# Patient Record
Sex: Male | Born: 1935 | Race: White | Hispanic: No | Smoking: Former smoker
Health system: Southern US, Community
[De-identification: ages and names within clinical notes are randomized; demographics above are authoritative.]

## PROBLEM LIST (undated history)

## (undated) DIAGNOSIS — E785 Hyperlipidemia, unspecified: Secondary | ICD-10-CM

## (undated) DIAGNOSIS — N400 Enlarged prostate without lower urinary tract symptoms: Secondary | ICD-10-CM

## (undated) DIAGNOSIS — D509 Iron deficiency anemia, unspecified: Secondary | ICD-10-CM

## (undated) DIAGNOSIS — E119 Type 2 diabetes mellitus without complications: Secondary | ICD-10-CM

## (undated) DIAGNOSIS — I4891 Unspecified atrial fibrillation: Secondary | ICD-10-CM

## (undated) DIAGNOSIS — E039 Hypothyroidism, unspecified: Secondary | ICD-10-CM

## (undated) DIAGNOSIS — I10 Essential (primary) hypertension: Secondary | ICD-10-CM

## (undated) HISTORY — PX: PACEMAKER PLACEMENT: SHX43

## (undated) HISTORY — PX: APPENDECTOMY: SHX54

## (undated) HISTORY — PX: CHOLECYSTECTOMY: SHX55

## (undated) HISTORY — PX: TONSILLECTOMY: SUR1361

---

## 2020-09-08 ENCOUNTER — Encounter: Payer: Self-pay | Admitting: Emergency Medicine

## 2020-09-08 ENCOUNTER — Emergency Department
Admission: EM | Admit: 2020-09-08 | Discharge: 2020-09-08 | Disposition: A | Discharge status: Home / Self Care | Payer: Medicare Other | Source: Home / Self Care | Attending: Emergency Medicine | Admitting: Emergency Medicine

## 2020-09-08 ENCOUNTER — Other Ambulatory Visit: Payer: Self-pay

## 2020-09-08 ENCOUNTER — Emergency Department: Payer: Medicare Other

## 2020-09-08 DIAGNOSIS — N99820 Postprocedural hemorrhage and hematoma of a genitourinary system organ or structure following a genitourinary system procedure: Secondary | ICD-10-CM | POA: Diagnosis not present

## 2020-09-08 DIAGNOSIS — R310 Gross hematuria: Secondary | ICD-10-CM | POA: Diagnosis not present

## 2020-09-08 DIAGNOSIS — R319 Hematuria, unspecified: Secondary | ICD-10-CM

## 2020-09-08 DIAGNOSIS — S91115A Laceration without foreign body of left lesser toe(s) without damage to nail, initial encounter: Secondary | ICD-10-CM | POA: Insufficient documentation

## 2020-09-08 DIAGNOSIS — Z8546 Personal history of malignant neoplasm of prostate: Secondary | ICD-10-CM | POA: Diagnosis not present

## 2020-09-08 DIAGNOSIS — E119 Type 2 diabetes mellitus without complications: Secondary | ICD-10-CM | POA: Insufficient documentation

## 2020-09-08 DIAGNOSIS — I10 Essential (primary) hypertension: Secondary | ICD-10-CM | POA: Insufficient documentation

## 2020-09-08 DIAGNOSIS — Z20822 Contact with and (suspected) exposure to covid-19: Secondary | ICD-10-CM | POA: Insufficient documentation

## 2020-09-08 DIAGNOSIS — N3090 Cystitis, unspecified without hematuria: Secondary | ICD-10-CM | POA: Insufficient documentation

## 2020-09-08 DIAGNOSIS — Z7982 Long term (current) use of aspirin: Secondary | ICD-10-CM | POA: Insufficient documentation

## 2020-09-08 DIAGNOSIS — D62 Acute posthemorrhagic anemia: Secondary | ICD-10-CM | POA: Diagnosis not present

## 2020-09-08 DIAGNOSIS — Z7901 Long term (current) use of anticoagulants: Secondary | ICD-10-CM | POA: Insufficient documentation

## 2020-09-08 DIAGNOSIS — W228XXA Striking against or struck by other objects, initial encounter: Secondary | ICD-10-CM | POA: Insufficient documentation

## 2020-09-08 DIAGNOSIS — S91312A Laceration without foreign body, left foot, initial encounter: Secondary | ICD-10-CM

## 2020-09-08 DIAGNOSIS — Z8679 Personal history of other diseases of the circulatory system: Secondary | ICD-10-CM | POA: Insufficient documentation

## 2020-09-08 HISTORY — DX: Type 2 diabetes mellitus without complications: E11.9

## 2020-09-08 HISTORY — DX: Essential (primary) hypertension: I10

## 2020-09-08 LAB — C-REACTIVE PROTEIN: CRP: 0.5 mg/dL (ref <1.0)

## 2020-09-08 LAB — RESP PANEL BY RT-PCR (FLU A&B, COVID) ARPGX2
Influenza A by PCR: NEGATIVE
Influenza B by PCR: NEGATIVE
SARS Coronavirus 2 by RT PCR: NEGATIVE

## 2020-09-08 LAB — COMPREHENSIVE METABOLIC PANEL
ALT: 17 U/L (ref 0–44)
AST: 20 U/L (ref 15–41)
Albumin: 3.5 g/dL (ref 3.5–5.0)
Alkaline Phosphatase: 73 U/L (ref 38–126)
Anion gap: 9 (ref 5–15)
BUN: 21 mg/dL (ref 8–23)
CO2: 26 mmol/L (ref 22–32)
Calcium: 9.2 mg/dL (ref 8.9–10.3)
Chloride: 103 mmol/L (ref 98–111)
Creatinine, Ser: 0.98 mg/dL (ref 0.61–1.24)
GFR, Estimated: >60 mL/min (ref >60)
Glucose, Bld: 180 mg/dL — ABNORMAL HIGH (ref 70–99)
Potassium: 4.5 mmol/L (ref 3.5–5.1)
Sodium: 138 mmol/L (ref 135–145)
Total Bilirubin: 0.5 mg/dL (ref 0.3–1.2)
Total Protein: 6.8 g/dL (ref 6.5–8.1)

## 2020-09-08 LAB — CBC WITH DIFFERENTIAL/PLATELET
Abs Immature Granulocytes: 0.06 10*3/uL (ref 0.00–0.07)
Basophils Absolute: 0.1 10*3/uL (ref 0.0–0.1)
Basophils Relative: 1 %
Eosinophils Absolute: 0.2 10*3/uL (ref 0.0–0.5)
Eosinophils Relative: 1 %
HCT: 30 % — ABNORMAL LOW (ref 39.0–52.0)
Hemoglobin: 9.3 g/dL — ABNORMAL LOW (ref 13.0–17.0)
Immature Granulocytes: 1 %
Lymphocytes Relative: 6 %
Lymphs Abs: 0.7 10*3/uL (ref 0.7–4.0)
MCH: 27.8 pg (ref 26.0–34.0)
MCHC: 31 g/dL (ref 30.0–36.0)
MCV: 89.6 fL (ref 80.0–100.0)
Monocytes Absolute: 0.8 10*3/uL (ref 0.1–1.0)
Monocytes Relative: 7 %
Neutro Abs: 9.2 10*3/uL — ABNORMAL HIGH (ref 1.7–7.7)
Neutrophils Relative %: 84 %
Platelets: 272 10*3/uL (ref 150–400)
RBC: 3.35 MIL/uL — ABNORMAL LOW (ref 4.22–5.81)
RDW: 15.6 % — ABNORMAL HIGH (ref 11.5–15.5)
WBC: 10.9 10*3/uL — ABNORMAL HIGH (ref 4.0–10.5)
nRBC: 0 % (ref 0.0–0.2)

## 2020-09-08 LAB — URINALYSIS, COMPLETE (UACMP) WITH MICROSCOPIC
RBC / HPF: >50 RBC/hpf — ABNORMAL HIGH (ref 0–5)
Specific Gravity, Urine: 1.017 (ref 1.005–1.030)
Squamous Epithelial / HPF: NONE SEEN (ref 0–5)
WBC, UA: >50 WBC/hpf — ABNORMAL HIGH (ref 0–5)

## 2020-09-08 LAB — LACTIC ACID, PLASMA: Lactic Acid, Venous: 1.1 mmol/L (ref 0.5–1.9)

## 2020-09-08 LAB — SEDIMENTATION RATE: Sed Rate: 39 mm/hr — ABNORMAL HIGH (ref 0–16)

## 2020-09-08 LAB — PROTIME-INR
INR: 1.2 (ref 0.8–1.2)
Prothrombin Time: 15.1 seconds (ref 11.4–15.2)

## 2020-09-08 MED ORDER — CEFDINIR 300 MG PO CAPS: 300.0000 mg | ORAL_CAPSULE | Freq: Two times a day (BID) | ORAL | 0 refills | Status: DC

## 2020-09-08 NOTE — ED Triage Notes (Signed)
EMS brings pt in from [facility] for "infected left toe" since March; this morning he "kicked his walker and it began bleeding"

## 2020-09-08 NOTE — ED Notes (Signed)
Pt to ER with urinary catheter in place. Bright red blood in urine. Leg bag changed to large drainage bag, hanging from bedside.

## 2020-09-08 NOTE — ED Notes (Signed)
Spoke to Dr. Scotty Court, who Dr. Marisa Severin signed out to, about continued bright red urine. MD verbalized understanding. See orders.  States still okay to d/c.

## 2020-09-08 NOTE — Discharge Instructions (Addendum)
Your urinalysis suggests an underlying bladder infection.  We replaced your foley catheter, and you should start antibiotics for the next week to ensure that any infection is resolved.  We sent a urine culture to the lab to confirm this in the next few days.  Continue routine wound care of the left foot as you are doing.  Any small cut or laceration on the foot is at high risk of not healing well due to your vascular issues so you will need to keep a close eye on the wound.  Follow-up with your primary care doctor, vascular surgeon, and urologist within the next 1 to 2 weeks.  Return to the ER for new, worsening, or persistent bleeding from the foot, persistent blood in the urine, redness or pain going up the leg, fever chills, weakness, or any other new or worsening symptoms that concern you.

## 2020-09-08 NOTE — ED Triage Notes (Signed)
Pt reports the wound on his left foot is bleeding. Bleeding controlled at this time. Pt states he has a wound on his little toe left foot but he continues having issues with it. Pt also reports he needs his foley catheter replaced.   Pt unsure of allergies or past medical hx. Pt requesting that MD look at his [facility] chart to see them. Pt does take eloquis he thinks

## 2020-09-08 NOTE — ED Provider Notes (Signed)
[facility] Emergency Department Provider Note ____________________________________________   Event Date/Time   First MD Initiated Contact with Patient 09/08/20 1034     (approximate)  I have reviewed the triage vital signs and the nursing notes.   HISTORY  Chief Complaint Toe Injury    HPI [name] [name] is a 86 y.o. male with PMH as noted below as well as a history of peripheral artery disease who presents with bleeding from his left fourth toe after he bumped it into something.  The patient states that he has had a chronic wound on the fourth toe and chronic pain to the fifth toe.  He has been treated and followed extensively at [facility] for this.  He reports that he had an arterial blockage that required a vascular procedure, and has been on blood thinners, currently Eliquis and aspirin.  He reports some increased generalized bruising and hematuria but denies other abnormal bleeding.  The patient states that the fifth toe is sore but denies other acute pain.  In addition he reports that he has an indwelling Foley catheter which has recently become irritated over the last few days.  He previously had hematuria that had resolved, but it has now returned.  He denies any dysuria, abdominal pain, or fever.  Past Medical History:  Diagnosis Date  . Diabetes mellitus without complication (HCC)   . Hypertension     There are no problems to display for this patient.   History reviewed. No pertinent surgical history.  Prior to Admission medications   Medication Sig Start Date End Date Taking? Authorizing Provider  cefdinir (OMNICEF) 300 MG capsule Take 1 capsule (300 mg total) by mouth 2 (two) times daily. 09/08/20   Sharman Cheek, MD    Allergies Statins  No family history on file.  Social History Lives in assisted living  Review of Systems  Constitutional: No fever/chills. Eyes: No visual changes. ENT: No sore throat. Cardiovascular: Denies chest  pain. Respiratory: Denies shortness of breath. Gastrointestinal: No vomiting or diarrhea.  Genitourinary: Negative for dysuria.  Musculoskeletal: Negative for back pain.  Positive for left fourth and fifth toe pain. Skin: Negative for rash. Neurological: Negative for headaches, focal weakness or numbness.   ____________________________________________   PHYSICAL EXAM:  VITAL SIGNS: ED Triage Vitals  Enc Vitals Group     BP 09/08/20 0736 (!) 153/69     Pulse Rate 09/08/20 0736 (!) 59     Resp 09/08/20 0736 20     Temp 09/08/20 0738 (!) 97.4 F (36.3 C)     Temp Source 09/08/20 0738 Oral     SpO2 09/08/20 0718 99 %     Weight 09/08/20 0733 168 lb (76.2 kg)     Height 09/08/20 0733 5\' 11"  (1.803 m)     Head Circumference --      Peak Flow --      Pain Score 09/08/20 0733 0     Pain Loc --      Pain Edu? --      Excl. in GC? --     Constitutional: Alert and oriented. Well appearing for age and in no acute distress. Eyes: Conjunctivae are normal.  Head: Atraumatic. Nose: No congestion/rhinnorhea. Mouth/Throat: Mucous membranes are moist.   Neck: Normal range of motion.  Cardiovascular: Normal rate, regular rhythm.  Good peripheral circulation. Respiratory: Normal respiratory effort.  No retractions.  Gastrointestinal: No distention.  Musculoskeletal: No lower extremity edema.  Extremities warm and well perfused.  Superficial skin  avulsion just underneath the toenail of the left fourth toe with no active bleeding.  No other visible open wounds or lesions. Neurologic:  Normal speech and language. No gross focal neurologic deficits are appreciated.  Skin:  Skin is warm and dry. No rash noted. Psychiatric: Mood and affect are normal. Speech and behavior are normal.  ____________________________________________   LABS (all labs ordered are listed, but only abnormal results are displayed)  Labs Reviewed  COMPREHENSIVE METABOLIC PANEL - Abnormal; Notable for the following  components:      Result Value   Glucose, Bld 180 (*)    All other components within normal limits  CBC WITH DIFFERENTIAL/PLATELET - Abnormal; Notable for the following components:   WBC 10.9 (*)    RBC 3.35 (*)    Hemoglobin 9.3 (*)    HCT 30.0 (*)    RDW 15.6 (*)    Neutro Abs 9.2 (*)    All other components within normal limits  URINALYSIS, COMPLETE (UACMP) WITH MICROSCOPIC - Abnormal; Notable for the following components:   Color, Urine RED (*)    APPearance CLOUDY (*)    Glucose, UA   (*)    Value: TEST NOT REPORTED DUE TO COLOR INTERFERENCE OF URINE PIGMENT   Hgb urine dipstick   (*)    Value: TEST NOT REPORTED DUE TO COLOR INTERFERENCE OF URINE PIGMENT   Bilirubin Urine   (*)    Value: TEST NOT REPORTED DUE TO COLOR INTERFERENCE OF URINE PIGMENT   Ketones, ur   (*)    Value: TEST NOT REPORTED DUE TO COLOR INTERFERENCE OF URINE PIGMENT   Protein, ur   (*)    Value: TEST NOT REPORTED DUE TO COLOR INTERFERENCE OF URINE PIGMENT   Nitrite   (*)    Value: TEST NOT REPORTED DUE TO COLOR INTERFERENCE OF URINE PIGMENT   Leukocytes,Ua   (*)    Value: TEST NOT REPORTED DUE TO COLOR INTERFERENCE OF URINE PIGMENT   RBC / HPF >50 (*)    WBC, UA >50 (*)    Bacteria, UA MANY (*)    All other components within normal limits  RESP PANEL BY RT-PCR (FLU A&B, COVID) ARPGX2  CULTURE, BLOOD (ROUTINE X 2)  CULTURE, BLOOD (ROUTINE X 2)  URINE CULTURE  LACTIC ACID, PLASMA  PROTIME-INR  C-REACTIVE PROTEIN  LACTIC ACID, PLASMA  SEDIMENTATION RATE   ____________________________________________  EKG   ____________________________________________  RADIOLOGY  XR L foot: Erosion in the fifth proximal phalanx concerning for osteomyelitis  ____________________________________________   PROCEDURES  Procedure(s) performed: Yes  .Marland KitchenLaceration Repair  Date/Time: 09/08/2020 4:18 PM Performed by: Dionne Bucy, MD Authorized by: Dionne Bucy, MD   Consent:    Consent  obtained:  Verbal   Consent given by:  Patient   Risks discussed:  Infection, pain, retained foreign body, poor cosmetic result and poor wound healing Universal protocol:    Patient identity confirmed:  Verbally with patient Anesthesia:    Anesthesia method:  None Laceration details:    Location:  Toe   Toe location:  L fourth toe   Length (cm):  1   Depth (mm):  3 Exploration:    Hemostasis achieved with:  Direct pressure   Wound extent: no foreign bodies/material noted, no underlying fracture noted and no vascular damage noted     Contaminated: no   Treatment:    Area cleansed with:  Povidone-iodine   Amount of cleaning:  Standard Skin repair:    Repair method:  Tissue adhesive Approximation:    Approximation:  Close Repair type:    Repair type:  Simple Post-procedure details:    Dressing:  Sterile dressing   Procedure completion:  Tolerated well, no immediate complications    Critical Care performed: No ____________________________________________   INITIAL IMPRESSION / ASSESSMENT AND PLAN / ED COURSE  Pertinent labs & imaging results that were available during my care of the patient were reviewed by me and considered in my medical decision making (see chart for details).  86 year old male with PMH as noted above presents with bleeding from the left fourth toe after he bumped it into something.  In addition he has irritation to his Foley catheter.  The patient reports an extensive history of peripheral vascular disease and a wound to his left toes for which she [facility].  He states he was diagnosed with osteomyelitis and was on antibiotics for some time but these were discontinued.  He also describes what sounds like a vascular procedure due to an occlusion.  He is on Eliquis and aspirin.  I attempted to review the past medical records.  The patient has [facility] in [facility].  Unfortunately, I am unable to open Care Everywhere to  obtain any records from [facility].  He recently was transferred to an assisted living in [place] which is why he presented here [facility] than to [facility].  On exam the patient is overall very well-appearing for his age.  His vital signs are normal except for hypertension.  There is a small skin avulsion underneath the toenail of the left fourth toe which is the source of bleeding and it has now stopped.  There is no visible open wound to the fifth toe.  X-ray was obtained from triage and is concerning for osteomyelitis.  However, I have no baseline to compare to.  We will obtain a lab work-up.  If this is acute or recurrent osteomyelitis the patient likely will require admission.  Once the work-up is complete I will likely contact the [facility] transfer center to discuss his case with the provider and determine the need for admission; if he needs to be admitted it seems more appropriate for the patient to be admitted there where he has been extensively followed, and the patient agrees that this is his preference.  If [facility] is at capacity and he requires admission, he will be admitted here.  ----------------------------------------- 3:42 PM on 09/08/2020 -----------------------------------------  After some time, the past records from [facility] did appear in care everywhere.  I was able to review them.  He was admitted in early December and started on Eliquis at that time.  He developed urinary retention and a Foley catheter was placed.  He was considered for revascularization but did not end up having surgery.  He was initially treated with IV antibiotics for osteomyelitis although then ID was consulted and the recommendation was to stop antibiotics, because the osteomyelitis appeared chronic and definitive treatment is surgical.  X-ray on 12/2 demonstrated osseous resorption in the proximal distal phalanx of the fifth toe suggestive of osteomyelitis.  This is the same as the findings on the x-ray from today.   He was seen in  the ED for hematuria on 12/9.  The patient has declined further work-up for hematuria including cystoscopy.  The patient's lab work-up here is reassuring.  He has no leukocytosis and his lactate is normal.  CRP is also normal.  His hemoglobin is stable from last month.  Given that the x-ray finding today is chronic, the patient has no new symptoms related to possible osteomyelitis, and the lab work-up is reassuring, there is no indication for admission.  I applied Dermabond to the small new wound on the toe in order to control the mild bleeding.  This area has been redressed.  The Foley catheter was replaced.  The patient continues to have pinkish blood-tinged urine out of the new Foley catheter, however there is no frank blood with clots.  Given his disinclination to have further work-up of the hematuria, and the stable hemoglobin, there is no indication for further acute intervention.  Based on these findings, at this time there is overall no indication for admission.  I had an extensive discussion with the patient as well as with the daughter over the phone about the results of the current work-up and the plan of care.  They both feel comfortable with discharge.  I gave both thorough return precautions and they both expressed understanding.  I have verified which specialists he will need to follow-up with and when.  I also specifically advised the patient that any new wound such as one sustained today is at higher risk of poor healing and infection and he should keep a close eye on it.  They understand to return to the ED immediately for any new or worsening symptoms.   ____________________________________________   FINAL CLINICAL IMPRESSION(S) / ED DIAGNOSES  Final diagnoses:  Foot laceration, left, initial encounter  Hematuria, unspecified type  Cystitis      NEW MEDICATIONS STARTED DURING THIS VISIT:  New Prescriptions   CEFDINIR (OMNICEF) 300 MG CAPSULE    Take 1 capsule (300 mg  total) by mouth 2 (two) times daily.     Note:  This document was prepared using Dragon voice recognition software and may include unintentional dictation errors.    Dionne Bucy, MD 09/08/20 1620

## 2020-09-10 ENCOUNTER — Inpatient Hospital Stay: Payer: Medicare Other

## 2020-09-10 ENCOUNTER — Emergency Department: Payer: Medicare Other

## 2020-09-10 ENCOUNTER — Inpatient Hospital Stay
Admission: EM | Admit: 2020-09-10 | Discharge: 2020-09-13 | DRG: 920 | Disposition: A | Discharge status: Home Health | Payer: Medicare Other | Source: Skilled Nursing Facility | Attending: Family Medicine | Admitting: Family Medicine

## 2020-09-10 ENCOUNTER — Other Ambulatory Visit: Payer: Self-pay

## 2020-09-10 ENCOUNTER — Encounter: Payer: Self-pay | Admitting: Emergency Medicine

## 2020-09-10 DIAGNOSIS — Y846 Urinary catheterization as the cause of abnormal reaction of the patient, or of later complication, without mention of misadventure at the time of the procedure: Secondary | ICD-10-CM | POA: Diagnosis present

## 2020-09-10 DIAGNOSIS — Z7982 Long term (current) use of aspirin: Secondary | ICD-10-CM | POA: Diagnosis not present

## 2020-09-10 DIAGNOSIS — D62 Acute posthemorrhagic anemia: Secondary | ICD-10-CM

## 2020-09-10 DIAGNOSIS — M7989 Other specified soft tissue disorders: Secondary | ICD-10-CM

## 2020-09-10 DIAGNOSIS — Z79890 Hormone replacement therapy (postmenopausal): Secondary | ICD-10-CM | POA: Diagnosis not present

## 2020-09-10 DIAGNOSIS — Z66 Do not resuscitate: Secondary | ICD-10-CM | POA: Diagnosis present

## 2020-09-10 DIAGNOSIS — Z7902 Long term (current) use of antithrombotics/antiplatelets: Secondary | ICD-10-CM | POA: Diagnosis not present

## 2020-09-10 DIAGNOSIS — Z7984 Long term (current) use of oral hypoglycemic drugs: Secondary | ICD-10-CM

## 2020-09-10 DIAGNOSIS — Z79899 Other long term (current) drug therapy: Secondary | ICD-10-CM

## 2020-09-10 DIAGNOSIS — E1169 Type 2 diabetes mellitus with other specified complication: Secondary | ICD-10-CM | POA: Diagnosis present

## 2020-09-10 DIAGNOSIS — N1831 Chronic kidney disease, stage 3a: Secondary | ICD-10-CM | POA: Diagnosis present

## 2020-09-10 DIAGNOSIS — Z888 Allergy status to other drugs, medicaments and biological substances status: Secondary | ICD-10-CM

## 2020-09-10 DIAGNOSIS — I10 Essential (primary) hypertension: Secondary | ICD-10-CM | POA: Diagnosis present

## 2020-09-10 DIAGNOSIS — E119 Type 2 diabetes mellitus without complications: Secondary | ICD-10-CM

## 2020-09-10 DIAGNOSIS — R319 Hematuria, unspecified: Secondary | ICD-10-CM

## 2020-09-10 DIAGNOSIS — E1151 Type 2 diabetes mellitus with diabetic peripheral angiopathy without gangrene: Secondary | ICD-10-CM | POA: Diagnosis present

## 2020-09-10 DIAGNOSIS — Z8546 Personal history of malignant neoplasm of prostate: Secondary | ICD-10-CM

## 2020-09-10 DIAGNOSIS — I129 Hypertensive chronic kidney disease with stage 1 through stage 4 chronic kidney disease, or unspecified chronic kidney disease: Secondary | ICD-10-CM | POA: Diagnosis present

## 2020-09-10 DIAGNOSIS — M86672 Other chronic osteomyelitis, left ankle and foot: Secondary | ICD-10-CM | POA: Diagnosis present

## 2020-09-10 DIAGNOSIS — R310 Gross hematuria: Secondary | ICD-10-CM | POA: Diagnosis present

## 2020-09-10 DIAGNOSIS — Z7901 Long term (current) use of anticoagulants: Secondary | ICD-10-CM

## 2020-09-10 DIAGNOSIS — Z20822 Contact with and (suspected) exposure to covid-19: Secondary | ICD-10-CM | POA: Diagnosis present

## 2020-09-10 DIAGNOSIS — I739 Peripheral vascular disease, unspecified: Secondary | ICD-10-CM | POA: Diagnosis present

## 2020-09-10 DIAGNOSIS — M86662 Other chronic osteomyelitis, left tibia and fibula: Secondary | ICD-10-CM | POA: Diagnosis present

## 2020-09-10 DIAGNOSIS — N99820 Postprocedural hemorrhage and hematoma of a genitourinary system organ or structure following a genitourinary system procedure: Secondary | ICD-10-CM | POA: Diagnosis present

## 2020-09-10 DIAGNOSIS — E039 Hypothyroidism, unspecified: Secondary | ICD-10-CM | POA: Diagnosis present

## 2020-09-10 DIAGNOSIS — E1122 Type 2 diabetes mellitus with diabetic chronic kidney disease: Secondary | ICD-10-CM | POA: Diagnosis present

## 2020-09-10 DIAGNOSIS — R911 Solitary pulmonary nodule: Secondary | ICD-10-CM | POA: Diagnosis present

## 2020-09-10 DIAGNOSIS — N401 Enlarged prostate with lower urinary tract symptoms: Secondary | ICD-10-CM | POA: Diagnosis present

## 2020-09-10 DIAGNOSIS — B9689 Other specified bacterial agents as the cause of diseases classified elsewhere: Secondary | ICD-10-CM | POA: Diagnosis present

## 2020-09-10 HISTORY — DX: Unspecified atrial fibrillation: I48.91

## 2020-09-10 HISTORY — DX: Hyperlipidemia, unspecified: E78.5

## 2020-09-10 LAB — COMPREHENSIVE METABOLIC PANEL
ALT: 13 U/L (ref 0–44)
AST: 18 U/L (ref 15–41)
Albumin: 3.1 g/dL — ABNORMAL LOW (ref 3.5–5.0)
Alkaline Phosphatase: 66 U/L (ref 38–126)
Anion gap: 8 (ref 5–15)
BUN: 33 mg/dL — ABNORMAL HIGH (ref 8–23)
CO2: 25 mmol/L (ref 22–32)
Calcium: 8.7 mg/dL — ABNORMAL LOW (ref 8.9–10.3)
Chloride: 102 mmol/L (ref 98–111)
Creatinine, Ser: 1.07 mg/dL (ref 0.61–1.24)
GFR, Estimated: >60 mL/min (ref >60)
Glucose, Bld: 171 mg/dL — ABNORMAL HIGH (ref 70–99)
Potassium: 4.3 mmol/L (ref 3.5–5.1)
Sodium: 135 mmol/L (ref 135–145)
Total Bilirubin: 0.5 mg/dL (ref 0.3–1.2)
Total Protein: 6.4 g/dL — ABNORMAL LOW (ref 6.5–8.1)

## 2020-09-10 LAB — CBC WITH DIFFERENTIAL/PLATELET
Abs Immature Granulocytes: 0.04 10*3/uL (ref 0.00–0.07)
Basophils Absolute: 0.1 10*3/uL (ref 0.0–0.1)
Basophils Relative: 1 %
Eosinophils Absolute: 0.1 10*3/uL (ref 0.0–0.5)
Eosinophils Relative: 2 %
HCT: 26 % — ABNORMAL LOW (ref 39.0–52.0)
Hemoglobin: 8.3 g/dL — ABNORMAL LOW (ref 13.0–17.0)
Immature Granulocytes: 0 %
Lymphocytes Relative: 6 %
Lymphs Abs: 0.6 10*3/uL — ABNORMAL LOW (ref 0.7–4.0)
MCH: 28.2 pg (ref 26.0–34.0)
MCHC: 31.9 g/dL (ref 30.0–36.0)
MCV: 88.4 fL (ref 80.0–100.0)
Monocytes Absolute: 0.8 10*3/uL (ref 0.1–1.0)
Monocytes Relative: 8 %
Neutro Abs: 8 10*3/uL — ABNORMAL HIGH (ref 1.7–7.7)
Neutrophils Relative %: 83 %
Platelets: 230 10*3/uL (ref 150–400)
RBC: 2.94 MIL/uL — ABNORMAL LOW (ref 4.22–5.81)
RDW: 15.8 % — ABNORMAL HIGH (ref 11.5–15.5)
WBC: 9.5 10*3/uL (ref 4.0–10.5)
nRBC: 0 % (ref 0.0–0.2)

## 2020-09-10 LAB — URINALYSIS, COMPLETE (UACMP) WITH MICROSCOPIC
Bilirubin Urine: NEGATIVE
Glucose, UA: 50 mg/dL — AB
Ketones, ur: NEGATIVE mg/dL
Leukocytes,Ua: NEGATIVE
Nitrite: NEGATIVE
Protein, ur: 100 mg/dL — AB
RBC / HPF: >50 RBC/hpf — ABNORMAL HIGH (ref 0–5)
Specific Gravity, Urine: 1.016 (ref 1.005–1.030)
Squamous Epithelial / HPF: NONE SEEN (ref 0–5)
pH: 5 (ref 5.0–8.0)

## 2020-09-10 LAB — GLUCOSE, CAPILLARY
Glucose-Capillary: 131 mg/dL — ABNORMAL HIGH (ref 70–99)
Glucose-Capillary: 182 mg/dL — ABNORMAL HIGH (ref 70–99)

## 2020-09-10 LAB — RESP PANEL BY RT-PCR (FLU A&B, COVID) ARPGX2
Influenza A by PCR: NEGATIVE
Influenza B by PCR: NEGATIVE
SARS Coronavirus 2 by RT PCR: NEGATIVE

## 2020-09-10 LAB — MRSA PCR SCREENING: MRSA by PCR: NEGATIVE

## 2020-09-10 MED ORDER — FERROUS SULFATE 325 (65 FE) MG PO TABS
325.0000 mg | ORAL_TABLET | ORAL | Status: DC
  Administered 2020-09-12: 09:00:00 325 mg via ORAL
  Filled 2020-09-10 (×3): qty 1

## 2020-09-10 MED ORDER — ACETAMINOPHEN 500 MG PO TABS: 500.0000 mg | ORAL_TABLET | Freq: Three times a day (TID) | ORAL | Status: DC | PRN

## 2020-09-10 MED ORDER — ONDANSETRON HCL 4 MG/2ML IJ SOLN: 4.0000 mg | Freq: Four times a day (QID) | INTRAMUSCULAR | Status: DC | PRN

## 2020-09-10 MED ORDER — SODIUM CHLORIDE 0.9 % IR SOLN
3000.0000 mL | Status: DC
  Administered 2020-09-10: 10:00:00 3000 mL

## 2020-09-10 MED ORDER — EZETIMIBE 10 MG PO TABS
10.0000 mg | ORAL_TABLET | Freq: Every day | ORAL | Status: DC
  Administered 2020-09-11 – 2020-09-13 (×3): 10 mg via ORAL
  Filled 2020-09-10 (×4): qty 1

## 2020-09-10 MED ORDER — TAMSULOSIN HCL 0.4 MG PO CAPS
0.4000 mg | ORAL_CAPSULE | Freq: Every day | ORAL | Status: DC
  Administered 2020-09-10 – 2020-09-12 (×3): 0.4 mg via ORAL
  Filled 2020-09-10 (×3): qty 1

## 2020-09-10 MED ORDER — SODIUM CHLORIDE 0.9% FLUSH: 3.0000 mL | INTRAVENOUS | Status: DC | PRN

## 2020-09-10 MED ORDER — LEVOTHYROXINE SODIUM 50 MCG PO TABS
125.0000 ug | ORAL_TABLET | Freq: Every day | ORAL | Status: DC
  Administered 2020-09-11 – 2020-09-13 (×3): 125 ug via ORAL
  Filled 2020-09-10 (×3): qty 1

## 2020-09-10 MED ORDER — FINASTERIDE 5 MG PO TABS
5.0000 mg | ORAL_TABLET | Freq: Every day | ORAL | Status: DC
  Administered 2020-09-11 – 2020-09-13 (×3): 5 mg via ORAL
  Filled 2020-09-10 (×3): qty 1

## 2020-09-10 MED ORDER — IOHEXOL 300 MG/ML  SOLN
125.0000 mL | Freq: Once | INTRAMUSCULAR | Status: AC | PRN
  Administered 2020-09-10: 14:00:00 125 mL via INTRAVENOUS

## 2020-09-10 MED ORDER — DILTIAZEM HCL ER BEADS 180 MG PO CP24: 180.0000 mg | ORAL_CAPSULE | Freq: Every day | ORAL | Status: DC

## 2020-09-10 MED ORDER — SODIUM CHLORIDE 0.9 % IV SOLN: 250.0000 mL | INTRAVENOUS | Status: DC | PRN

## 2020-09-10 MED ORDER — SODIUM CHLORIDE 0.9% FLUSH
3.0000 mL | Freq: Two times a day (BID) | INTRAVENOUS | Status: DC
  Administered 2020-09-10 – 2020-09-13 (×6): 3 mL via INTRAVENOUS

## 2020-09-10 MED ORDER — ADULT MULTIVITAMIN W/MINERALS CH
1.0000 | ORAL_TABLET | Freq: Every day | ORAL | Status: DC
  Administered 2020-09-11 – 2020-09-13 (×3): 1 via ORAL
  Filled 2020-09-10 (×3): qty 1

## 2020-09-10 MED ORDER — INSULIN ASPART 100 UNIT/ML ~~LOC~~ SOLN
0.0000 [IU] | Freq: Three times a day (TID) | SUBCUTANEOUS | Status: DC
  Administered 2020-09-11: 15:00:00 8 [IU] via SUBCUTANEOUS
  Administered 2020-09-12: 17:00:00 3 [IU] via SUBCUTANEOUS
  Administered 2020-09-12: 13:00:00 8 [IU] via SUBCUTANEOUS
  Administered 2020-09-13: 09:00:00 2 [IU] via SUBCUTANEOUS
  Administered 2020-09-13: 13:00:00 5 [IU] via SUBCUTANEOUS
  Filled 2020-09-10 (×6): qty 1

## 2020-09-10 MED ORDER — SODIUM CHLORIDE 0.9 % IR SOLN
3000.0000 mL | Status: DC
  Administered 2020-09-10: 14:00:00 3000 mL

## 2020-09-10 MED ORDER — VITAMIN D3 25 MCG (1000 UNIT) PO TABS
ORAL_TABLET | Freq: Every day | ORAL | Status: DC
  Administered 2020-09-11: 10:00:00 1000 [IU] via ORAL
  Administered 2020-09-12: 09:00:00 2000 [IU] via ORAL
  Administered 2020-09-13: 09:00:00 1000 [IU] via ORAL
  Filled 2020-09-10 (×2): qty 1
  Filled 2020-09-10 (×2): qty 2
  Filled 2020-09-10: qty 1
  Filled 2020-09-10: qty 2

## 2020-09-10 MED ORDER — VITAMIN B-12 1000 MCG PO TABS
1000.0000 ug | ORAL_TABLET | Freq: Every day | ORAL | Status: DC
  Administered 2020-09-11 – 2020-09-13 (×3): 1000 ug via ORAL
  Filled 2020-09-10 (×3): qty 1

## 2020-09-10 MED ORDER — ONDANSETRON HCL 4 MG PO TABS: 4.0000 mg | ORAL_TABLET | Freq: Four times a day (QID) | ORAL | Status: DC | PRN

## 2020-09-10 MED ORDER — DILTIAZEM HCL ER COATED BEADS 180 MG PO CP24
180.0000 mg | ORAL_CAPSULE | Freq: Every day | ORAL | Status: DC
  Administered 2020-09-11 – 2020-09-13 (×3): 180 mg via ORAL
  Filled 2020-09-10 (×3): qty 1

## 2020-09-10 NOTE — ED Notes (Signed)
Pt transported to CT at this time.

## 2020-09-10 NOTE — ED Notes (Signed)
This RN to reassess patient's urine. Pt urine is clearer than previous urine collection, small blood clots noted in the catheter bag. Pt states he feel much better.

## 2020-09-10 NOTE — ED Notes (Signed)
Pt being transferred to rm [id number] at this time via stretcher.

## 2020-09-10 NOTE — H&P (Signed)
History and Physical    [name] [name] NUU:[id number] DOB: 1928-08-19 DOA: 09/10/2020  PCP: Zenaida Niece, MD   Patient coming from: Assisted living facility ([facility])  I have personally briefly reviewed patient's old medical records in [facility]  Chief Complaint: Bloody urine  HPI: [name] [name] is a 86 y.o. male with medical history significant for peripheral arterial disease with chronic wound involving the left fourth toe for which he is on antibiotic therapy, history of  indwelling Foley catheter for urinary retention, diabetes mellitus and hypertension who was sent to the emergency room from the assisted living facility for evaluation of hematuria.  Patient states that he was seen in the emergency room 2 days prior to this hospitalization due to malfunctioning of his Foley catheter.  He states that he was leaking around the sides and had some blood in his urine.  His Foley catheter was replaced on 12/23 at [facility] and the patient was discharged back to his assisted living facility because he declined to have further work-up for his hematuria.  He was scheduled to follow-up with urology as an outpatient. Patient states he did okay for the last 24 hours and on the morning of his admission he developed hematuria again.  He denies having any trauma or pain. He denies feeling dizzy, no lightheadedness, no chest pain, no shortness of breath, no nausea, no vomiting, no diaphoresis, no palpitations, no abdominal pain, no constipation, no diarrhea, no fever, no chills, no cough, no headache. Labs show sodium 135, potassium 4.3, chloride 102, bicarb 25, glucose 171, BUN 33, creatinine 1.07, calcium 8.7, alkaline phosphatase 66, albumin 3.1, AST 18, ALT 13, total protein 6.4, white count 9.5, hemoglobin 8.3, hematocrit 26, MCV 88.4, RDW 15.8, platelet count 230 Respiratory viral panel is negative CT scan of the abdomen done with and without contrast shows hemorrhage within the bladder.  Bladder wall  thickening and mucosal hyperenhancement suspicious for cystitis.  A component of the wall thickening could be related to outlet obstruction in this patient with prostatomegaly. Posterior right middle lobe groundglass nodule of 2.4 cm.  This warrants follow-up chest CT in 6 months if patient is a therapy candidate.  Coronary artery disease and emphysema. Bilateral inguinal hernias. Respiratory viral panel is negative Left foot x-ray shows erosive changes in the distal aspect of the fifth proximal phalanx consistent with osteomyelitis.  ED Course: Patient is a 86 year old Caucasian male who resides in an assisted living facility and was brought to the ER for evaluation of gross hematuria.  Patient is on dual antiplatelet therapy. He is noted to have a 1 g drop in his H&H over the last 48 hours (9.3 >> 8.3).  He will be admitted to the hospital for further evaluation.  Review of Systems: As per HPI otherwise all systems reviewed and negative.    Past Medical History:  Diagnosis Date  . Diabetes mellitus without complication (HCC)   . Hypertension     History reviewed. No pertinent surgical history.   reports that he has quit smoking. He has never used smokeless tobacco. No history on file for alcohol use and drug use.  Allergies  Allergen Reactions  . Statins Palpitations    History reviewed. No pertinent family history.   Prior to Admission medications   Medication Sig Start Date End Date Taking? Authorizing Provider  acetaminophen (TYLENOL) 500 MG tablet Take 500 mg by mouth every 8 (eight) hours as needed.   Yes [provider]  ASPIRIN LOW DOSE 81  MG EC tablet Take 81 mg by mouth daily. 08/03/20  Yes [provider]  cefdinir (OMNICEF) 300 MG capsule Take 1 capsule (300 mg total) by mouth 2 (two) times daily. 09/08/20  Yes Sharman CheekStafford, Phillip, MD  Cholecalciferol 50 MCG (2000 UT) TABS Take 1,000 mg by mouth daily. 08/24/20  Yes [provider]  clopidogrel  (PLAVIX) 75 MG tablet Take 75 mg by mouth daily. 08/09/20  Yes [provider]  cyanocobalamin 1000 MCG tablet Take 1,000 mcg by mouth daily.   Yes [provider]  diltiazem (TIAZAC) 180 MG 24 hr capsule Take 180 mg by mouth daily. 09/07/20  Yes [provider]  ELIQUIS 5 MG TABS tablet Take 5 mg by mouth 2 (two) times daily. 08/24/20  Yes [provider]  ezetimibe (ZETIA) 10 MG tablet Take 10 mg by mouth daily. 08/09/20  Yes [provider]  FEROSUL 325 (65 Fe) MG tablet Take 1 tablet by mouth every other day. 08/07/20  Yes [provider]  finasteride (PROSCAR) 5 MG tablet Take 5 mg by mouth daily. 08/24/20  Yes [provider]  JANUVIA 50 MG tablet Take 50 mg by mouth daily. 09/06/20  Yes [provider]  levothyroxine (SYNTHROID) 125 MCG tablet Take 125 mcg by mouth daily before breakfast.   Yes [provider]  mineral oil-hydrophilic petrolatum (AQUAPHOR) ointment Apply 1 application topically daily. To feet after cleaning   Yes [provider]  Multiple Vitamin (MULTIVITAMIN WITH MINERALS) TABS tablet Take 1 tablet by mouth daily.   Yes [provider]  tamsulosin (FLOMAX) 0.4 MG CAPS capsule Take 0.4 mg by mouth at bedtime. 08/09/20  Yes [provider]    Physical Exam: Vitals:   09/10/20 1100 09/10/20 1130 09/10/20 1300 09/10/20 1557  BP: (!) 140/59 (!) 146/57 (!) 135/56 140/66  Pulse: 61 (!) 59 88 (!) 59  Resp:  18 20 17   Temp:    97.7 F (36.5 C)  TempSrc:    Oral  SpO2: 99% 99% 99% 100%  Weight:      Height:         Vitals:   09/10/20 1100 09/10/20 1130 09/10/20 1300 09/10/20 1557  BP: (!) 140/59 (!) 146/57 (!) 135/56 140/66  Pulse: 61 (!) 59 88 (!) 59  Resp:  18 20 17   Temp:    97.7 F (36.5 C)  TempSrc:    Oral  SpO2: 99% 99% 99% 100%  Weight:      Height:        Constitutional: NAD, alert and oriented x 3.  Appears comfortable and in no distress Eyes:  PERRL, lids and conjunctivae normal ENMT: Mucous membranes are moist.  Neck: normal, supple, no masses, no thyromegaly Respiratory: clear to auscultation bilaterally, no wheezing, no crackles. Normal respiratory effort. No accessory muscle use.  Cardiovascular: Regular rate and rhythm, no murmurs / rubs / gallops.  Left leg swelling compared to the right leg. 2+ pedal pulses. No carotid bruits.  Abdomen: no tenderness, no masses palpated. No hepatosplenomegaly. Bowel sounds positive.  Foley catheter in place with bloody urine Musculoskeletal: no clubbing / cyanosis.  Left fifth toe with crusted blood Skin: no rashes, lesions, ulcers.  Ecchymosis over both forearms Neurologic: No gross focal neurologic deficit. Psychiatric: Normal mood and affect.   Labs on Admission: I have personally reviewed following labs and imaging studies  CBC: Recent Labs  Lab 09/08/20 1103 09/10/20 0847  WBC 10.9* 9.5  NEUTROABS 9.2* 8.0*  HGB  9.3* 8.3*  HCT 30.0* 26.0*  MCV 89.6 88.4  PLT 272 230   Basic Metabolic Panel: Recent Labs  Lab 09/08/20 1103 09/10/20 0847  NA 138 135  K 4.5 4.3  CL 103 102  CO2 26 25  GLUCOSE 180* 171*  BUN 21 33*  CREATININE 0.98 1.07  CALCIUM 9.2 8.7*   GFR: Estimated Creatinine Clearance: 48.5 mL/min (by C-G formula based on SCr of 1.07 mg/dL). Liver Function Tests: Recent Labs  Lab 09/08/20 1103 09/10/20 0847  AST 20 18  ALT 17 13  ALKPHOS 73 66  BILITOT 0.5 0.5  PROT 6.8 6.4*  ALBUMIN 3.5 3.1*   No results for input(s): LIPASE, AMYLASE in the last 168 hours. No results for input(s): AMMONIA in the last 168 hours. Coagulation Profile: Recent Labs  Lab 09/08/20 1103  INR 1.2   Cardiac Enzymes: No results for input(s): CKTOTAL, CKMB, CKMBINDEX, TROPONINI in the last 168 hours. BNP (last 3 results) No results for input(s): PROBNP in the last 8760 hours. HbA1C: No results for input(s): HGBA1C in the last 72 hours. CBG: No results for input(s):  GLUCAP in the last 168 hours. Lipid Profile: No results for input(s): CHOL, HDL, LDLCALC, TRIG, CHOLHDL, LDLDIRECT in the last 72 hours. Thyroid Function Tests: No results for input(s): TSH, T4TOTAL, FREET4, T3FREE, THYROIDAB in the last 72 hours. Anemia Panel: No results for input(s): VITAMINB12, FOLATE, FERRITIN, TIBC, IRON, RETICCTPCT in the last 72 hours. Urine analysis:    Component Value Date/Time   COLORURINE YELLOW (A) 09/10/2020 0940   APPEARANCEUR HAZY (A) 09/10/2020 0940   LABSPEC 1.016 09/10/2020 0940   PHURINE 5.0 09/10/2020 0940   GLUCOSEU 50 (A) 09/10/2020 0940   HGBUR LARGE (A) 09/10/2020 0940   BILIRUBINUR NEGATIVE 09/10/2020 0940   KETONESUR NEGATIVE 09/10/2020 0940   PROTEINUR 100 (A) 09/10/2020 0940   NITRITE NEGATIVE 09/10/2020 0940   LEUKOCYTESUR NEGATIVE 09/10/2020 0940    Radiological Exams on Admission: CT HEMATURIA WORKUP  Result Date: 09/10/2020 CLINICAL DATA:  Hematuria and dysuria. EXAM: CT ABDOMEN AND PELVIS WITHOUT AND WITH CONTRAST TECHNIQUE: Multidetector CT imaging of the abdomen and pelvis was performed following the standard protocol before and following the bolus administration of intravenous contrast. CONTRAST:  OMNIPAQUE IOHEXOL 300 MG/ML  SOLN COMPARISON:  None. FINDINGS: Lower chest: Emphysema. Ground-glass nodule in the posterior right middle lobe of 2.4 cm on 04/05. Mild cardiomegaly. Pacer. Coronary artery atherosclerosis. Tiny hiatal hernia. Trace left pleural fluid. Hepatobiliary: Focal steatosis adjacent the falciform ligament. Areas of hypoattenuation along the capsule of the right lobe of the liver measure less than 1 cm and may also represent focal steatosis, including on 31/7. Cholecystectomy, without biliary ductal dilatation. Pancreas: Normal, without mass or ductal dilatation. Spleen: Normal in size, without focal abnormality. Adrenals/Urinary Tract: Normal adrenal glands. Left renal vascular calcifications. No renal calculi or  hydronephrosis. No hydroureter or ureteric calculi. No bladder calculi. Interpolar left renal 2.5 cm cyst. Other bilateral too small to characterize renal lesions. Moderate renal collecting system opacification on delayed images. Portions of both ureters are suboptimally opacified on delayed images. No collecting system or ureteric filling defect. Foley catheter within the urinary bladder. Hyperattenuating material within consistent with hemorrhage on precontrast image 68/2. The bladder wall is mildly thickened and hyperenhancing after contrast. No well-defined bladder mass. No other filling defect on delayed images, somewhat limited by lack of bladder distension. Stomach/Bowel: Normal remainder of the stomach. Scattered colonic diverticula. Nonobstructive distal and terminal ileum position within the  right inguinal canal on 72/7. periampullary duodenal diverticulum. Otherwise normal small bowel. Vascular/Lymphatic: Advanced aortic and branch vessel atherosclerosis. No abdominopelvic adenopathy. Reproductive: Moderate prostatomegaly. Other: No significant free fluid. Fat containing left inguinal hernia. Subcutaneous edema about the left proximal thigh, including on 103/7. Musculoskeletal: Advanced lumbosacral spondylosis with mild convex right lumbar spine curvature. IMPRESSION: 1. Hemorrhage within the bladder. Bladder wall thickening and mucosal hyperenhancement, suspicious for cystitis. A component of the wall thickening could be related to outlet obstruction in this patient with prostatomegaly. 2. No upper tract explanation for hematuria. 3. Posterior right middle lobe ground-glass nodule of 2.4 cm. Per consensus criteria, this warrants follow-up with chest CT at 6 months (if this 86 year old patient is a therapy candidate). This recommendation follows the consensus statement: Guidelines for Management of Incidental Pulmonary Nodules Detected on CT Images:From the Fleischner Society 2017; published online before  print (10.1148/radiol.[id number]). 4. Aortic atherosclerosis (ICD10-I70.0), coronary artery atherosclerosis and emphysema (ICD10-J43.9). 5.  Tiny hiatal hernia. 6. Bilateral inguinal hernias. The right-sided inguinal hernia contains nonobstructive small bowel. 7. Nonspecific subcutaneous edema about the left proximal thigh. Electronically Signed   By: Jeronimo Greaves M.D.   On: 09/10/2020 14:19    EKG: Independently reviewed.   Assessment/Plan Principal Problem:   Gross hematuria Active Problems:   Diabetes mellitus without complication (HCC)   Hypertension   PAD (peripheral artery disease) (HCC)   Osteomyelitis, chronic, lower leg, left (HCC)   Hypothyroid   Lung nodule seen on imaging study     Gross hematuria Unclear etiology CT scan of abdomen and pelvis done with and without contrast shows possible cystitis Patient is on Eliquis, aspirin and Plavix and is noted to have a 1 g drop in his H&H over the last 48 hours (9.3g/dl >> 9.5G/LO) We will hold anticoagulant for the next 24 hours and repeat H&H Appreciate urology input Follow-up results of urine culture CBI until urine is clear    Diabetes mellitus with complications of stage III chronic kidney disease Maintain consistent carbohydrate diet Glycemic control with sliding scale insulin    BPH Continue Flomax and finasteride    Hypertension Continue diltiazem    Hypothyroidism Continue Synthroid    Lung nodule Incidental finding Noted in the posterior right middle lobe Repeat imaging has been recommended in the next 6 months if patient is deemed a therapy candidate   Chronic left fifth toe osteomyelitis Patient follows up at [facility] and from chart review he was initially treated with IV antibiotics for osteomyelitis although then ID was consulted and the recommendation was to stop antibiotics, because the osteomyelitis appeared chronic and definitive treatment is surgical.   X-ray on 12/2 demonstrated osseous  resorption in the proximal distal phalanx of the fifth toe suggestive of osteomyelitis.  This is the same as the findings on the x-ray from 12/23 We will consult podiatry   DVT prophylaxis: SCD Code Status: DO NOT RESUSCITATE Family Communication: Greater than 50% of time was spent discussing patient's condition and plan of care with him at the bedside.  CODE STATUS was discussed and he is a DO NOT RESUSCITATE.  He requested to discuss his condition and plan of care with his daughter [name] over the phone.   Called patient's daughter Ludwig Clarks @ [phone] and phone is not in service. Disposition Plan: Back to previous home environment Consults called: Podiatry/ urology    Lucile Shutters MD [facility]     09/10/2020, 4:24 PM

## 2020-09-10 NOTE — Consult Note (Addendum)
Reason for Consult: Osteomyelitis left fifth toe. Referring Physician: Ramel Tobon is an 86 y.o. male.  HPI: This is a 86 year old male who presented to the hospital a couple of days ago due to some bleeding in his catheter.  Was noted to have a sore on his fifth toe.  Patient states that he has been dealing with this for quite some time and has been followed by the wound care center as well as vascular surgery and podiatry.  Patient states he was told about a month ago that there may be infection in the bone but they wanted to wait and give this a little bit more time.  States he has not followed up with his vascular surgeon since his recent revascularization.  Past Medical History:  Diagnosis Date  . Diabetes mellitus without complication (HCC)   . Hypertension     History reviewed. No pertinent surgical history.  History reviewed. No pertinent family history.  Social History:  reports that he has quit smoking. He has never used smokeless tobacco. No history on file for alcohol use and drug use.  Allergies:  Allergies  Allergen Reactions  . Statins Palpitations    Medications:  Scheduled: . [START ON 09/11/2020] cholecalciferol   Oral Daily  . [START ON 09/11/2020] diltiazem  180 mg Oral Daily  . ezetimibe  10 mg Oral Daily  . ferrous sulfate  325 mg Oral QODAY  . [START ON 09/11/2020] finasteride  5 mg Oral Daily  . [START ON 09/11/2020] insulin aspart  0-15 Units Subcutaneous TID WC  . [START ON 09/11/2020] levothyroxine  125 mcg Oral QAC breakfast  . [START ON 09/11/2020] multivitamin with minerals  1 tablet Oral Daily  . sodium chloride flush  3 mL Intravenous Q12H  . tamsulosin  0.4 mg Oral QHS  . [START ON 09/11/2020] cyanocobalamin  1,000 mcg Oral Daily    Results for orders placed or performed during the hospital encounter of 09/10/20 (from the past 48 hour(s))  CBC with Differential     Status: Abnormal   Collection Time: 09/10/20  8:47 AM  Result Value  Ref Range   WBC 9.5 4.0 - 10.5 K/uL   RBC 2.94 (L) 4.22 - 5.81 MIL/uL   Hemoglobin 8.3 (L) 13.0 - 17.0 g/dL   HCT 96.0 (L) 45.4 - 09.8 %   MCV 88.4 80.0 - 100.0 fL   MCH 28.2 26.0 - 34.0 pg   MCHC 31.9 30.0 - 36.0 g/dL   RDW 11.9 (H) 14.7 - 82.9 %   Platelets 230 150 - 400 K/uL   nRBC 0.0 0.0 - 0.2 %   Neutrophils Relative % 83 %   Neutro Abs 8.0 (H) 1.7 - 7.7 K/uL   Lymphocytes Relative 6 %   Lymphs Abs 0.6 (L) 0.7 - 4.0 K/uL   Monocytes Relative 8 %   Monocytes Absolute 0.8 0.1 - 1.0 K/uL   Eosinophils Relative 2 %   Eosinophils Absolute 0.1 0.0 - 0.5 K/uL   Basophils Relative 1 %   Basophils Absolute 0.1 0.0 - 0.1 K/uL   Immature Granulocytes 0 %   Abs Immature Granulocytes 0.04 0.00 - 0.07 K/uL    Comment: Performed at [facility], [address] [place], [place] [address]  Comprehensive metabolic panel     Status: Abnormal   Collection Time: 09/10/20  8:47 AM  Result Value Ref Range   Sodium 135 135 - 145 mmol/L   Potassium 4.3 3.5 - 5.1 mmol/L  Chloride 102 98 - 111 mmol/L   CO2 25 22 - 32 mmol/L   Glucose, Bld 171 (H) 70 - 99 mg/dL    Comment: Glucose reference range applies only to samples taken after fasting for at least 8 hours.   BUN 33 (H) 8 - 23 mg/dL   Creatinine, Ser 6.21 0.61 - 1.24 mg/dL   Calcium 8.7 (L) 8.9 - 10.3 mg/dL   Total Protein 6.4 (L) 6.5 - 8.1 g/dL   Albumin 3.1 (L) 3.5 - 5.0 g/dL   AST 18 15 - 41 U/L   ALT 13 0 - 44 U/L   Alkaline Phosphatase 66 38 - 126 U/L   Total Bilirubin 0.5 0.3 - 1.2 mg/dL   GFR, Estimated >30 >86 mL/min    Comment: (NOTE) Calculated using the CKD-EPI Creatinine Equation (2021)    Anion gap 8 5 - 15    Comment: Performed at [facility], [address] [place], [place] [address]  Urinalysis, Complete w Microscopic Urine, Catheterized     Status: Abnormal   Collection Time: 09/10/20  9:40 AM  Result Value Ref Range   Color, Urine YELLOW (A) YELLOW   APPearance HAZY (A) CLEAR   Specific  Gravity, Urine 1.016 1.005 - 1.030   pH 5.0 5.0 - 8.0   Glucose, UA 50 (A) NEGATIVE mg/dL   Hgb urine dipstick LARGE (A) NEGATIVE   Bilirubin Urine NEGATIVE NEGATIVE   Ketones, ur NEGATIVE NEGATIVE mg/dL   Protein, ur 962 (A) NEGATIVE mg/dL   Nitrite NEGATIVE NEGATIVE   Leukocytes,Ua NEGATIVE NEGATIVE   RBC / HPF >50 (H) 0 - 5 RBC/hpf   WBC, UA 11-20 0 - 5 WBC/hpf   Bacteria, UA RARE (A) NONE SEEN   Squamous Epithelial / LPF NONE SEEN 0 - 5   Budding Yeast PRESENT    Amorphous Crystal PRESENT     Comment: Performed at [facility], [address] [place], [place] [address]  Resp Panel by RT-PCR (Flu A&B, Covid) Nasopharyngeal Swab     Status: None   Collection Time: 09/10/20  1:51 PM   Specimen: Nasopharyngeal Swab; Nasopharyngeal(NP) swabs in vial transport medium  Result Value Ref Range   SARS Coronavirus 2 by RT PCR NEGATIVE NEGATIVE    Comment: (NOTE) SARS-CoV-2 target nucleic acids are NOT DETECTED.  The SARS-CoV-2 RNA is generally detectable in upper respiratory specimens during the acute phase of infection. The lowest concentration of SARS-CoV-2 viral copies this assay can detect is 138 copies/mL. A negative result does not preclude SARS-Cov-2 infection and should not be used as the sole basis for treatment or other patient management decisions. A negative result may occur with  improper specimen collection/handling, submission of specimen other than nasopharyngeal swab, presence of viral mutation(s) within the areas targeted by this assay, and inadequate number of viral copies(<138 copies/mL). A negative result must be combined with clinical observations, patient history, and epidemiological information. The expected result is Negative.  Fact Sheet for Patients:  BloggerCourse.com  Fact Sheet for Healthcare Providers:  SeriousBroker.it  This test is no t yet approved or cleared by the [place] FDA and   has been authorized for detection and/or diagnosis of SARS-CoV-2 by FDA under an Emergency Use Authorization (EUA). This EUA will remain  in effect (meaning this test can be used) for the duration of the COVID-19 declaration under Section 564(b)(1) of the Act, 21 U.S.C.section 360bbb-3(b)(1), unless the authorization is terminated  or revoked sooner.  Influenza A by PCR NEGATIVE NEGATIVE   Influenza B by PCR NEGATIVE NEGATIVE    Comment: (NOTE) The Xpert Xpress SARS-CoV-2/FLU/RSV plus assay is intended as an aid in the diagnosis of influenza from Nasopharyngeal swab specimens and should not be used as a sole basis for treatment. Nasal washings and aspirates are unacceptable for Xpert Xpress SARS-CoV-2/FLU/RSV testing.  Fact Sheet for Patients: BloggerCourse.com  Fact Sheet for Healthcare Providers: SeriousBroker.it  This test is not yet approved or cleared by the [place] FDA and has been authorized for detection and/or diagnosis of SARS-CoV-2 by FDA under an Emergency Use Authorization (EUA). This EUA will remain in effect (meaning this test can be used) for the duration of the COVID-19 declaration under Section 564(b)(1) of the Act, 21 U.S.C. section 360bbb-3(b)(1), unless the authorization is terminated or revoked.  Performed at [facility], [address] [place], [place] [address]   Glucose, capillary     Status: Abnormal   Collection Time: 09/10/20  5:04 PM  Result Value Ref Range   Glucose-Capillary 131 (H) 70 - 99 mg/dL    Comment: Glucose reference range applies only to samples taken after fasting for at least 8 hours.   Comment 1 Notify RN     CT HEMATURIA WORKUP  Result Date: 09/10/2020 CLINICAL DATA:  Hematuria and dysuria. EXAM: CT ABDOMEN AND PELVIS WITHOUT AND WITH CONTRAST TECHNIQUE: Multidetector CT imaging of the abdomen and pelvis was performed following the standard protocol  before and following the bolus administration of intravenous contrast. CONTRAST:  OMNIPAQUE IOHEXOL 300 MG/ML  SOLN COMPARISON:  None. FINDINGS: Lower chest: Emphysema. Ground-glass nodule in the posterior right middle lobe of 2.4 cm on 04/05. Mild cardiomegaly. Pacer. Coronary artery atherosclerosis. Tiny hiatal hernia. Trace left pleural fluid. Hepatobiliary: Focal steatosis adjacent the falciform ligament. Areas of hypoattenuation along the capsule of the right lobe of the liver measure less than 1 cm and may also represent focal steatosis, including on 31/7. Cholecystectomy, without biliary ductal dilatation. Pancreas: Normal, without mass or ductal dilatation. Spleen: Normal in size, without focal abnormality. Adrenals/Urinary Tract: Normal adrenal glands. Left renal vascular calcifications. No renal calculi or hydronephrosis. No hydroureter or ureteric calculi. No bladder calculi. Interpolar left renal 2.5 cm cyst. Other bilateral too small to characterize renal lesions. Moderate renal collecting system opacification on delayed images. Portions of both ureters are suboptimally opacified on delayed images. No collecting system or ureteric filling defect. Foley catheter within the urinary bladder. Hyperattenuating material within consistent with hemorrhage on precontrast image 68/2. The bladder wall is mildly thickened and hyperenhancing after contrast. No well-defined bladder mass. No other filling defect on delayed images, somewhat limited by lack of bladder distension. Stomach/Bowel: Normal remainder of the stomach. Scattered colonic diverticula. Nonobstructive distal and terminal ileum position within the right inguinal canal on 72/7. periampullary duodenal diverticulum. Otherwise normal small bowel. Vascular/Lymphatic: Advanced aortic and branch vessel atherosclerosis. No abdominopelvic adenopathy. Reproductive: Moderate prostatomegaly. Other: No significant free fluid. Fat containing left inguinal  hernia. Subcutaneous edema about the left proximal thigh, including on 103/7. Musculoskeletal: Advanced lumbosacral spondylosis with mild convex right lumbar spine curvature. IMPRESSION: 1. Hemorrhage within the bladder. Bladder wall thickening and mucosal hyperenhancement, suspicious for cystitis. A component of the wall thickening could be related to outlet obstruction in this patient with prostatomegaly. 2. No upper tract explanation for hematuria. 3. Posterior right middle lobe ground-glass nodule of 2.4 cm. Per consensus criteria, this warrants follow-up with chest CT at 6 months (if this  86 year old patient is a therapy candidate). This recommendation follows the consensus statement: Guidelines for Management of Incidental Pulmonary Nodules Detected on CT Images:From the Fleischner Society 2017; published online before print (10.1148/radiol.[id number]). 4. Aortic atherosclerosis (ICD10-I70.0), coronary artery atherosclerosis and emphysema (ICD10-J43.9). 5.  Tiny hiatal hernia. 6. Bilateral inguinal hernias. The right-sided inguinal hernia contains nonobstructive small bowel. 7. Nonspecific subcutaneous edema about the left proximal thigh. Electronically Signed   By: Jeronimo Greaves M.D.   On: 09/10/2020 14:19    Review of Systems  Constitutional: Negative for chills and fever.  HENT: Negative for sinus pain and sore throat.   Respiratory: Negative for cough and shortness of breath.   Cardiovascular: Negative for chest pain and palpitations.  Gastrointestinal: Negative for nausea and vomiting.  Endocrine: Negative for polydipsia and polyuria.  Genitourinary:       Patient does relate recent bleeding through his Foley catheter.  Followed by urology.  Musculoskeletal:       Patient relates some recent pain with his left fifth toe.  Skin:       Patient has had a chronic sore on his left fifth toe managed by the wound care center as well as podiatry and vascular surgery.  Relates some drainage from the  fifth toe as well as some recent bleeding from his left fourth toe.  States he may have injured this  Neurological:       Patient relates significant neuropathy from his knees down.  Psychiatric/Behavioral: Negative for confusion. The patient is not nervous/anxious.    Blood pressure (!) 141/54, pulse (!) 58, temperature 98.2 F (36.8 C), temperature source Oral, resp. rate 17, height 5\' 11"  (1.803 m), weight 74.8 kg, SpO2 100 %. Physical Exam Cardiovascular:     Comments: DP and PT pulses are very weakly palpable bilateral Musculoskeletal:     Comments: Adequate range of motion of the pedal joints.  There is some pain on motion of the fifth toe on physical examination.  Muscle testing deferred.  Skin:    Comments: The skin is warm dry and atrophic bilateral.  Some bilateral edema.  Full-thickness ulceration with a small amount of drainage from the lateral aspect of the left fifth toe which does probe to bone.  The left fourth toenail is loose from the nail plate with some dried and fresh bleeding and mild erythema.  No purulence.  A superficial ulceration is noted over the medial malleolus area of the left ankle.  Neurological:     Comments: Loss of protective threshold with a monofilament wire bilateral.     X-rays: 3 views of the left foot taken a couple of days ago were reviewed today by myself.  There is obvious destruction at the lateral head of the proximal phalanx on the fifth toe consistent with active osteomyelitis.  Extensive arterial calcifications noted throughout the lower leg and foot down into the digital level.    Assessment/Plan: Assessment: 1.  Osteomyelitis left fifth toe. 2.  Paronychia with onycholysis left fourth toe. 3.  Peripheral vascular disease. 4.  Diabetes with associated neuropathy.  Plan: Discussed with the patient the need for amputation of his fifth toe since there is obvious bone infection on the x-rays.  Discussed possible risks and complications of  the procedure including but not limited to failure to heal due to his diabetes, peripheral vascular disease, or continued infection.  Patient states that he has not seen his vascular surgeon since his recent revascularization procedure and that he would like to talk  to him about the need for amputation of the toe and does not want anything done at this time.  Also states that he has an appointment with his podiatrist on Tuesday.  We did discuss the risks of not amputating the toe which could lead to progressive infection requiring more extensive amputation.  Patient still does not want to have any amputation done at this time and wants to follow-up with his previous doctors.  If the patient changes his mind after talking with his family he was instructed that he could reconsult us for evaluation and amputation.  We will place orders for daily dressing changes for the fourth and fifth toes.  The left fourth toenail was completely avulsed today without the need for anesthesia due to his significant neuropathy.  At this point podiatry will sign off.  Ricci Barkerodd W Aariel Ems 09/10/2020, 5:33 PM

## 2020-09-10 NOTE — ED Triage Notes (Signed)
Pt presents to the ED. Pt was seen in the ED on 12 and had a foley catheter placed here. Per [facility] Staff, pt has been having bloody urine since then. Pt is also peeing and leaking around the cath. Bladder scanned pt on arrival and Bladder Scan states . Pt states that it burns and is painful when he urinates. Pt is A&Ox4 and NAD.

## 2020-09-10 NOTE — ED Notes (Signed)
Called equipment at this time to deliver urology cart.

## 2020-09-10 NOTE — ED Provider Notes (Signed)
[facility] Emergency Department Provider Note    Event Date/Time   First MD Initiated Contact with Patient 09/10/20 0830     (approximate)  I have reviewed the triage vital signs and the nursing notes.   HISTORY  Chief Complaint Hematuria    HPI [name] [name] is a 86 y.o. male extensive past medical history of PAD on dual antiplatelet therapy as well as chronic indwelling Foley chronic hematuria [facility] due to worsening gross hematuria and discomfort.  Also has chronic wound to left foot.  No measured fevers.  Was supposed to have gone to urology outpatient clinic for trial of voiding removal but has not been able to get there reportedly.    Past Medical History:  Diagnosis Date  . Diabetes mellitus without complication (HCC)   . Hypertension    History reviewed. No pertinent family history. History reviewed. No pertinent surgical history. Patient Active Problem List   Diagnosis Date Noted  . Gross hematuria 09/10/2020      Prior to Admission medications   Medication Sig Start Date End Date Taking? Authorizing Provider  acetaminophen (TYLENOL) 500 MG tablet Take 500 mg by mouth every 8 (eight) hours as needed.   Yes [provider]  ASPIRIN LOW DOSE 81 MG EC tablet Take 81 mg by mouth daily. 08/03/20  Yes [provider]  cefdinir (OMNICEF) 300 MG capsule Take 1 capsule (300 mg total) by mouth 2 (two) times daily. 09/08/20  Yes Sharman CheekStafford, Phillip, MD  Cholecalciferol 50 MCG (2000 UT) TABS Take 1,000 mg by mouth daily. 08/24/20  Yes [provider]  clopidogrel (PLAVIX) 75 MG tablet Take 75 mg by mouth daily. 08/09/20  Yes [provider]  cyanocobalamin 1000 MCG tablet Take 1,000 mcg by mouth daily.   Yes [provider]  diltiazem (TIAZAC) 180 MG 24 hr capsule Take 180 mg by mouth daily. 09/07/20  Yes [provider]  ELIQUIS 5 MG TABS tablet Take 5 mg by mouth 2 (two) times  daily. 08/24/20  Yes [provider]  ezetimibe (ZETIA) 10 MG tablet Take 10 mg by mouth daily. 08/09/20  Yes [provider]  FEROSUL 325 (65 Fe) MG tablet Take 1 tablet by mouth every other day. 08/07/20  Yes [provider]  finasteride (PROSCAR) 5 MG tablet Take 5 mg by mouth daily. 08/24/20  Yes [provider]  JANUVIA 50 MG tablet Take 50 mg by mouth daily. 09/06/20  Yes [provider]  levothyroxine (SYNTHROID) 125 MCG tablet Take 125 mcg by mouth daily before breakfast.   Yes [provider]  mineral oil-hydrophilic petrolatum (AQUAPHOR) ointment Apply 1 application topically daily. To feet after cleaning   Yes [provider]  Multiple Vitamin (MULTIVITAMIN WITH MINERALS) TABS tablet Take 1 tablet by mouth daily.   Yes [provider]  tamsulosin (FLOMAX) 0.4 MG CAPS capsule Take 0.4 mg by mouth at bedtime. 08/09/20  Yes [provider]    Allergies Statins    Social History Social History   Tobacco Use  . Smoking status: Former Games developermoker  . Smokeless tobacco: Never Used  Vaping Use  . Vaping Use: Never used    Review of Systems Patient denies headaches, rhinorrhea, blurry vision, numbness, shortness of breath, chest pain, edema, cough, abdominal pain, nausea, vomiting, diarrhea, dysuria, fevers, rashes or hallucinations unless otherwise stated above in HPI. ____________________________________________   PHYSICAL EXAM:  VITAL SIGNS: Vitals:   09/10/20 1130 09/10/20 1300  BP: (!) 146/57 (!) 135/56  Pulse: (!) 59 88  Resp: 18 20  Temp:    SpO2: 99% 99%    Constitutional: Alert and oriented.  Eyes: Conjunctivae are normal.  Head: Atraumatic. Nose: No congestion/rhinnorhea. Mouth/Throat: Mucous membranes are moist.   Neck: No stridor. Painless ROM.  Cardiovascular: Normal rate, regular rhythm. Grossly normal heart sounds.  Good peripheral circulation. Respiratory: Normal respiratory  effort.  No retractions. Lungs CTAB. Gastrointestinal: Soft and nontender. No distention. No abdominal bruits. No CVA tenderness. Genitourinary: 13 French indwelling Foley in place Musculoskeletal: Left foot with chronic ulceration wound to the left foot.  No foul smell or purulent discharge.  Is hemostatic at this time.    No joint effusions. Neurologic:  Normal speech and language. No gross focal neurologic deficits are appreciated. No facial droop Skin:  Skin is warm, dry and intact. No rash noted. Psychiatric: Mood and affect are normal. Speech and behavior are normal.  ____________________________________________   LABS (all labs ordered are listed, but only abnormal results are displayed)  Results for orders placed or performed during the hospital encounter of 09/10/20 (from the past 24 hour(s))  CBC with Differential     Status: Abnormal   Collection Time: 09/10/20  8:47 AM  Result Value Ref Range   WBC 9.5 4.0 - 10.5 K/uL   RBC 2.94 (L) 4.22 - 5.81 MIL/uL   Hemoglobin 8.3 (L) 13.0 - 17.0 g/dL   HCT 22.2 (L) 97.9 - 89.2 %   MCV 88.4 80.0 - 100.0 fL   MCH 28.2 26.0 - 34.0 pg   MCHC 31.9 30.0 - 36.0 g/dL   RDW 11.9 (H) 41.7 - 40.8 %   Platelets 230 150 - 400 K/uL   nRBC 0.0 0.0 - 0.2 %   Neutrophils Relative % 83 %   Neutro Abs 8.0 (H) 1.7 - 7.7 K/uL   Lymphocytes Relative 6 %   Lymphs Abs 0.6 (L) 0.7 - 4.0 K/uL   Monocytes Relative 8 %   Monocytes Absolute 0.8 0.1 - 1.0 K/uL   Eosinophils Relative 2 %   Eosinophils Absolute 0.1 0.0 - 0.5 K/uL   Basophils Relative 1 %   Basophils Absolute 0.1 0.0 - 0.1 K/uL   Immature Granulocytes 0 %   Abs Immature Granulocytes 0.04 0.00 - 0.07 K/uL  Comprehensive metabolic panel     Status: Abnormal   Collection Time: 09/10/20  8:47 AM  Result Value Ref Range   Sodium 135 135 - 145 mmol/L   Potassium 4.3 3.5 - 5.1 mmol/L   Chloride 102 98 - 111 mmol/L   CO2 25 22 - 32 mmol/L   Glucose, Bld 171 (H) 70 - 99 mg/dL   BUN 33 (H) 8 -  23 mg/dL   Creatinine, Ser 1.44 0.61 - 1.24 mg/dL   Calcium 8.7 (L) 8.9 - 10.3 mg/dL   Total Protein 6.4 (L) 6.5 - 8.1 g/dL   Albumin 3.1 (L) 3.5 - 5.0 g/dL   AST 18 15 - 41 U/L   ALT 13 0 - 44 U/L   Alkaline Phosphatase 66 38 - 126 U/L   Total Bilirubin 0.5 0.3 - 1.2 mg/dL   GFR, Estimated >81 >85 mL/min   Anion gap 8 5 - 15  Urinalysis, Complete w Microscopic Urine, Catheterized     Status: Abnormal   Collection Time: 09/10/20  9:40 AM  Result Value Ref Range   Color, Urine YELLOW (A) YELLOW   APPearance HAZY (A) CLEAR  Specific Gravity, Urine 1.016 1.005 - 1.030   pH 5.0 5.0 - 8.0   Glucose, UA 50 (A) NEGATIVE mg/dL   Hgb urine dipstick LARGE (A) NEGATIVE   Bilirubin Urine NEGATIVE NEGATIVE   Ketones, ur NEGATIVE NEGATIVE mg/dL   Protein, ur 956 (A) NEGATIVE mg/dL   Nitrite NEGATIVE NEGATIVE   Leukocytes,Ua NEGATIVE NEGATIVE   RBC / HPF >50 (H) 0 - 5 RBC/hpf   WBC, UA 11-20 0 - 5 WBC/hpf   Bacteria, UA RARE (A) NONE SEEN   Squamous Epithelial / LPF NONE SEEN 0 - 5   Budding Yeast PRESENT    Amorphous Crystal PRESENT   Resp Panel by RT-PCR (Flu A&B, Covid) Nasopharyngeal Swab     Status: None   Collection Time: 09/10/20  1:51 PM   Specimen: Nasopharyngeal Swab; Nasopharyngeal(NP) swabs in vial transport medium  Result Value Ref Range   SARS Coronavirus 2 by RT PCR NEGATIVE NEGATIVE   Influenza A by PCR NEGATIVE NEGATIVE   Influenza B by PCR NEGATIVE NEGATIVE   ____________________________________________ ____________________________________________  RADIOLOGY  EMERGENCY DEPARTMENT ULTRASOUND  Study: Limited Ultrasound of Bladder  INDICATIONS: to assess for urinary retention and/or bladder volume prior to urinary catheter Multiple views of the bladder were obtained in real-time in the transverse and longitudinal planes with a multi-frequency probe.  PERFORMED BY: Myself IMAGES ARCHIVED?: No LIMITATIONS:  none INTERPRETATION: Large Volume  Clot appear surrounding  the catheter bulb       ____________________________________________   PROCEDURES  Procedure(s) performed:  Procedures    Critical Care performed: no ____________________________________________   INITIAL IMPRESSION / ASSESSMENT AND PLAN / ED COURSE  Pertinent labs & imaging results that were available during my care of the patient were reviewed by me and considered in my medical decision making (see chart for details).   DDX: Catheter obstruction or displacement, UTI, anuria, anemia, chronic wound, osteo-  [name] [name] is a 86 y.o. who presents to the ED with symptoms as described above.  Wound appears stable.  No evidence of active bleeding.  Will need referral to wound care clinic.  Primary reason for visit today is due to clot and Foley catheter.  Given reported hematuria will check blood work make sure there is no sign of AKI or significant drop in his hemoglobin.  Will exchange Foley catheter but based on his ultrasound showing large clot as he is on dual antiplatelet therapy will place three-way for bladder irrigation.  Clinical Course as of 09/10/20 1548  Sat Sep 10, 2020  1301 Patient initially clear to pink-tinged urine on CBI but now having additional bleeding and small clots. Given his bladder distention and persistent hematuria we will continue CBI. Will consult urology. Will discuss with hospitalist for admission [PR]    Clinical Course User Index [PR] Willy Eddy, MD    The patient was evaluated in Emergency Department today for the symptoms described in the history of present illness. He/she was evaluated in the context of the global COVID-19 pandemic, which necessitated consideration that the patient might be at risk for infection with the SARS-CoV-2 virus that causes COVID-19. Institutional protocols and algorithms that pertain to the evaluation of patients at risk for COVID-19 are in a state of rapid change based on information released by regulatory  bodies including the CDC and federal and state organizations. These policies and algorithms were followed during the patient's care in the ED.  As part of my medical decision making, I reviewed the following data within the  electronic MEDICAL RECORD NUMBER Nursing notes reviewed and incorporated, Labs reviewed, notes from prior ED visits and [place] Controlled Substance Database   ____________________________________________   FINAL CLINICAL IMPRESSION(S) / ED DIAGNOSES  Final diagnoses:  Hematuria      NEW MEDICATIONS STARTED DURING THIS VISIT:  New Prescriptions   No medications on file     Note:  This document was prepared using Dragon voice recognition software and may include unintentional dictation errors.    Willy Eddy, MD 09/10/20 [phone]

## 2020-09-10 NOTE — ED Notes (Signed)
After removing previous catheter. Blood clot noted at the end of the catheter tip. After inserting new 3 way catheter. Amber colored urine with small blood clots noted draining from the catheter.

## 2020-09-10 NOTE — Consult Note (Signed)
Subjective: 1. Hematuria     [name] [name] is a 86 yo male who I was asked to see in consultation by Dr. Roxan Hockey for gross hematuria with clot retention.  He originally was seen in the ER on 12/09 at [facility] with gross hematuria and was irrigated out then.  He had been on warfarin for an endovascular procedure but that was stopped and changed to Eliquis, plavix and ASA.    He was placed on tamsulosin and finasteride and was scheduled to f/u with urology at [facility] on 12/23, but he was unable to make the appointment since he was in the ER here for some issues with the left toe and complained of foley irritation.  His UA looked infected and he was cultured and started on Cefdinir.  The culture is growing GNR's.  He returned today with pain and retention and a 22 fr 3-way foley was placed with return of bloody urine and clots.  His Hgb had fallen from 9.3 to 8.3 and a CT today demonstrated some residual clot in the bladder.    He has a history of prostate cancer treated with brachytherapy in the 1980's.       ROS:  Review of Systems  Constitutional: Negative for chills and fever.  Respiratory: Negative for shortness of breath.   Cardiovascular: Negative for chest pain.  Gastrointestinal: Negative for abdominal pain.  Genitourinary: Positive for hematuria.  Musculoskeletal:       Recent bleeding from a left toe.   All other systems reviewed and are negative.   Allergies  Allergen Reactions  . Statins Palpitations    Past Medical History:  Diagnosis Date  . Diabetes mellitus without complication (HCC)   . Hypertension     History reviewed. No pertinent surgical history.  Social History   Socioeconomic History  . Marital status: Widowed    Spouse name: Not on file  . Number of children: Not on file  . Years of education: Not on file  . Highest education level: Not on file  Occupational History  . Not on file  Tobacco Use  . Smoking status: Former Games developer  . Smokeless tobacco: Never Used   Vaping Use  . Vaping Use: Never used  Substance and Sexual Activity  . Alcohol use: Not on file  . Drug use: Not on file  . Sexual activity: Not on file  Other Topics Concern  . Not on file  Social History Narrative  . Not on file   Social Determinants of Health   Financial Resource Strain: Not on file  Food Insecurity: Not on file  Transportation Needs: Not on file  Physical Activity: Not on file  Stress: Not on file  Social Connections: Not on file  Intimate Partner Violence: Not on file    History reviewed. No pertinent family history.  Anti-infectives: Anti-infectives (From admission, onward)   None      Current Facility-Administered Medications  Medication Dose Route Frequency Provider Last Rate Last Admin  . sodium chloride irrigation 0.9 % 3,000 mL  3,000 mL Irrigation Continuous Willy Eddy, MD   Stopped at 09/10/20 1111  . sodium chloride irrigation 0.9 % 3,000 mL  3,000 mL Irrigation Continuous Willy Eddy, MD   3,000 mL at 09/10/20 1414   Current Outpatient Medications  Medication Sig Dispense Refill  . acetaminophen (TYLENOL) 500 MG tablet Take 500 mg by mouth every 8 (eight) hours as needed.    . ASPIRIN LOW DOSE 81 MG EC tablet Take 81 mg  by mouth daily.    . cefdinir (OMNICEF) 300 MG capsule Take 1 capsule (300 mg total) by mouth 2 (two) times daily. 14 capsule 0  . Cholecalciferol 50 MCG (2000 UT) TABS Take 1,000 mg by mouth daily.    . clopidogrel (PLAVIX) 75 MG tablet Take 75 mg by mouth daily.    . cyanocobalamin 1000 MCG tablet Take 1,000 mcg by mouth daily.    Marland Kitchen diltiazem (TIAZAC) 180 MG 24 hr capsule Take 180 mg by mouth daily.    Marland Kitchen ELIQUIS 5 MG TABS tablet Take 5 mg by mouth 2 (two) times daily.    Marland Kitchen ezetimibe (ZETIA) 10 MG tablet Take 10 mg by mouth daily.    . FEROSUL 325 (65 Fe) MG tablet Take 1 tablet by mouth every other day.    . finasteride (PROSCAR) 5 MG tablet Take 5 mg by mouth daily.    Marland Kitchen JANUVIA 50 MG tablet Take 50 mg  by mouth daily.    Marland Kitchen levothyroxine (SYNTHROID) 125 MCG tablet Take 125 mcg by mouth daily before breakfast.    . mineral oil-hydrophilic petrolatum (AQUAPHOR) ointment Apply 1 application topically daily. To feet after cleaning    . Multiple Vitamin (MULTIVITAMIN WITH MINERALS) TABS tablet Take 1 tablet by mouth daily.    . tamsulosin (FLOMAX) 0.4 MG CAPS capsule Take 0.4 mg by mouth at bedtime.       Objective: Vital signs in last 24 hours: BP (!) 135/56 (BP Location: Right Arm)   Pulse 88   Temp (!) 97.3 F (36.3 C) (Oral)   Resp 20   Ht 5\' 11"  (1.803 m)   Wt 74.8 kg   SpO2 99%   BMI 23.01 kg/m   Intake/Output from previous day: No intake/output data recorded. Intake/Output this shift: Total I/O In: -  Out: 3400 [Urine:3400]   Physical Exam Vitals reviewed.  Constitutional:      Appearance: Normal appearance.  Cardiovascular:     Rate and Rhythm: Normal rate and regular rhythm.  Pulmonary:     Effort: Pulmonary effort is normal. No respiratory distress.  Abdominal:     General: Abdomen is flat.     Palpations: Abdomen is soft.     Tenderness: There is no abdominal tenderness.  Genitourinary:    Comments: Normal phallus with a foley at the meatus draining pink tinged urine. Scrotum, testes and epididymis normal.  Musculoskeletal:        General: Normal range of motion.     Comments: The left second toe is coated with old blood.    Skin:    General: Skin is warm and dry.  Neurological:     General: No focal deficit present.     Mental Status: He is alert and oriented to person, place, and time.     Lab Results:  Results for orders placed or performed during the hospital encounter of 09/10/20 (from the past 24 hour(s))  CBC with Differential     Status: Abnormal   Collection Time: 09/10/20  8:47 AM  Result Value Ref Range   WBC 9.5 4.0 - 10.5 K/uL   RBC 2.94 (L) 4.22 - 5.81 MIL/uL   Hemoglobin 8.3 (L) 13.0 - 17.0 g/dL   HCT 09/12/20 (L) 41.9 - 62.2 %   MCV  88.4 80.0 - 100.0 fL   MCH 28.2 26.0 - 34.0 pg   MCHC 31.9 30.0 - 36.0 g/dL   RDW 29.7 (H) 98.9 - 21.1 %   Platelets 230 150 -  400 K/uL   nRBC 0.0 0.0 - 0.2 %   Neutrophils Relative % 83 %   Neutro Abs 8.0 (H) 1.7 - 7.7 K/uL   Lymphocytes Relative 6 %   Lymphs Abs 0.6 (L) 0.7 - 4.0 K/uL   Monocytes Relative 8 %   Monocytes Absolute 0.8 0.1 - 1.0 K/uL   Eosinophils Relative 2 %   Eosinophils Absolute 0.1 0.0 - 0.5 K/uL   Basophils Relative 1 %   Basophils Absolute 0.1 0.0 - 0.1 K/uL   Immature Granulocytes 0 %   Abs Immature Granulocytes 0.04 0.00 - 0.07 K/uL  Comprehensive metabolic panel     Status: Abnormal   Collection Time: 09/10/20  8:47 AM  Result Value Ref Range   Sodium 135 135 - 145 mmol/L   Potassium 4.3 3.5 - 5.1 mmol/L   Chloride 102 98 - 111 mmol/L   CO2 25 22 - 32 mmol/L   Glucose, Bld 171 (H) 70 - 99 mg/dL   BUN 33 (H) 8 - 23 mg/dL   Creatinine, Ser 1.93 0.61 - 1.24 mg/dL   Calcium 8.7 (L) 8.9 - 10.3 mg/dL   Total Protein 6.4 (L) 6.5 - 8.1 g/dL   Albumin 3.1 (L) 3.5 - 5.0 g/dL   AST 18 15 - 41 U/L   ALT 13 0 - 44 U/L   Alkaline Phosphatase 66 38 - 126 U/L   Total Bilirubin 0.5 0.3 - 1.2 mg/dL   GFR, Estimated >79 >02 mL/min   Anion gap 8 5 - 15  Urinalysis, Complete w Microscopic Urine, Catheterized     Status: Abnormal   Collection Time: 09/10/20  9:40 AM  Result Value Ref Range   Color, Urine YELLOW (A) YELLOW   APPearance HAZY (A) CLEAR   Specific Gravity, Urine 1.016 1.005 - 1.030   pH 5.0 5.0 - 8.0   Glucose, UA 50 (A) NEGATIVE mg/dL   Hgb urine dipstick LARGE (A) NEGATIVE   Bilirubin Urine NEGATIVE NEGATIVE   Ketones, ur NEGATIVE NEGATIVE mg/dL   Protein, ur 409 (A) NEGATIVE mg/dL   Nitrite NEGATIVE NEGATIVE   Leukocytes,Ua NEGATIVE NEGATIVE   RBC / HPF >50 (H) 0 - 5 RBC/hpf   WBC, UA 11-20 0 - 5 WBC/hpf   Bacteria, UA RARE (A) NONE SEEN   Squamous Epithelial / LPF NONE SEEN 0 - 5   Budding Yeast PRESENT    Amorphous Crystal PRESENT   Resp  Panel by RT-PCR (Flu A&B, Covid) Nasopharyngeal Swab     Status: None   Collection Time: 09/10/20  1:51 PM   Specimen: Nasopharyngeal Swab; Nasopharyngeal(NP) swabs in vial transport medium  Result Value Ref Range   SARS Coronavirus 2 by RT PCR NEGATIVE NEGATIVE   Influenza A by PCR NEGATIVE NEGATIVE   Influenza B by PCR NEGATIVE NEGATIVE    BMET Recent Labs    09/08/20 1103 09/10/20 0847  NA 138 135  K 4.5 4.3  CL 103 102  CO2 26 25  GLUCOSE 180* 171*  BUN 21 33*  CREATININE 0.98 1.07  CALCIUM 9.2 8.7*   PT/INR Recent Labs    09/08/20 1103  LABPROT 15.1  INR 1.2   ABG No results for input(s): PHART, HCO3 in the last 72 hours.  Invalid input(s): PCO2, PO2  Studies/Results: CT HEMATURIA WORKUP  Result Date: 09/10/2020 CLINICAL DATA:  Hematuria and dysuria. EXAM: CT ABDOMEN AND PELVIS WITHOUT AND WITH CONTRAST TECHNIQUE: Multidetector CT imaging of the abdomen and pelvis was performed following  the standard protocol before and following the bolus administration of intravenous contrast. CONTRAST:  OMNIPAQUE IOHEXOL 300 MG/ML  SOLN COMPARISON:  None. FINDINGS: Lower chest: Emphysema. Ground-glass nodule in the posterior right middle lobe of 2.4 cm on 04/05. Mild cardiomegaly. Pacer. Coronary artery atherosclerosis. Tiny hiatal hernia. Trace left pleural fluid. Hepatobiliary: Focal steatosis adjacent the falciform ligament. Areas of hypoattenuation along the capsule of the right lobe of the liver measure less than 1 cm and may also represent focal steatosis, including on 31/7. Cholecystectomy, without biliary ductal dilatation. Pancreas: Normal, without mass or ductal dilatation. Spleen: Normal in size, without focal abnormality. Adrenals/Urinary Tract: Normal adrenal glands. Left renal vascular calcifications. No renal calculi or hydronephrosis. No hydroureter or ureteric calculi. No bladder calculi. Interpolar left renal 2.5 cm cyst. Other bilateral too small to characterize  renal lesions. Moderate renal collecting system opacification on delayed images. Portions of both ureters are suboptimally opacified on delayed images. No collecting system or ureteric filling defect. Foley catheter within the urinary bladder. Hyperattenuating material within consistent with hemorrhage on precontrast image 68/2. The bladder wall is mildly thickened and hyperenhancing after contrast. No well-defined bladder mass. No other filling defect on delayed images, somewhat limited by lack of bladder distension. Stomach/Bowel: Normal remainder of the stomach. Scattered colonic diverticula. Nonobstructive distal and terminal ileum position within the right inguinal canal on 72/7. periampullary duodenal diverticulum. Otherwise normal small bowel. Vascular/Lymphatic: Advanced aortic and branch vessel atherosclerosis. No abdominopelvic adenopathy. Reproductive: Moderate prostatomegaly. Other: No significant free fluid. Fat containing left inguinal hernia. Subcutaneous edema about the left proximal thigh, including on 103/7. Musculoskeletal: Advanced lumbosacral spondylosis with mild convex right lumbar spine curvature. IMPRESSION: 1. Hemorrhage within the bladder. Bladder wall thickening and mucosal hyperenhancement, suspicious for cystitis. A component of the wall thickening could be related to outlet obstruction in this patient with prostatomegaly. 2. No upper tract explanation for hematuria. 3. Posterior right middle lobe ground-glass nodule of 2.4 cm. Per consensus criteria, this warrants follow-up with chest CT at 6 months (if this 86 year old patient is a therapy candidate). This recommendation follows the consensus statement: Guidelines for Management of Incidental Pulmonary Nodules Detected on CT Images:From the Fleischner Society 2017; published online before print (10.1148/radiol.0240973532). 4. Aortic atherosclerosis (ICD10-I70.0), coronary artery atherosclerosis and emphysema (ICD10-J43.9). 5.  Tiny  hiatal hernia. 6. Bilateral inguinal hernias. The right-sided inguinal hernia contains nonobstructive small bowel. 7. Nonspecific subcutaneous edema about the left proximal thigh. Electronically Signed   By: Jeronimo Greaves M.D.   On: 09/10/2020 14:19   Procedure:  The foley was hand irrigated with about of saline with return of several old clots but the active bleeding appears to have resolved.     Assessment/Plan: Gross hematuria with acute blood loss anemia.   He is  on Eliquis, Plavix and ASA for a recent endovascular procedure.    I was able to evacuate the clots and there doesn't appear to be active bleeding.  He can probably have a voiding trial in the morning if the urine remains clear.   With his recent vascular procedure, it would be best to leave him on the antiplatelet agents since the bleeding appears to have stopped.  History of prostate cancer with brachytherapy in the 80's and the last PSA I find is from 2013 and was 0.13.   It would be worthwhile to repeat a PSA, so I have placed the orders.   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    He was previous seen by [facility] in [place] be seen closer to home.  I will notify [facility] to arrange f/u in the [facility] office.             CC: Dr. Willy EddyPatrick Robinson.      [name] PippinJohn Tamea [name] 09/10/2020 [phone]

## 2020-09-11 DIAGNOSIS — R310 Gross hematuria: Secondary | ICD-10-CM

## 2020-09-11 DIAGNOSIS — D62 Acute posthemorrhagic anemia: Secondary | ICD-10-CM

## 2020-09-11 DIAGNOSIS — Z8546 Personal history of malignant neoplasm of prostate: Secondary | ICD-10-CM

## 2020-09-11 LAB — BASIC METABOLIC PANEL
Anion gap: 6 (ref 5–15)
BUN: 23 mg/dL (ref 8–23)
CO2: 27 mmol/L (ref 22–32)
Calcium: 8.4 mg/dL — ABNORMAL LOW (ref 8.9–10.3)
Chloride: 103 mmol/L (ref 98–111)
Creatinine, Ser: 0.91 mg/dL (ref 0.61–1.24)
GFR, Estimated: >60 mL/min (ref >60)
Glucose, Bld: 116 mg/dL — ABNORMAL HIGH (ref 70–99)
Potassium: 4.3 mmol/L (ref 3.5–5.1)
Sodium: 136 mmol/L (ref 135–145)

## 2020-09-11 LAB — URINE CULTURE
Culture: >=100000 — AB
Culture: NO GROWTH

## 2020-09-11 LAB — GLUCOSE, CAPILLARY
Glucose-Capillary: 108 mg/dL — ABNORMAL HIGH (ref 70–99)
Glucose-Capillary: 243 mg/dL — ABNORMAL HIGH (ref 70–99)
Glucose-Capillary: 270 mg/dL — ABNORMAL HIGH (ref 70–99)
Glucose-Capillary: 90 mg/dL (ref 70–99)

## 2020-09-11 LAB — CBC
HCT: 24.5 % — ABNORMAL LOW (ref 39.0–52.0)
Hemoglobin: 7.9 g/dL — ABNORMAL LOW (ref 13.0–17.0)
MCH: 28.4 pg (ref 26.0–34.0)
MCHC: 32.2 g/dL (ref 30.0–36.0)
MCV: 88.1 fL (ref 80.0–100.0)
Platelets: 223 10*3/uL (ref 150–400)
RBC: 2.78 MIL/uL — ABNORMAL LOW (ref 4.22–5.81)
RDW: 15.5 % (ref 11.5–15.5)
WBC: 7.3 10*3/uL (ref 4.0–10.5)
nRBC: 0 % (ref 0.0–0.2)

## 2020-09-11 LAB — HEMOGLOBIN A1C
Hgb A1c MFr Bld: 7.6 % — ABNORMAL HIGH (ref 4.8–5.6)
Mean Plasma Glucose: 171.42 mg/dL

## 2020-09-11 LAB — PSA: Prostatic Specific Antigen: 0.98 ng/mL (ref 0.00–4.00)

## 2020-09-11 LAB — HEMOGLOBIN AND HEMATOCRIT, BLOOD
HCT: 24.8 % — ABNORMAL LOW (ref 39.0–52.0)
Hemoglobin: 7.7 g/dL — ABNORMAL LOW (ref 13.0–17.0)

## 2020-09-11 MED ORDER — MUPIROCIN 2 % EX OINT
TOPICAL_OINTMENT | Freq: Every day | CUTANEOUS | Status: DC
  Filled 2020-09-11: qty 22

## 2020-09-11 MED ORDER — SODIUM CHLORIDE 0.9 % IV BOLUS
500.0000 mL | Freq: Once | INTRAVENOUS | Status: AC
  Administered 2020-09-11: 03:00:00 500 mL via INTRAVENOUS

## 2020-09-11 MED ORDER — MUPIROCIN 2 % EX OINT: TOPICAL_OINTMENT | Freq: Two times a day (BID) | CUTANEOUS | Status: DC

## 2020-09-11 MED ORDER — CHLORHEXIDINE GLUCONATE CLOTH 2 % EX PADS
6.0000 | MEDICATED_PAD | Freq: Every day | CUTANEOUS | Status: DC
  Administered 2020-09-12 – 2020-09-13 (×2): 6 via TOPICAL

## 2020-09-11 NOTE — Progress Notes (Signed)
Patient watching football. He is a good historian.  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  He said the catheter has been in and out since he had a revascularization procedure on his left leg.  No acute distress Alert and oriented Urine clear on a very slow CBI drip.  1 drip every 5 seconds. Abdomen soft and non tender Bandaged left foot with a weeping toe  Assessment/plan: Gross hematuria-resolved.  Again he confirmed today he would like to follow-up with his urologist at [facility].  It would be reasonable to perform a voiding trial when patient is ready to have the Foley removed.  Remove the Foley as early as possible in the morning.  Will sign off.  Please page GU with any questions, concerns or change in patient status.

## 2020-09-11 NOTE — Progress Notes (Signed)
Patients daughter Victorino Dike would like for provider to contact her today as she is concerned that patient may be discharged and she does not feel he should be discharged at this time. [phone] is her contact number.

## 2020-09-11 NOTE — Progress Notes (Signed)
Subjective/Chief Complaint: Patient seen. Doing well. Discussed with the patient that his daughter did leave a message that she wants to discuss his condition prior to him being discharged. He states that he and his daughter are in agreement that they would like to have this reevaluated by vascular surgery done at [facility] prior to amputation.   Objective: Vital signs in last 24 hours: Temp:  [97.6 F (36.4 C)-99 F (37.2 C)] 99 F (37.2 C) (12/26 0529) Pulse Rate:  [45-112] 68 (12/26 0529) Resp:  [16-20] 20 (12/26 0529) BP: (104-141)/(38-66) 115/50 (12/26 0529) SpO2:  [96 %-100 %] 97 % (12/26 0529) Last BM Date: 09/10/20  Intake/Output from previous day: 12/25 0701 - 12/26 0700 In: 3 [I.V.:3] Out: 8725 [Urine:8725] Intake/Output this shift: Total I/O In: 363 [P.O.:360; I.V.:3] Out: -   Dressing on the left foot is dry and intact.  Lab Results:  Recent Labs    09/10/20 0847 09/11/20 0605  WBC 9.5 7.3  HGB 8.3* 7.9*  HCT 26.0* 24.5*  PLT 230 223   BMET Recent Labs    09/10/20 0847 09/11/20 0605  NA 135 136  K 4.3 4.3  CL 102 103  CO2 25 27  GLUCOSE 171* 116*  BUN 33* 23  CREATININE 1.07 0.91  CALCIUM 8.7* 8.4*   PT/INR No results for input(s): LABPROT, INR in the last 72 hours. ABG No results for input(s): PHART, HCO3 in the last 72 hours.  Invalid input(s): PCO2, PO2  Studies/Results: US Venous Img Lower Unilateral Left (DVT)  Result Date: 09/11/2020 CLINICAL DATA:  Swelling EXAM: LEFT LOWER EXTREMITY VENOUS DOPPLER ULTRASOUND TECHNIQUE: Gray-scale sonography with compression, as well as color and duplex ultrasound, were performed to evaluate the deep venous system(s) from the level of the common femoral vein through the popliteal and proximal calf veins. COMPARISON:  None. FINDINGS: VENOUS Normal compressibility of the common femoral, superficial femoral, and popliteal veins, as well as the visualized calf veins. Visualized portions of profunda femoral  vein and great saphenous vein unremarkable. No filling defects to suggest DVT on grayscale or color Doppler imaging. Doppler waveforms show normal direction of venous flow, normal respiratory phasicity and response to augmentation. Limited views of the contralateral common femoral vein are unremarkable. OTHER None. Limitations: Technologist describes technically difficult study secondary to calf edema. IMPRESSION: No femoropopliteal DVT nor evidence of DVT within the visualized calf veins. If clinical symptoms are inconsistent or if there are persistent or worsening symptoms, further imaging (possibly involving the iliac veins) may be warranted. Electronically Signed   By: Corlis Leak M.D.   On: 09/11/2020 07:46   CT HEMATURIA WORKUP  Result Date: 09/10/2020 CLINICAL DATA:  Hematuria and dysuria. EXAM: CT ABDOMEN AND PELVIS WITHOUT AND WITH CONTRAST TECHNIQUE: Multidetector CT imaging of the abdomen and pelvis was performed following the standard protocol before and following the bolus administration of intravenous contrast. CONTRAST:  OMNIPAQUE IOHEXOL 300 MG/ML  SOLN COMPARISON:  None. FINDINGS: Lower chest: Emphysema. Ground-glass nodule in the posterior right middle lobe of 2.4 cm on 04/05. Mild cardiomegaly. Pacer. Coronary artery atherosclerosis. Tiny hiatal hernia. Trace left pleural fluid. Hepatobiliary: Focal steatosis adjacent the falciform ligament. Areas of hypoattenuation along the capsule of the right lobe of the liver measure less than 1 cm and may also represent focal steatosis, including on 84/7. Cholecystectomy, without biliary ductal dilatation. Pancreas: Normal, without mass or ductal dilatation. Spleen: Normal in size, without focal abnormality. Adrenals/Urinary Tract: Normal adrenal glands. Left renal vascular calcifications. No  renal calculi or hydronephrosis. No hydroureter or ureteric calculi. No bladder calculi. Interpolar left renal 2.5 cm cyst. Other bilateral too small to  characterize renal lesions. Moderate renal collecting system opacification on delayed images. Portions of both ureters are suboptimally opacified on delayed images. No collecting system or ureteric filling defect. Foley catheter within the urinary bladder. Hyperattenuating material within consistent with hemorrhage on precontrast image 68/2. The bladder wall is mildly thickened and hyperenhancing after contrast. No well-defined bladder mass. No other filling defect on delayed images, somewhat limited by lack of bladder distension. Stomach/Bowel: Normal remainder of the stomach. Scattered colonic diverticula. Nonobstructive distal and terminal ileum position within the right inguinal canal on 84/7. periampullary duodenal diverticulum. Otherwise normal small bowel. Vascular/Lymphatic: Advanced aortic and branch vessel atherosclerosis. No abdominopelvic adenopathy. Reproductive: Moderate prostatomegaly. Other: No significant free fluid. Fat containing left inguinal hernia. Subcutaneous edema about the left proximal thigh, including on 84/7. Musculoskeletal: Advanced lumbosacral spondylosis with mild convex right lumbar spine curvature. IMPRESSION: 1. Hemorrhage within the bladder. Bladder wall thickening and mucosal hyperenhancement, suspicious for cystitis. A component of the wall thickening could be related to outlet obstruction in this patient with prostatomegaly. 2. No upper tract explanation for hematuria. 3. Posterior right middle lobe ground-glass nodule of 2.4 cm. Per consensus criteria, this warrants follow-up with chest CT at 6 months (if this 86 year old patient is a therapy candidate). This recommendation follows the consensus statement: Guidelines for Management of Incidental Pulmonary Nodules Detected on CT Images:From the Fleischner Society 2017; published online before print (10.1148/[id number]). 4. Aortic atherosclerosis (ICD10-I70.0), coronary artery atherosclerosis and emphysema  (ICD10-J43.9). 5.  Tiny hiatal hernia. 6. Bilateral inguinal hernias. The right-sided inguinal hernia contains nonobstructive small bowel. 7. Nonspecific subcutaneous edema about the left proximal thigh. Electronically Signed   By: Jeronimo Greaves M.D.   On: 09/10/2020 14:19    Anti-infectives: Anti-infectives (From admission, onward)   None      Assessment/Plan: s/p * No surgery found * Assessment: Osteomyelitis left fifth toe, stable.   Plan: Discussed with the patient that I will try to contact his daughter again. I was able to reach his daughter by phone and explained our conversation from yesterday that he eventually will need to have the toe amputated but at this point it does not appear grossly cellulitic or inflamed and I think he should be stable to have this done in the near future after he does consult with vascular surgery. Also has an appointment on Tuesday with his podiatrist down at [facility]. Discussed with the daughter possible impediments to healing including his diabetes, infection, and his vascular status. Patient should be discharged on some oral antibiotics for coverage until he can be seen by his physicians down at [facility]. Podiatry will sign off for now.  LOS: 1 day    Ricci Barker 09/11/2020

## 2020-09-11 NOTE — Plan of Care (Signed)
  Problem: Education: Goal: Knowledge of General Education information will improve Description: Including pain rating scale, medication(s)/side effects and non-pharmacologic comfort measures Outcome: Progressing   Problem: Health Behavior/Discharge Planning: Goal: Ability to manage health-related needs will improve Outcome: Progressing   Problem: Clinical Measurements: Goal: Ability to maintain clinical measurements within normal limits will improve Outcome: Progressing Goal: Will remain free from infection Outcome: Progressing Goal: Diagnostic test results will improve Outcome: Progressing Goal: Respiratory complications will improve Outcome: Progressing Goal: Cardiovascular complication will be avoided Outcome: Progressing   Problem: Activity: Goal: Risk for activity intolerance will decrease Outcome: Progressing   Problem: Elimination: Goal: Will not experience complications related to bowel motility Outcome: Progressing   Problem: Safety: Goal: Ability to remain free from injury will improve Outcome: Progressing   Problem: Skin Integrity: Goal: Risk for impaired skin integrity will decrease Outcome: Progressing   

## 2020-09-11 NOTE — Progress Notes (Signed)
PROGRESS NOTE    [name] [name]  ZOX:[id number] DOB: 1928/04/21 DOA: 09/10/2020 PCP: Zenaida Niece, MD    Brief Narrative:  This 86 years old male with PMH significant for peripheral arterial disease with chronic wound involving left fourth and fifth toe for which he is on antibiotic therapy, history of urinary retention with indwelling Foley catheter, diabetes mellitus and hypertension who was sent to emergency room from assisted living for the evaluation of hematuria. He was seen 2 days ago at the hospital for malfunctioning of his Foley catheter which was replaced and patient was discharged with outpatient urology follow-up.  CT scanning of abdomen pelvis showed hemorrhage within the bladder bladder wall thickening and mucosal hyperenhancement suspicious for cystitis.  Left foot x-ray shows erosive changes in the distal aspect of fifth proximal phalanx consistent with osteomyelitis.  Patient also found to have a 1 g drop in hemoglobin. Patient is admitted for gross hematuria and chronic left fifth toe osteomyelitis.  Urology was consulted recommended to continue CBI and outpatient follow-up with [facility] urology.  Podiatry was consulted which recommended amputation of left fifth toe given osteomyelitis.  Patient want to get second opinion from his [facility] vascular surgery before considering amputation.   Assessment & Plan:   Principal Problem:   Gross hematuria Active Problems:   Diabetes mellitus without complication (HCC)   Hypertension   PAD (peripheral artery disease) (HCC)   Osteomyelitis, chronic, lower leg, left (HCC)   Hypothyroid   Lung nodule seen on imaging study  Gross hematuria could be secondary to hemorrhagic cystitis: Patient has indwelling Foley catheter for urinary retention for some time. He was seen 2 days ago for malfunctioning urinary catheter it was replaced and patient was discharged. Unknown etiology at this point,  could be traumatic catheterization. CT scan of abdomen  and pelvis done with and without contrast shows possible hemorrhagic cystitis Patient was on Eliquis, aspirin and Plavix and is noted to have a 1 g drop in his H&H over the last 48 hours (9.3g/dl >> 8.1X/BJ) We will hold anticoagulant for the next 24 hours and repeat H&H. Urology consulted recommended to continue aspirin and Plavix given recent endovascular procedure. Consider voiding trial if urine clears in the morning. Follow-up results of urine culture Continue CBI until urine is clear  Diabetes mellitus: Maintain consistent carbohydrate diet Glycemic control with sliding scale insulin  CKD stage IIIa: Renal functions at baseline.   Avoid nephrotoxic medications.  BPH Continue Flomax and finasteride  Hypertension Continue diltiazem  Hypothyroidism Continue Synthroid  Lung nodule Incidental finding Noted in the posterior right middle lobe Repeat imaging has been recommended in the next 6 months if patient is deemed a therapy candidate   Chronic left fifth toe osteomyelitis Patient follows up at [facility] and from chart review: he was initially treated with IV antibiotics for osteomyelitis although then ID was consulted and the recommendation was to stop antibiotics, because the osteomyelitis appeared chronic and definitive treatment is surgical.  X-ray on 12/2 demonstrated osseous resorption in the proximal distal phalanx of the fifth toe suggestive of osteomyelitis. This is the same as the findings on the x-ray from 12/23 Podiatry consulted recommended amputation of left fifth toe.  Patient declined at this point. Patient want to get second opinion from his vascular surgeon before considering amputation. Patient follows with physicians in [facility] system prefers to be seen in the same system.   DVT prophylaxis: SCDs Code Status: DNR Family Communication: No family at bedside Disposition Plan:   Status  is: Inpatient  Remains inpatient appropriate  because:Hemodynamically unstable and Inpatient level of care appropriate due to severity of illness   Dispo: The patient is from: Home              Anticipated d/c is to: Home              Anticipated d/c date is: 2 days              Patient currently is not medically stable to d/c.  Consultants:   Podiatry  Urology  Procedures: Antimicrobials: Anti-infectives (From admission, onward)   None      Subjective: Patient was seen and examined at bedside.  Overnight events noted.  Patient remains on CBI, he has a Foley catheter,  still showing pinkish urine.  He denies any pain or difficulty breathing.  Objective: Vitals:   09/11/20 0020 09/11/20 0055 09/11/20 0401 09/11/20 0529  BP: (!) 109/38 (!) 104/46 (!) 110/48 (!) 115/50  Pulse: (!) 112   68  Resp:    20  Temp:    99 F (37.2 C)  TempSrc:    Oral  SpO2: 96%   97%  Weight:      Height:        Intake/Output Summary (Last 24 hours) at 09/11/2020 1354 Last data filed at 09/11/2020 1029 Gross per 24 hour  Intake 366 ml  Output 5325 ml  Net -4959 ml   Filed Weights   09/10/20 0826  Weight: 74.8 kg    Examination:  General exam: Appears calm and comfortable, not in any distress Respiratory system: Clear to auscultation. Respiratory effort normal. Cardiovascular system: S1 & S2 heard, RRR. No JVD, murmurs, rubs, gallops or clicks. No pedal edema. Gastrointestinal system: Abdomen is nondistended, soft and nontender. No organomegaly or masses felt. Normal bowel sounds heard. Central nervous system: Alert and oriented. No focal neurological deficits. Extremities: Left fifth and fourth toe erythematous with wound. Skin: No rashes, lesions or ulcers Psychiatry: Judgement and insight appear normal. Mood & affect appropriate.     Data Reviewed: I have personally reviewed following labs and imaging studies  CBC: Recent Labs  Lab 09/08/20 1103 09/10/20 0847 09/11/20 0605  WBC 10.9* 9.5 7.3  NEUTROABS 9.2* 8.0*   --   HGB 9.3* 8.3* 7.9*  HCT 30.0* 26.0* 24.5*  MCV 89.6 88.4 88.1  PLT 272 230 223   Basic Metabolic Panel: Recent Labs  Lab 09/08/20 1103 09/10/20 0847 09/11/20 0605  NA 138 135 136  K 4.5 4.3 4.3  CL 103 102 103  CO2 26 25 27   GLUCOSE 180* 171* 116*  BUN 21 33* 23  CREATININE 0.98 1.07 0.91  CALCIUM 9.2 8.7* 8.4*   GFR: Estimated Creatinine Clearance: 57.1 mL/min (by C-G formula based on SCr of 0.91 mg/dL). Liver Function Tests: Recent Labs  Lab 09/08/20 1103 09/10/20 0847  AST 20 18  ALT 17 13  ALKPHOS 73 66  BILITOT 0.5 0.5  PROT 6.8 6.4*  ALBUMIN 3.5 3.1*   No results for input(s): LIPASE, AMYLASE in the last 168 hours. No results for input(s): AMMONIA in the last 168 hours. Coagulation Profile: Recent Labs  Lab 09/08/20 1103  INR 1.2   Cardiac Enzymes: No results for input(s): CKTOTAL, CKMB, CKMBINDEX, TROPONINI in the last 168 hours. BNP (last 3 results) No results for input(s): PROBNP in the last 8760 hours. HbA1C: Recent Labs    09/10/20 1645  HGBA1C 7.6*   CBG: Recent Labs  Lab 09/10/20  1704 09/10/20 2141 09/11/20 0818 09/11/20 1237  GLUCAP 131* 182* 108* 243*   Lipid Profile: No results for input(s): CHOL, HDL, LDLCALC, TRIG, CHOLHDL, LDLDIRECT in the last 72 hours. Thyroid Function Tests: No results for input(s): TSH, T4TOTAL, FREET4, T3FREE, THYROIDAB in the last 72 hours. Anemia Panel: No results for input(s): VITAMINB12, FOLATE, FERRITIN, TIBC, IRON, RETICCTPCT in the last 72 hours. Sepsis Labs: Recent Labs  Lab 09/08/20 1147  LATICACIDVEN 1.1    Recent Results (from the past 240 hour(s))  Urine Culture     Status: Abnormal   Collection Time: 09/08/20 11:03 AM   Specimen: Urine, Random  Result Value Ref Range Status   Specimen Description   Final    URINE, RANDOM Performed at [facility], [address], [place], [place] [address]    Special Requests   Final    NONE Performed at [facility],  [address] [place], [place] [address]    Culture (A)  Final    >=100,000 COLONIES/mL PANTOEA SPECIES >=100,000 COLONIES/mL PSEUDOMONAS PUTIDA    Report Status 09/11/2020 FINAL  Final   Organism ID, Bacteria PANTOEA SPECIES (A)  Final   Organism ID, Bacteria PSEUDOMONAS PUTIDA (A)  Final      Susceptibility   Pantoea species - MIC*    CEFTAZIDIME <=1 SENSITIVE Sensitive     CIPROFLOXACIN <=0.25 SENSITIVE Sensitive     GENTAMICIN <=1 SENSITIVE Sensitive     IMIPENEM 0.5 SENSITIVE Sensitive     PIP/TAZO <=4 SENSITIVE Sensitive     CEFEPIME <=0.12 SENSITIVE Sensitive     * >=100,000 COLONIES/mL PANTOEA SPECIES   Pseudomonas putida - MIC*    CEFTAZIDIME 4 SENSITIVE Sensitive     CIPROFLOXACIN <=0.25 SENSITIVE Sensitive     GENTAMICIN <=1 SENSITIVE Sensitive     IMIPENEM 1 SENSITIVE Sensitive     PIP/TAZO 8 SENSITIVE Sensitive     * >=100,000 COLONIES/mL PSEUDOMONAS PUTIDA  Culture, blood (routine x 2)     Status: None (Preliminary result)   Collection Time: 09/08/20 11:47 AM   Specimen: BLOOD  Result Value Ref Range Status   Specimen Description BLOOD LEFT HAND  Final   Special Requests   Final    BOTTLES DRAWN AEROBIC AND ANAEROBIC Blood Culture adequate volume   Culture   Final    NO GROWTH 3 DAYS Performed at [facility], [address], [place], [place] [address]    Report Status PENDING  Incomplete  Culture, blood (routine x 2)     Status: None (Preliminary result)   Collection Time: 09/08/20 11:47 AM   Specimen: BLOOD  Result Value Ref Range Status   Specimen Description BLOOD LEFT AC  Final   Special Requests   Final    BOTTLES DRAWN AEROBIC AND ANAEROBIC Blood Culture results may not be optimal due to an inadequate volume of blood received in culture bottles   Culture   Final    NO GROWTH 3 DAYS Performed at [facility], [address], [place], [place] [address]    Report Status PENDING  Incomplete  Resp Panel by RT-PCR (Flu A&B, Covid)  Nasopharyngeal Swab     Status: None   Collection Time: 09/08/20 11:47 AM   Specimen: Nasopharyngeal Swab; Nasopharyngeal(NP) swabs in vial transport medium  Result Value Ref Range Status   SARS Coronavirus 2 by RT PCR NEGATIVE NEGATIVE Final    Comment: (NOTE) SARS-CoV-2 target nucleic acids are NOT DETECTED.  The SARS-CoV-2 RNA is generally  detectable in upper respiratory specimens during the acute phase of infection. The lowest concentration of SARS-CoV-2 viral copies this assay can detect is 138 copies/mL. A negative result does not preclude SARS-Cov-2 infection and should not be used as the sole basis for treatment or other patient management decisions. A negative result may occur with  improper specimen collection/handling, submission of specimen other than nasopharyngeal swab, presence of viral mutation(s) within the areas targeted by this assay, and inadequate number of viral copies(<138 copies/mL). A negative result must be combined with clinical observations, patient history, and epidemiological information. The expected result is Negative.  Fact Sheet for Patients:  BloggerCourse.comhttps://www.fda.gov/media/152166/download  Fact Sheet for Healthcare Providers:  SeriousBroker.ithttps://www.fda.gov/media/152162/download  This test is no t yet approved or cleared by the [place] FDA and  has been authorized for detection and/or diagnosis of SARS-CoV-2 by FDA under an Emergency Use Authorization (EUA). This EUA will remain  in effect (meaning this test can be used) for the duration of the COVID-19 declaration under Section 564(b)(1) of the Act, 21 U.S.C.section 360bbb-3(b)(1), unless the authorization is terminated  or revoked sooner.       Influenza A by PCR NEGATIVE NEGATIVE Final   Influenza B by PCR NEGATIVE NEGATIVE Final    Comment: (NOTE) The Xpert Xpress SARS-CoV-2/FLU/RSV plus assay is intended as an aid in the diagnosis of influenza from Nasopharyngeal swab specimens and should not be  used as a sole basis for treatment. Nasal washings and aspirates are unacceptable for Xpert Xpress SARS-CoV-2/FLU/RSV testing.  Fact Sheet for Patients: BloggerCourse.comhttps://www.fda.gov/media/152166/download  Fact Sheet for Healthcare Providers: SeriousBroker.ithttps://www.fda.gov/media/152162/download  This test is not yet approved or cleared by the [place] FDA and has been authorized for detection and/or diagnosis of SARS-CoV-2 by FDA under an Emergency Use Authorization (EUA). This EUA will remain in effect (meaning this test can be used) for the duration of the COVID-19 declaration under Section 564(b)(1) of the Act, 21 U.S.C. section 360bbb-3(b)(1), unless the authorization is terminated or revoked.  Performed at [facility], [address] [place], [place] [address]   Urine culture     Status: None   Collection Time: 09/10/20  9:40 AM   Specimen: Urine, Random  Result Value Ref Range Status   Specimen Description   Final    URINE, RANDOM Performed at [facility], [address] [place], [place] [address]    Special Requests   Final    NONE Performed at [facility], [address] [place], [place] [address]    Culture   Final    NO GROWTH Performed at [facility], [address] [place], [place] [address]    Report Status 09/11/2020 FINAL  Final  Resp Panel by RT-PCR (Flu A&B, Covid) Nasopharyngeal Swab     Status: None   Collection Time: 09/10/20  1:51 PM   Specimen: Nasopharyngeal Swab; Nasopharyngeal(NP) swabs in vial transport medium  Result Value Ref Range Status   SARS Coronavirus 2 by RT PCR NEGATIVE NEGATIVE Final    Comment: (NOTE) SARS-CoV-2 target nucleic acids are NOT DETECTED.  The SARS-CoV-2 RNA is generally detectable in upper respiratory specimens during the acute phase of infection. The lowest concentration of SARS-CoV-2 viral copies this assay can detect is 138 copies/mL. A negative result does not preclude  SARS-Cov-2 infection and should not be used as the sole basis for treatment or other patient management decisions. A negative result may occur with  improper specimen collection/handling, submission of specimen other than  nasopharyngeal swab, presence of viral mutation(s) within the areas targeted by this assay, and inadequate number of viral copies(<138 copies/mL). A negative result must be combined with clinical observations, patient history, and epidemiological information. The expected result is Negative.  Fact Sheet for Patients:  BloggerCourse.com  Fact Sheet for Healthcare Providers:  SeriousBroker.it  This test is no t yet approved or cleared by the [place] FDA and  has been authorized for detection and/or diagnosis of SARS-CoV-2 by FDA under an Emergency Use Authorization (EUA). This EUA will remain  in effect (meaning this test can be used) for the duration of the COVID-19 declaration under Section 564(b)(1) of the Act, 21 U.S.C.section 360bbb-3(b)(1), unless the authorization is terminated  or revoked sooner.       Influenza A by PCR NEGATIVE NEGATIVE Final   Influenza B by PCR NEGATIVE NEGATIVE Final    Comment: (NOTE) The Xpert Xpress SARS-CoV-2/FLU/RSV plus assay is intended as an aid in the diagnosis of influenza from Nasopharyngeal swab specimens and should not be used as a sole basis for treatment. Nasal washings and aspirates are unacceptable for Xpert Xpress SARS-CoV-2/FLU/RSV testing.  Fact Sheet for Patients: BloggerCourse.com  Fact Sheet for Healthcare Providers: SeriousBroker.it  This test is not yet approved or cleared by the [place] FDA and has been authorized for detection and/or diagnosis of SARS-CoV-2 by FDA under an Emergency Use Authorization (EUA). This EUA will remain in effect (meaning this test can be used) for the duration of  the COVID-19 declaration under Section 564(b)(1) of the Act, 21 U.S.C. section 360bbb-3(b)(1), unless the authorization is terminated or revoked.  Performed at [facility], [address] [place], [place] [address]   MRSA PCR Screening     Status: None   Collection Time: 09/10/20  6:32 PM   Specimen: Nasopharyngeal  Result Value Ref Range Status   MRSA by PCR NEGATIVE NEGATIVE Final    Comment:        The GeneXpert MRSA Assay (FDA approved for NASAL specimens only), is one component of a comprehensive MRSA colonization surveillance program. It is not intended to diagnose MRSA infection nor to guide or monitor treatment for MRSA infections. Performed at [facility], [address], [place], [place] [address]          Radiology Studies: US Venous Img Lower Unilateral Left (DVT)  Result Date: 09/11/2020 CLINICAL DATA:  Swelling EXAM: LEFT LOWER EXTREMITY VENOUS DOPPLER ULTRASOUND TECHNIQUE: Gray-scale sonography with compression, as well as color and duplex ultrasound, were performed to evaluate the deep venous system(s) from the level of the common femoral vein through the popliteal and proximal calf veins. COMPARISON:  None. FINDINGS: VENOUS Normal compressibility of the common femoral, superficial femoral, and popliteal veins, as well as the visualized calf veins. Visualized portions of profunda femoral vein and great saphenous vein unremarkable. No filling defects to suggest DVT on grayscale or color Doppler imaging. Doppler waveforms show normal direction of venous flow, normal respiratory phasicity and response to augmentation. Limited views of the contralateral common femoral vein are unremarkable. OTHER None. Limitations: Technologist describes technically difficult study secondary to calf edema. IMPRESSION: No femoropopliteal DVT nor evidence of DVT within the visualized calf veins. If clinical symptoms are inconsistent or if there are persistent or  worsening symptoms, further imaging (possibly involving the iliac veins) may be warranted. Electronically Signed   By: Corlis Leak M.D.   On: 09/11/2020 07:46   CT HEMATURIA WORKUP  Result Date: 09/10/2020 CLINICAL DATA:  Hematuria and dysuria. EXAM: CT ABDOMEN AND PELVIS WITHOUT AND WITH CONTRAST TECHNIQUE: Multidetector CT imaging of the abdomen and pelvis was performed following the standard protocol before and following the bolus administration of intravenous contrast. CONTRAST:  OMNIPAQUE IOHEXOL 300 MG/ML  SOLN COMPARISON:  None. FINDINGS: Lower chest: Emphysema. Ground-glass nodule in the posterior right middle lobe of 2.4 cm on 04/05. Mild cardiomegaly. Pacer. Coronary artery atherosclerosis. Tiny hiatal hernia. Trace left pleural fluid. Hepatobiliary: Focal steatosis adjacent the falciform ligament. Areas of hypoattenuation along the capsule of the right lobe of the liver measure less than 1 cm and may also represent focal steatosis, including on 31/7. Cholecystectomy, without biliary ductal dilatation. Pancreas: Normal, without mass or ductal dilatation. Spleen: Normal in size, without focal abnormality. Adrenals/Urinary Tract: Normal adrenal glands. Left renal vascular calcifications. No renal calculi or hydronephrosis. No hydroureter or ureteric calculi. No bladder calculi. Interpolar left renal 2.5 cm cyst. Other bilateral too small to characterize renal lesions. Moderate renal collecting system opacification on delayed images. Portions of both ureters are suboptimally opacified on delayed images. No collecting system or ureteric filling defect. Foley catheter within the urinary bladder. Hyperattenuating material within consistent with hemorrhage on precontrast image 68/2. The bladder wall is mildly thickened and hyperenhancing after contrast. No well-defined bladder mass. No other filling defect on delayed images, somewhat limited by lack of bladder distension. Stomach/Bowel: Normal remainder  of the stomach. Scattered colonic diverticula. Nonobstructive distal and terminal ileum position within the right inguinal canal on 72/7. periampullary duodenal diverticulum. Otherwise normal small bowel. Vascular/Lymphatic: Advanced aortic and branch vessel atherosclerosis. No abdominopelvic adenopathy. Reproductive: Moderate prostatomegaly. Other: No significant free fluid. Fat containing left inguinal hernia. Subcutaneous edema about the left proximal thigh, including on 103/7. Musculoskeletal: Advanced lumbosacral spondylosis with mild convex right lumbar spine curvature. IMPRESSION: 1. Hemorrhage within the bladder. Bladder wall thickening and mucosal hyperenhancement, suspicious for cystitis. A component of the wall thickening could be related to outlet obstruction in this patient with prostatomegaly. 2. No upper tract explanation for hematuria. 3. Posterior right middle lobe ground-glass nodule of 2.4 cm. Per consensus criteria, this warrants follow-up with chest CT at 6 months (if this 86 year old patient is a therapy candidate). This recommendation follows the consensus statement: Guidelines for Management of Incidental Pulmonary Nodules Detected on CT Images:From the Fleischner Society 2017; published online before print (10.1148/radiol.[id number]). 4. Aortic atherosclerosis (ICD10-I70.0), coronary artery atherosclerosis and emphysema (ICD10-J43.9). 5.  Tiny hiatal hernia. 6. Bilateral inguinal hernias. The right-sided inguinal hernia contains nonobstructive small bowel. 7. Nonspecific subcutaneous edema about the left proximal thigh. Electronically Signed   By: Jeronimo Greaves M.D.   On: 09/10/2020 14:19    Scheduled Meds: . cholecalciferol   Oral Daily  . diltiazem  180 mg Oral Daily  . ezetimibe  10 mg Oral Daily  . ferrous sulfate  325 mg Oral QODAY  . finasteride  5 mg Oral Daily  . insulin aspart  0-15 Units Subcutaneous TID WC  . levothyroxine  125 mcg Oral QAC breakfast  . multivitamin with  minerals  1 tablet Oral Daily  . mupirocin ointment   Topical Daily  . sodium chloride flush  3 mL Intravenous Q12H  . tamsulosin  0.4 mg Oral QHS  . cyanocobalamin  1,000 mcg Oral Daily   Continuous Infusions: . sodium chloride    . sodium chloride irrigation Stopped (09/10/20 1111)  . sodium chloride irrigation       LOS: 1 day    Time spent: 35  mins    Cipriano Bunker, MD [facility]   If 7PM-7AM, please contact night-coverage

## 2020-09-11 NOTE — Progress Notes (Signed)
Patient ID: [name] [name], male   DOB: 01-09-1928, 86 y.o.   MRN: [id number]   I spoke with the patient's daughter regarding the patients current condition and history and reinforced the need for Urologic f/u with cystoscopy.    She would like for him to continue care at [facility] since he has been seen there for 25 years.   She will call to arrange the appointment with Urology and Vascular surgery.    His PSA is 0.98 which is an increase from 0.13 in 2016.  It remains low but suggests the possibility of a recurrent prostate cancer.

## 2020-09-12 DIAGNOSIS — R310 Gross hematuria: Secondary | ICD-10-CM | POA: Diagnosis not present

## 2020-09-12 LAB — GLUCOSE, CAPILLARY
Glucose-Capillary: 120 mg/dL — ABNORMAL HIGH (ref 70–99)
Glucose-Capillary: 121 mg/dL — ABNORMAL HIGH (ref 70–99)
Glucose-Capillary: 123 mg/dL — ABNORMAL HIGH (ref 70–99)
Glucose-Capillary: 153 mg/dL — ABNORMAL HIGH (ref 70–99)
Glucose-Capillary: 276 mg/dL — ABNORMAL HIGH (ref 70–99)

## 2020-09-12 NOTE — Evaluation (Signed)
Physical Therapy Evaluation Patient Details Name: [name] [name] Full MRN: [id number] DOB: 1928-04-28 Today's Date: 09/12/2020   History of Present Illness  86 year old male with PMH significant for peripheral arterial disease with chronic wound involving left fourth and fifth toe for which he is on antibiotic therapy, history of urinary retention with indwelling Foley catheter, diabetes mellitus and hypertension who was sent to emergency room from assisted living for the evaluation of hematuria.  Clinical Impression  Pt seen for PT evaluation. Pt currently mod I for bed mobility but CGA/supervision for transfers & gait with RW. Pt demonstrates BLE foot drop & compensatory gait pattern. Pt notes he has BLE braces to help him walk longer distances but doesn't have them here. Will continue to follow pt to focus on gait and balance.     Follow Up Recommendations Home health PT;Supervision/Assistance - 24 hour    Equipment Recommendations  None recommended by PT    Recommendations for Other Services       Precautions / Restrictions Precautions Precautions: Fall Precaution Comments: pt has B darco shoes he prefers to ambulate in Restrictions Weight Bearing Restrictions: No      Mobility  Bed Mobility Overal bed mobility: Modified Independent Bed Mobility: Supine to Sit     Supine to sit: Modified independent (Device/Increase time);HOB elevated          Transfers Overall transfer level: Needs assistance   Transfers: Sit to/from Stand Sit to Stand: Supervision;Min guard         General transfer comment: cuing to push to stand vs pulling on RW  Ambulation/Gait Ambulation/Gait assistance: Supervision;Min guard Gait Distance (Feet): 200 Feet Assistive device: Rolling walker (2 wheeled) Gait Pattern/deviations: Step-through pattern Gait velocity: slightly decreased   General Gait Details: BLE foot drop so pt with compensatory pattern (excessive BLE hip & knee flexion during  swing phase)  Stairs            Wheelchair Mobility    Modified Rankin (Stroke Patients Only)       Balance Overall balance assessment: Mild deficits observed, not formally tested;Needs assistance Sitting-balance support: No upper extremity supported;Feet supported Sitting balance-Leahy Scale: Good     Standing balance support: Bilateral upper extremity supported;During functional activity Standing balance-Leahy Scale: Fair Standing balance comment: BUE support on RW during standing/gait                             Pertinent Vitals/Pain Pain Assessment: Faces Faces Pain Scale: No hurt    Home Living Family/patient expects to be discharged to:: Assisted living               Home Equipment: Walker - 2 wheels (rollator)      Prior Function Level of Independence: Independent with assistive device(s);Needs assistance      ADL's / Homemaking Assistance Needed: requires assistance for donning B darco shoes        Hand Dominance        Extremity/Trunk Assessment   Upper Extremity Assessment Upper Extremity Assessment: Overall WFL for tasks assessed    Lower Extremity Assessment Lower Extremity Assessment:  (pt with B foot drop (reports he wears braces to allow him to ambulate longer distances but notes they aren't hear, suspect they may be to assist with foot drop), otherwise strength WFL)       Communication   Communication: HOH (has earing aides but none hear in hospital)  Cognition Arousal/Alertness: Awake/alert Behavior During  Therapy: WFL for tasks assessed/performed Overall Cognitive Status: Within Functional Limits for tasks assessed                                        General Comments General comments (skin integrity, edema, etc.): Pt declines wearing sock on LLE, wears BLE Darco shoes during gait, pt occasionally bumps RW with LE & requires cuing to ambulate within base of AD when turning    Exercises      Assessment/Plan    PT Assessment Patient needs continued PT services  PT Problem List Decreased mobility;Decreased activity tolerance;Decreased balance;Decreased skin integrity       PT Treatment Interventions      PT Goals (Current goals can be found in the Care Plan section)  Acute Rehab PT Goals Patient Stated Goal: none stated PT Goal Formulation: With patient Time For Goal Achievement: 09/26/20 Potential to Achieve Goals: Good    Frequency Min 2X/week   Barriers to discharge        Co-evaluation               AM-PAC PT "6 Clicks" Mobility  Outcome Measure Help needed turning from your back to your side while in a flat bed without using bedrails?: None Help needed moving from lying on your back to sitting on the side of a flat bed without using bedrails?: None Help needed moving to and from a bed to a chair (including a wheelchair)?: A Little Help needed standing up from a chair using your arms (e.g., wheelchair or bedside chair)?: A Little Help needed to walk in hospital room?: A Little Help needed climbing 3-5 steps with a railing? : A Lot 6 Click Score: 19    End of Session Equipment Utilized During Treatment: Gait belt Activity Tolerance: Patient tolerated treatment well Patient left: in chair;with chair alarm set;with call bell/phone within reach Nurse Communication: Mobility status PT Visit Diagnosis: Unsteadiness on feet (R26.81);Muscle weakness (generalized) (M62.81)    Time: 5809-9833 PT Time Calculation (min) (ACUTE ONLY): 20 min   Charges:   PT Evaluation $PT Eval Low Complexity: 1 Low PT Treatments $Therapeutic Activity: 8-22 mins        Aleda Grana, PT, DPT 09/12/20, 4:21 PM   Sandi Mariscal 09/12/2020, 4:19 PM

## 2020-09-12 NOTE — Progress Notes (Signed)
PROGRESS NOTE    [name] [name]  YQM:[id number] DOB: [birth year] DOA: 09/10/2020 PCP: Zenaida Niece, MD    Brief Narrative:  This 86 years old male with PMH significant for peripheral arterial disease with chronic wound involving left fourth and fifth toe for which he is on antibiotic therapy, history of urinary retention with indwelling Foley catheter, diabetes mellitus and hypertension who was sent to emergency room from assisted living for the evaluation of hematuria. He was seen 2 days ago at the hospital for malfunctioning of his Foley catheter which was replaced and patient was discharged with outpatient urology follow-up.  CT scanning of abdomen pelvis showed hemorrhage within the bladder bladder wall thickening and mucosal hyperenhancement suspicious for cystitis.  Left foot x-ray shows erosive changes in the distal aspect of fifth proximal phalanx consistent with osteomyelitis.  Patient also found to have a 1 g drop in hemoglobin. Patient was admitted for gross hematuria and chronic left fifth toe osteomyelitis.  Urology was consulted recommended to continue CBI and outpatient follow-up with [facility] urology.  Podiatry was consulted which recommended amputation of left fifth toe given chronic osteomyelitis.  Patient want to get second opinion from his [facility] vascular surgery before considering amputation.   Assessment & Plan:   Principal Problem:   Gross hematuria Active Problems:   Diabetes mellitus without complication (HCC)   Hypertension   PAD (peripheral artery disease) (HCC)   Osteomyelitis, chronic, lower leg, left (HCC)   Hypothyroid   Lung nodule seen on imaging study  Gross hematuria could be secondary to hemorrhagic cystitis:>>> Resolved. Patient has indwelling Foley catheter for urinary retention for some time. He was seen 2 days ago for malfunctioning urinary catheter,  it was replaced and patient was discharged. Unknown etiology at this point,  could be traumatic  catheterization. CT scan of abdomen and pelvis done with and without contrast shows possible hemorrhagic cystitis Patient was on Eliquis, aspirin and Plavix and is noted to have a 1 g drop in his H&H over the last 48 hours (9.3g/dl >> 5.2W/UX) We will hold anticoagulant for the next 24 hours and repeat H&H. Urology consulted recommended to continue aspirin and Plavix given recent endovascular procedure. Consider voiding trial if urine clears in the morning. Follow-up results of urine culture Continue CBI until urine is clear CBI discontinued after urine clears. Patient will follow up with [facility] urology after discharge.  Diabetes mellitus: Maintain consistent carbohydrate diet Glycemic control with sliding scale insulin  CKD stage IIIa: Renal functions at baseline.   Avoid nephrotoxic medications.  BPH Continue Flomax and finasteride  Hypertension Continue diltiazem  Hypothyroidism Continue Synthroid  Lung nodule Incidental finding Noted in the posterior right middle lobe Repeat imaging has been recommended in the next 6 months if patient is deemed a therapy candidate   Chronic left fifth toe osteomyelitis Patient follows up at [facility] and from chart review: he was initially treated with IV antibiotics for osteomyelitis although then ID was consulted and the recommendation was to stop antibiotics, because the osteomyelitis appeared chronic and definitive treatment is surgical.  X-ray on 12/2 demonstrated osseous resorption in the proximal distal phalanx of the fifth toe suggestive of osteomyelitis. This is the same as the findings on the x-ray from 12/23 Podiatry consulted recommended amputation of left fifth toe.  Patient declined at this point. Patient want to get second opinion from his vascular surgeon before considering amputation. Patient follows with physicians in [facility] system prefers to be seen in the same system. Podiatry signed  off.   DVT prophylaxis:  SCDs Code Status: DNR Family Communication: No family at bedside Disposition Plan:   Status is: Inpatient  Remains inpatient appropriate because:Hemodynamically unstable and Inpatient level of care appropriate due to severity of illness   Dispo: The patient is from: Home              Anticipated d/c is to: Assisted living              Anticipated d/c date is: 09/12/20              Patient currently is not medically stable to d/c.  Consultants:   Podiatry  Urology  Procedures: Antimicrobials: Anti-infectives (From admission, onward)   None      Subjective: Patient was seen and examined at bedside.  Overnight events noted.   His urine cleared, CBI discontinued.  Patient reports improvement in pain, Foley removed , having voiding trial .  Objective: Vitals:   09/11/20 2034 09/12/20 0004 09/12/20 0506 09/12/20 1211  BP: (!) 126/58 (!) 130/51 (!) 132/53 (!) 124/54  Pulse: 60 (!) 110 (!) 59 (!) 59  Resp: Temp: 98.2 F (36.8 C) 98.6 F (37 C) 98.4 F (36.9 C) 98.5 F (36.9 C)  TempSrc: Oral Oral Oral   SpO2: 97% 96% 96%   Weight:      Height:        Intake/Output Summary (Last 24 hours) at 09/12/2020 1527 Last data filed at 09/12/2020 1300 Gross per 24 hour  Intake 840 ml  Output 3550 ml  Net -2710 ml   Filed Weights   09/10/20 0826  Weight: 74.8 kg    Examination:  General exam: Appears calm and comfortable, not in any distress Respiratory system: Clear to auscultation. Respiratory effort normal. Cardiovascular system: S1 & S2 heard, RRR. No JVD, murmurs, rubs, gallops or clicks. No pedal edema. Gastrointestinal system: Abdomen is nondistended, soft and nontender. No organomegaly or masses felt. Normal bowel sounds heard. Central nervous system: Alert and oriented. No focal neurological deficits. Extremities: Left fifth and fourth toe erythematous with wound. Skin: No rashes, lesions or ulcers Psychiatry: Judgement and insight appear  normal. Mood & affect appropriate.     Data Reviewed: I have personally reviewed following labs and imaging studies  CBC: Recent Labs  Lab 09/08/20 1103 09/10/20 0847 09/11/20 0605 09/11/20 1436  WBC 10.9* 9.5 7.3  --   NEUTROABS 9.2* 8.0*  --   --   HGB 9.3* 8.3* 7.9* 7.7*  HCT 30.0* 26.0* 24.5* 24.8*  MCV 89.6 88.4 88.1  --   PLT 272 230 223  --    Basic Metabolic Panel: Recent Labs  Lab 09/08/20 1103 09/10/20 0847 09/11/20 0605  NA 138 135 136  K 4.5 4.3 4.3  CL 103 102 103  CO2 GLUCOSE 180* 171* 116*  BUN 21 33* 23  CREATININE 0.98 1.07 0.91  CALCIUM 9.2 8.7* 8.4*   GFR: Estimated Creatinine Clearance: 57.1 mL/min (by C-G formula based on SCr of 0.91 mg/dL). Liver Function Tests: Recent Labs  Lab 09/08/20 1103 09/10/20 0847  AST 20 18  ALT 17 13  ALKPHOS 73 66  BILITOT 0.5 0.5  PROT 6.8 6.4*  ALBUMIN 3.5 3.1*   No results for input(s): LIPASE, AMYLASE in the last 168 hours. No results for input(s): AMMONIA in the last 168 hours. Coagulation Profile: Recent Labs  Lab 09/08/20 1103  INR 1.2   Cardiac Enzymes: No results  for input(s): CKTOTAL, CKMB, CKMBINDEX, TROPONINI in the last 168 hours. BNP (last 3 results) No results for input(s): PROBNP in the last 8760 hours. HbA1C: Recent Labs    09/10/20 1645  HGBA1C 7.6*   CBG: Recent Labs  Lab 09/11/20 1440 09/11/20 1750 09/12/20 0003 09/12/20 0805 09/12/20 1209  GLUCAP 270* 90 121* 120* 276*   Lipid Profile: No results for input(s): CHOL, HDL, LDLCALC, TRIG, CHOLHDL, LDLDIRECT in the last 72 hours. Thyroid Function Tests: No results for input(s): TSH, T4TOTAL, FREET4, T3FREE, THYROIDAB in the last 72 hours. Anemia Panel: No results for input(s): VITAMINB12, FOLATE, FERRITIN, TIBC, IRON, RETICCTPCT in the last 72 hours. Sepsis Labs: Recent Labs  Lab 09/08/20 1147  LATICACIDVEN 1.1    Recent Results (from the past 240 hour(s))  Urine Culture     Status: Abnormal    Collection Time: 09/08/20 11:03 AM   Specimen: Urine, Random  Result Value Ref Range Status   Specimen Description   Final    URINE, RANDOM Performed at [facility], [address], [place], [place] [address]    Special Requests   Final    NONE Performed at [facility], [address] [place], [place] [address]    Culture (A)  Final    >=100,000 COLONIES/mL PANTOEA SPECIES >=100,000 COLONIES/mL PSEUDOMONAS PUTIDA    Report Status 09/11/2020 FINAL  Final   Organism ID, Bacteria PANTOEA SPECIES (A)  Final   Organism ID, Bacteria PSEUDOMONAS PUTIDA (A)  Final      Susceptibility   Pantoea species - MIC*    CEFTAZIDIME <=1 SENSITIVE Sensitive     CIPROFLOXACIN <=0.25 SENSITIVE Sensitive     GENTAMICIN <=1 SENSITIVE Sensitive     IMIPENEM 0.5 SENSITIVE Sensitive     PIP/TAZO <=4 SENSITIVE Sensitive     CEFEPIME <=0.12 SENSITIVE Sensitive     * >=100,000 COLONIES/mL PANTOEA SPECIES   Pseudomonas putida - MIC*    CEFTAZIDIME 4 SENSITIVE Sensitive     CIPROFLOXACIN <=0.25 SENSITIVE Sensitive     GENTAMICIN <=1 SENSITIVE Sensitive     IMIPENEM 1 SENSITIVE Sensitive     PIP/TAZO 8 SENSITIVE Sensitive     * >=100,000 COLONIES/mL PSEUDOMONAS PUTIDA  Culture, blood (routine x 2)     Status: None (Preliminary result)   Collection Time: 09/08/20 11:47 AM   Specimen: BLOOD  Result Value Ref Range Status   Specimen Description BLOOD LEFT HAND  Final   Special Requests   Final    BOTTLES DRAWN AEROBIC AND ANAEROBIC Blood Culture adequate volume   Culture   Final    NO GROWTH 4 DAYS Performed at [facility], [address], [place], [place] [address]    Report Status PENDING  Incomplete  Culture, blood (routine x 2)     Status: None (Preliminary result)   Collection Time: 09/08/20 11:47 AM   Specimen: BLOOD  Result Value Ref Range Status   Specimen Description BLOOD LEFT AC  Final   Special Requests   Final    BOTTLES DRAWN AEROBIC AND ANAEROBIC  Blood Culture results may not be optimal due to an inadequate volume of blood received in culture bottles   Culture   Final    NO GROWTH 4 DAYS Performed at [facility], [address] [place], [place] [address]    Report Status PENDING  Incomplete  Resp Panel by RT-PCR (Flu A&B, Covid) Nasopharyngeal Swab     Status: None   Collection Time: 09/08/20 11:47  AM   Specimen: Nasopharyngeal Swab; Nasopharyngeal(NP) swabs in vial transport medium  Result Value Ref Range Status   SARS Coronavirus 2 by RT PCR NEGATIVE NEGATIVE Final    Comment: (NOTE) SARS-CoV-2 target nucleic acids are NOT DETECTED.  The SARS-CoV-2 RNA is generally detectable in upper respiratory specimens during the acute phase of infection. The lowest concentration of SARS-CoV-2 viral copies this assay can detect is 138 copies/mL. A negative result does not preclude SARS-Cov-2 infection and should not be used as the sole basis for treatment or other patient management decisions. A negative result may occur with  improper specimen collection/handling, submission of specimen other than nasopharyngeal swab, presence of viral mutation(s) within the areas targeted by this assay, and inadequate number of viral copies(<138 copies/mL). A negative result must be combined with clinical observations, patient history, and epidemiological information. The expected result is Negative.  Fact Sheet for Patients:  BloggerCourse.com  Fact Sheet for Healthcare Providers:  SeriousBroker.it  This test is no t yet approved or cleared by the [place] FDA and  has been authorized for detection and/or diagnosis of SARS-CoV-2 by FDA under an Emergency Use Authorization (EUA). This EUA will remain  in effect (meaning this test can be used) for the duration of the COVID-19 declaration under Section 564(b)(1) of the Act, 21 U.S.C.section 360bbb-3(b)(1), unless the authorization is  terminated  or revoked sooner.       Influenza A by PCR NEGATIVE NEGATIVE Final   Influenza B by PCR NEGATIVE NEGATIVE Final    Comment: (NOTE) The Xpert Xpress SARS-CoV-2/FLU/RSV plus assay is intended as an aid in the diagnosis of influenza from Nasopharyngeal swab specimens and should not be used as a sole basis for treatment. Nasal washings and aspirates are unacceptable for Xpert Xpress SARS-CoV-2/FLU/RSV testing.  Fact Sheet for Patients: BloggerCourse.com  Fact Sheet for Healthcare Providers: SeriousBroker.it  This test is not yet approved or cleared by the [place] FDA and has been authorized for detection and/or diagnosis of SARS-CoV-2 by FDA under an Emergency Use Authorization (EUA). This EUA will remain in effect (meaning this test can be used) for the duration of the COVID-19 declaration under Section 564(b)(1) of the Act, 21 U.S.C. section 360bbb-3(b)(1), unless the authorization is terminated or revoked.  Performed at [facility], [address] [place], [place] [address]   Urine culture     Status: None   Collection Time: 09/10/20  9:40 AM   Specimen: Urine, Random  Result Value Ref Range Status   Specimen Description   Final    URINE, RANDOM Performed at [facility], [address], [place], [place] [address]    Special Requests   Final    NONE Performed at [facility], [address] [place], [place] [address]    Culture   Final    NO GROWTH Performed at [facility], [address] [place], [place] [address]    Report Status 09/11/2020 FINAL  Final  Resp Panel by RT-PCR (Flu A&B, Covid) Nasopharyngeal Swab     Status: None   Collection Time: 09/10/20  1:51 PM   Specimen: Nasopharyngeal Swab; Nasopharyngeal(NP) swabs in vial transport medium  Result Value Ref Range Status   SARS Coronavirus 2 by RT PCR NEGATIVE NEGATIVE Final    Comment:  (NOTE) SARS-CoV-2 target nucleic acids are NOT DETECTED.  The SARS-CoV-2 RNA is generally detectable in upper respiratory specimens during the acute phase of infection. The lowest concentration of SARS-CoV-2 viral  copies this assay can detect is 138 copies/mL. A negative result does not preclude SARS-Cov-2 infection and should not be used as the sole basis for treatment or other patient management decisions. A negative result may occur with  improper specimen collection/handling, submission of specimen other than nasopharyngeal swab, presence of viral mutation(s) within the areas targeted by this assay, and inadequate number of viral copies(<138 copies/mL). A negative result must be combined with clinical observations, patient history, and epidemiological information. The expected result is Negative.  Fact Sheet for Patients:  BloggerCourse.comhttps://www.fda.gov/media/152166/download  Fact Sheet for Healthcare Providers:  SeriousBroker.ithttps://www.fda.gov/media/152162/download  This test is no t yet approved or cleared by the [place] FDA and  has been authorized for detection and/or diagnosis of SARS-CoV-2 by FDA under an Emergency Use Authorization (EUA). This EUA will remain  in effect (meaning this test can be used) for the duration of the COVID-19 declaration under Section 564(b)(1) of the Act, 21 U.S.C.section 360bbb-3(b)(1), unless the authorization is terminated  or revoked sooner.       Influenza A by PCR NEGATIVE NEGATIVE Final   Influenza B by PCR NEGATIVE NEGATIVE Final    Comment: (NOTE) The Xpert Xpress SARS-CoV-2/FLU/RSV plus assay is intended as an aid in the diagnosis of influenza from Nasopharyngeal swab specimens and should not be used as a sole basis for treatment. Nasal washings and aspirates are unacceptable for Xpert Xpress SARS-CoV-2/FLU/RSV testing.  Fact Sheet for Patients: BloggerCourse.comhttps://www.fda.gov/media/152166/download  Fact Sheet for Healthcare  Providers: SeriousBroker.ithttps://www.fda.gov/media/152162/download  This test is not yet approved or cleared by the [place] FDA and has been authorized for detection and/or diagnosis of SARS-CoV-2 by FDA under an Emergency Use Authorization (EUA). This EUA will remain in effect (meaning this test can be used) for the duration of the COVID-19 declaration under Section 564(b)(1) of the Act, 21 U.S.C. section 360bbb-3(b)(1), unless the authorization is terminated or revoked.  Performed at [facility], [address] [place], [place] [address]   MRSA PCR Screening     Status: None   Collection Time: 09/10/20  6:32 PM   Specimen: Nasopharyngeal  Result Value Ref Range Status   MRSA by PCR NEGATIVE NEGATIVE Final    Comment:        The GeneXpert MRSA Assay (FDA approved for NASAL specimens only), is one component of a comprehensive MRSA colonization surveillance program. It is not intended to diagnose MRSA infection nor to guide or monitor treatment for MRSA infections. Performed at [facility], [address] [place], [place] [address]          Radiology Studies: US Venous Img Lower Unilateral Left (DVT)  Result Date: 09/11/2020 CLINICAL DATA:  Swelling EXAM: LEFT LOWER EXTREMITY VENOUS DOPPLER ULTRASOUND TECHNIQUE: Gray-scale sonography with compression, as well as color and duplex ultrasound, were performed to evaluate the deep venous system(s) from the level of the common femoral vein through the popliteal and proximal calf veins. COMPARISON:  None. FINDINGS: VENOUS Normal compressibility of the common femoral, superficial femoral, and popliteal veins, as well as the visualized calf veins. Visualized portions of profunda femoral vein and great saphenous vein unremarkable. No filling defects to suggest DVT on grayscale or color Doppler imaging. Doppler waveforms show normal direction of venous flow, normal respiratory phasicity and response to augmentation.  Limited views of the contralateral common femoral vein are unremarkable. OTHER None. Limitations: Technologist describes technically difficult study secondary to calf edema. IMPRESSION: No femoropopliteal DVT nor evidence of DVT within the visualized calf veins. If  clinical symptoms are inconsistent or if there are persistent or worsening symptoms, further imaging (possibly involving the iliac veins) may be warranted. Electronically Signed   By: Corlis Leak M.D.   On: 09/11/2020 07:46    Scheduled Meds: . Chlorhexidine Gluconate Cloth  6 each Topical Daily  . cholecalciferol   Oral Daily  . diltiazem  180 mg Oral Daily  . ezetimibe  10 mg Oral Daily  . ferrous sulfate  325 mg Oral QODAY  . finasteride  5 mg Oral Daily  . insulin aspart  0-15 Units Subcutaneous TID WC  . levothyroxine  125 mcg Oral QAC breakfast  . multivitamin with minerals  1 tablet Oral Daily  . mupirocin ointment   Topical Daily  . sodium chloride flush  3 mL Intravenous Q12H  . tamsulosin  0.4 mg Oral QHS  . cyanocobalamin  1,000 mcg Oral Daily   Continuous Infusions: . sodium chloride       LOS: 2 days    Time spent: 25 mins    Cipriano Bunker, MD [facility]   If 7PM-7AM, please contact night-coverage

## 2020-09-12 NOTE — TOC Progression Note (Signed)
Transition of Care [facility]) - Progression Note    Patient Details  Name: [name] [name] MRN: [id number] Date of Birth: [birth year]  [facility] CM/SW Contact  Chapman Fitch, RN Phone Number: 09/12/2020, 2:16 PM  Clinical Narrative:     Clinical faxed to [facility].  Nursing will have to review clinical prior to determining if patient can return.  MD updated        Expected Discharge Plan and Services                                                 Social Determinants of Health (SDOH) Interventions    Readmission Risk Interventions No flowsheet data found.

## 2020-09-13 DIAGNOSIS — R310 Gross hematuria: Secondary | ICD-10-CM | POA: Diagnosis not present

## 2020-09-13 LAB — CULTURE, BLOOD (ROUTINE X 2)
Culture: NO GROWTH
Culture: NO GROWTH
Special Requests: ADEQUATE

## 2020-09-13 LAB — GLUCOSE, CAPILLARY
Glucose-Capillary: 147 mg/dL — ABNORMAL HIGH (ref 70–99)
Glucose-Capillary: 215 mg/dL — ABNORMAL HIGH (ref 70–99)
Glucose-Capillary: 146 mg/dL — ABNORMAL HIGH (ref 70–99)

## 2020-09-13 LAB — BASIC METABOLIC PANEL
Anion gap: 7 (ref 5–15)
BUN: 26 mg/dL — ABNORMAL HIGH (ref 8–23)
CO2: 25 mmol/L (ref 22–32)
Calcium: 8.5 mg/dL — ABNORMAL LOW (ref 8.9–10.3)
Chloride: 105 mmol/L (ref 98–111)
Creatinine, Ser: 0.94 mg/dL (ref 0.61–1.24)
GFR, Estimated: >60 mL/min (ref >60)
Glucose, Bld: 154 mg/dL — ABNORMAL HIGH (ref 70–99)
Potassium: 3.8 mmol/L (ref 3.5–5.1)
Sodium: 137 mmol/L (ref 135–145)

## 2020-09-13 LAB — HEMOGLOBIN AND HEMATOCRIT, BLOOD
HCT: 23.4 % — ABNORMAL LOW (ref 39.0–52.0)
HCT: 28.4 % — ABNORMAL LOW (ref 39.0–52.0)
Hemoglobin: 7.5 g/dL — ABNORMAL LOW (ref 13.0–17.0)
Hemoglobin: 8.9 g/dL — ABNORMAL LOW (ref 13.0–17.0)

## 2020-09-13 LAB — ABO/RH: ABO/RH(D): O POS

## 2020-09-13 LAB — PREPARE RBC (CROSSMATCH)

## 2020-09-13 MED ORDER — SODIUM CHLORIDE 0.9% IV SOLUTION: Freq: Once | INTRAVENOUS | Status: AC

## 2020-09-13 NOTE — Care Management Important Message (Signed)
Important Message  Patient Details  Name: [name] [name] MRN: [id number] Date of Birth: Dec 10, 1927   Medicare Important Message Given:  Yes     Johnell Comings 09/13/2020, 11:04 AM

## 2020-09-13 NOTE — Discharge Instructions (Signed)
Advised to follow-up with PCP Dr. Vedia Coffer on December 30,21. Advised to follow-up with urology at [facility] for hematuria. Advised to follow-up with vascular surgeon at [facility]. Advised to follow-up with podiatry at [facility] for second opinion about chronic osteomyelitis of left fifth toe.

## 2020-09-13 NOTE — Discharge Summary (Signed)
Physician Discharge Summary  [name] [name] ZOX:[id number] DOB: [birth year] DOA: 09/10/2020  PCP: Zenaida Niece, MD  Admit date: 09/10/2020   Discharge date: 09/13/2020  Admitted From: Assisted living facility.  Disposition: Assisted living facility.  Recommendations for Outpatient Follow-up:  1. Follow up with PCP Dr. Harrison Mons in 1-2 days. 2. Please obtain BMP/CBC in one week. 3. Advised to follow-up with urology at [facility] for hematuria. 4. Advised to follow-up with vascular surgeon at [facility]. 5. Advised to follow-up with podiatry at [facility] for second opinion about chronic osteomyelitis of left fifth toe.  Home Health: Yes.  RN Equipment/Devices: None  Discharge Condition: Stable CODE STATUS:DNR Diet recommendation: Heart Healthy  Brief Summary: This 86 years old male with PMH significant for peripheral arterial disease with chronic wound involving left fourth and fifth toe for which he is on antibiotic therapy, history of urinary retention with indwelling Foley catheter, diabetes mellitus and hypertension who was sent to emergency room from assisted living for the evaluation of hematuria. He was seen 2 days ago at the hospital for malfunctioning of his Foley catheter which was replaced and patient was discharged with outpatient urology follow-up.  CT scanning of abdomen pelvis showed hemorrhage within the bladder wall thickening and mucosal hyperenhancement suspicious for cystitis.  Left foot x-ray shows erosive changes in the distal aspect of fifth proximal phalanx consistent with osteomyelitis.  Patient also found to have a 1 g drop in hemoglobin.  Hospital course: Patient was admitted for gross hematuria and chronic left fifth toe osteomyelitis.  Urology was consulted,  recommended to continue CBI and outpatient follow-up with [facility] urology.  Hematuria has resolved,  CBI was discontinued.   Voiding trial completed, Patient has urinated.  Podiatry was consulted who recommended amputation of left  fifth toe given chronic osteomyelitis.  Patient is not willing to proceed with amputation.  Patient usually follows up with physicians at [facility]. Patient want to get second opinion from his [facility] vascular surgery before considering amputation.  Patient continues to feel better.  His hemoglobin has dropped to 7.5, Patient was given 1 unit of packed red blood cells before discharge.  Patient was continued on aspirin and Plavix due to recent vascular intervention in his lower extremities.  His Eliquis was on hold which will be resumed at discharge.  He was managed for below problems.  Discharge Diagnoses:  Principal Problem:   Gross hematuria Active Problems:   Diabetes mellitus without complication (HCC)   Hypertension   PAD (peripheral artery disease) (HCC)   Osteomyelitis, chronic, lower leg, left (HCC)   Hypothyroid   Lung nodule seen on imaging study  Gross hematuria could be secondary to hemorrhagic cystitis:>>> Resolved. Patient has indwelling Foley catheter for urinary retention for some time. He was seen 2 days ago for malfunctioning urinary catheter,  it was replaced and patient was discharged. Unknown etiology at this point,  could be traumatic catheterization. CT scan of abdomen and pelvis done with and without contrast shows possible hemorrhagic cystitis Patient was on Eliquis, aspirin and Plavix and is noted to have a 1 g drop in his H&H over the last 48 hours(9.3g/dl >> 8.1X/BJ) We will hold anticoagulant for the next 24 hours and repeat H&H. Urology consulted recommended to continue aspirin and Plavix given recent endovascular procedure. Consider voiding trial if urine clears in the morning. Urine culture : no growth so far. Continue CBI until urine is clear CBI discontinued after urine clears. Patient will follow up with [facility] urology after discharge.  Voiding trial successful.  Diabetes mellitus: Maintain consistent carbohydrate diet Glycemic control with  sliding scale insulin  CKD stage IIIa: Renal functions at baseline.   Avoid nephrotoxic medications.  BPH Continue Flomax and finasteride  Hypertension Continue diltiazem  Hypothyroidism Continue Synthroid  Lung nodule Incidental finding Noted in the posterior right middle lobe Repeat imaging has been recommended in the next 6 months if patient is deemed a therapy candidate   Chronic left fifth toe osteomyelitis Patient follows up at [facility] and from chart review: He was initially treated with IV antibiotics for osteomyelitis although then ID was consulted and the recommendation was to stop antibiotics, because the osteomyelitis appeared chronic and definitive treatment is surgical.  X-ray on 12/2 demonstrated osseous resorption in the proximal distal phalanx of the fifth toe suggestive of osteomyelitis. This is the same as the findings on the x-ray from12/23 Podiatry consulted recommended amputation of left fifth toe.  Patient declined at this point. Patient want to get second opinion from his vascular surgeon before considering amputation. Patient follows with physicians in [facility] system prefers to be seen in the same system. Podiatry signed off.  Discharge Instructions  Discharge Instructions    Call MD for:  persistant dizziness or light-headedness   Complete by: As directed    Call MD for:  persistant nausea and vomiting   Complete by: As directed    Call MD for:  redness, tenderness, or signs of infection (pain, swelling, redness, odor or green/yellow discharge around incision site)   Complete by: As directed    Call MD for:  temperature >100.4   Complete by: As directed    Diet - low sodium heart healthy   Complete by: As directed    Diet Carb Modified   Complete by: As directed    Discharge instructions   Complete by: As directed    Advised to follow-up with PCP Dr. Vedia Coffer on December 30,21. Advised to follow-up with urology at [facility] for hematuria. Advised  to follow-up with vascular surgeon at [facility]. Advised to follow-up with podiatry at [facility] for second opinion about chronic osteomyelitis of left fifth toe.   Discharge wound care:   Complete by: As directed    Advised dry dressing 3 times a week.   Increase activity slowly   Complete by: As directed      Allergies as of 09/13/2020      Reactions   Statins Palpitations      Medication List    STOP taking these medications   cefdinir 300 MG capsule Commonly known as: OMNICEF     TAKE these medications   acetaminophen 500 MG tablet Commonly known as: TYLENOL Take 500 mg by mouth every 8 (eight) hours as needed.   Aspirin Low Dose 81 MG EC tablet Generic drug: aspirin Take 81 mg by mouth daily.   Cholecalciferol 50 MCG (2000 UT) Tabs Take 1,000 mg by mouth daily.   clopidogrel 75 MG tablet Commonly known as: PLAVIX Take 75 mg by mouth daily.   cyanocobalamin 1000 MCG tablet Take 1,000 mcg by mouth daily.   diltiazem 180 MG 24 hr capsule Commonly known as: TIAZAC Take 180 mg by mouth daily.   Eliquis 5 MG Tabs tablet Generic drug: apixaban Take 5 mg by mouth 2 (two) times daily.   ezetimibe 10 MG tablet Commonly known as: ZETIA Take 10 mg by mouth daily.   FeroSul 325 (65 FE) MG tablet Generic drug: ferrous sulfate Take 1 tablet by mouth every other  day.   finasteride 5 MG tablet Commonly known as: PROSCAR Take 5 mg by mouth daily.   Januvia 50 MG tablet Generic drug: sitaGLIPtin Take 50 mg by mouth daily.   levothyroxine 125 MCG tablet Commonly known as: SYNTHROID Take 125 mcg by mouth daily before breakfast.   mineral oil-hydrophilic petrolatum ointment Apply 1 application topically daily. To feet after cleaning   multivitamin with minerals Tabs tablet Take 1 tablet by mouth daily.   tamsulosin 0.4 MG Caps capsule Commonly known as: FLOMAX Take 0.4 mg by mouth at bedtime.            Discharge Care Instructions  (From admission, onward)          Start     Ordered   09/13/20 0000  Discharge wound care:       Comments: Advised dry dressing 3 times a week.   09/13/20 1328          Follow-up Information    Zenaida Niece, MD Follow up in 2 day(s).   Specialty: Internal Medicine Contact information: [address] [place] [address] [phone]              Allergies  Allergen Reactions  . Statins Palpitations    Consultations:  Urology  Podiatry   Procedures/Studies: US Venous Img Lower Unilateral Left (DVT)  Result Date: 09/11/2020 CLINICAL DATA:  Swelling EXAM: LEFT LOWER EXTREMITY VENOUS DOPPLER ULTRASOUND TECHNIQUE: Gray-scale sonography with compression, as well as color and duplex ultrasound, were performed to evaluate the deep venous system(s) from the level of the common femoral vein through the popliteal and proximal calf veins. COMPARISON:  None. FINDINGS: VENOUS Normal compressibility of the common femoral, superficial femoral, and popliteal veins, as well as the visualized calf veins. Visualized portions of profunda femoral vein and great saphenous vein unremarkable. No filling defects to suggest DVT on grayscale or color Doppler imaging. Doppler waveforms show normal direction of venous flow, normal respiratory phasicity and response to augmentation. Limited views of the contralateral common femoral vein are unremarkable. OTHER None. Limitations: Technologist describes technically difficult study secondary to calf edema. IMPRESSION: No femoropopliteal DVT nor evidence of DVT within the visualized calf veins. If clinical symptoms are inconsistent or if there are persistent or worsening symptoms, further imaging (possibly involving the iliac veins) may be warranted. Electronically Signed   By: Corlis Leak M.D.   On: 09/11/2020 07:46   DG Foot Complete Left  Result Date: 09/08/2020 CLINICAL DATA:  Foot wound on the fifth toe for several months, initial encounter EXAM: LEFT FOOT - COMPLETE 3+ VIEW  COMPARISON:  None. FINDINGS: Soft tissue wound is not well appreciated on this exam. There are however erosive changes in the distal aspect of the fifth proximal phalanx consistent with osteomyelitis. No fracture is noted. Diffuse vascular calcifications are noted. No significant soft tissue swelling is seen. IMPRESSION: Erosive changes in the distal aspect of the fifth proximal phalanx consistent with osteomyelitis. Electronically Signed   By: Alcide Clever M.D.   On: 09/08/2020 08:52   CT HEMATURIA WORKUP  Result Date: 09/10/2020 CLINICAL DATA:  Hematuria and dysuria. EXAM: CT ABDOMEN AND PELVIS WITHOUT AND WITH CONTRAST TECHNIQUE: Multidetector CT imaging of the abdomen and pelvis was performed following the standard protocol before and following the bolus administration of intravenous contrast. CONTRAST:  OMNIPAQUE IOHEXOL 300 MG/ML  SOLN COMPARISON:  None. FINDINGS: Lower chest: Emphysema. Ground-glass nodule in the posterior right middle lobe of 2.4 cm on 04/05. Mild  cardiomegaly. Pacer. Coronary artery atherosclerosis. Tiny hiatal hernia. Trace left pleural fluid. Hepatobiliary: Focal steatosis adjacent the falciform ligament. Areas of hypoattenuation along the capsule of the right lobe of the liver measure less than 1 cm and may also represent focal steatosis, including on 31/7. Cholecystectomy, without biliary ductal dilatation. Pancreas: Normal, without mass or ductal dilatation. Spleen: Normal in size, without focal abnormality. Adrenals/Urinary Tract: Normal adrenal glands. Left renal vascular calcifications. No renal calculi or hydronephrosis. No hydroureter or ureteric calculi. No bladder calculi. Interpolar left renal 2.5 cm cyst. Other bilateral too small to characterize renal lesions. Moderate renal collecting system opacification on delayed images. Portions of both ureters are suboptimally opacified on delayed images. No collecting system or ureteric filling defect. Foley catheter within  the urinary bladder. Hyperattenuating material within consistent with hemorrhage on precontrast image 68/2. The bladder wall is mildly thickened and hyperenhancing after contrast. No well-defined bladder mass. No other filling defect on delayed images, somewhat limited by lack of bladder distension. Stomach/Bowel: Normal remainder of the stomach. Scattered colonic diverticula. Nonobstructive distal and terminal ileum position within the right inguinal canal on 72/7. periampullary duodenal diverticulum. Otherwise normal small bowel. Vascular/Lymphatic: Advanced aortic and branch vessel atherosclerosis. No abdominopelvic adenopathy. Reproductive: Moderate prostatomegaly. Other: No significant free fluid. Fat containing left inguinal hernia. Subcutaneous edema about the left proximal thigh, including on 103/7. Musculoskeletal: Advanced lumbosacral spondylosis with mild convex right lumbar spine curvature. IMPRESSION: 1. Hemorrhage within the bladder. Bladder wall thickening and mucosal hyperenhancement, suspicious for cystitis. A component of the wall thickening could be related to outlet obstruction in this patient with prostatomegaly. 2. No upper tract explanation for hematuria. 3. Posterior right middle lobe ground-glass nodule of 2.4 cm. Per consensus criteria, this warrants follow-up with chest CT at 6 months (if this 86 year old patient is a therapy candidate). This recommendation follows the consensus statement: Guidelines for Management of Incidental Pulmonary Nodules Detected on CT Images:From the Fleischner Society 2017; published online before print (10.1148/radiol.4034742595). 4. Aortic atherosclerosis (ICD10-I70.0), coronary artery atherosclerosis and emphysema (ICD10-J43.9). 5.  Tiny hiatal hernia. 6. Bilateral inguinal hernias. The right-sided inguinal hernia contains nonobstructive small bowel. 7. Nonspecific subcutaneous edema about the left proximal thigh. Electronically Signed   By: Jeronimo Greaves M.D.    On: 09/10/2020 14:19      Subjective: Patient was seen and examined at bedside.  Overnight events noted.  Patient denies any symptoms.   Patient has urinated after Foley catheter was removed.  Patient is getting blood transfusion.  And wants to be discharged back.  Discharge Exam: Vitals:   09/13/20 1201 09/13/20 1316  BP: (!) 106/43 (!) 150/55  Pulse: (!) 51 62  Resp: 18 18  Temp: (!) 97.5 F (36.4 C) 98.8 F (37.1 C)  SpO2: 97% 98%   Vitals:   09/13/20 0435 09/13/20 0824 09/13/20 1201 09/13/20 1316  BP: (!) 121/51 104/68 (!) 106/43 (!) 150/55  Pulse: (!) 109 (!) 55 (!) 51 62  Resp: 17 18 18 18   Temp: 97.9 F (36.6 C) (!) 97.5 F (36.4 C) (!) 97.5 F (36.4 C) 98.8 F (37.1 C)  TempSrc: Oral Oral Oral   SpO2: 100% 97% 97% 98%  Weight:      Height:        General: Pt is alert, awake, not in acute distress Cardiovascular: RRR, S1/S2 +, no rubs, no gallops Respiratory: CTA bilaterally, no wheezing, no rhonchi Abdominal: Soft, NT, ND, bowel sounds + Extremities: no edema, no cyanosis    The results  of significant diagnostics from this hospitalization (including imaging, microbiology, ancillary and laboratory) are listed below for reference.     Microbiology: Recent Results (from the past 240 hour(s))  Urine Culture     Status: Abnormal   Collection Time: 09/08/20 11:03 AM   Specimen: Urine, Random  Result Value Ref Range Status   Specimen Description   Final    URINE, RANDOM Performed at [facility], [address], [place], [place] [address]    Special Requests   Final    NONE Performed at [facility], [address] [place], [place] [address]    Culture (A)  Final    >=100,000 COLONIES/mL PANTOEA SPECIES >=100,000 COLONIES/mL PSEUDOMONAS PUTIDA    Report Status 09/11/2020 FINAL  Final   Organism ID, Bacteria PANTOEA SPECIES (A)  Final   Organism ID, Bacteria PSEUDOMONAS PUTIDA (A)  Final      Susceptibility   Pantoea species  - MIC*    CEFTAZIDIME <=1 SENSITIVE Sensitive     CIPROFLOXACIN <=0.25 SENSITIVE Sensitive     GENTAMICIN <=1 SENSITIVE Sensitive     IMIPENEM 0.5 SENSITIVE Sensitive     PIP/TAZO <=4 SENSITIVE Sensitive     CEFEPIME <=0.12 SENSITIVE Sensitive     * >=100,000 COLONIES/mL PANTOEA SPECIES   Pseudomonas putida - MIC*    CEFTAZIDIME 4 SENSITIVE Sensitive     CIPROFLOXACIN <=0.25 SENSITIVE Sensitive     GENTAMICIN <=1 SENSITIVE Sensitive     IMIPENEM 1 SENSITIVE Sensitive     PIP/TAZO 8 SENSITIVE Sensitive     * >=100,000 COLONIES/mL PSEUDOMONAS PUTIDA  Culture, blood (routine x 2)     Status: None   Collection Time: 09/08/20 11:47 AM   Specimen: BLOOD  Result Value Ref Range Status   Specimen Description BLOOD LEFT HAND  Final   Special Requests   Final    BOTTLES DRAWN AEROBIC AND ANAEROBIC Blood Culture adequate volume   Culture   Final    NO GROWTH 5 DAYS Performed at [facility], [address] [place], [place] [address]    Report Status 09/13/2020 FINAL  Final  Culture, blood (routine x 2)     Status: None   Collection Time: 09/08/20 11:47 AM   Specimen: BLOOD  Result Value Ref Range Status   Specimen Description BLOOD LEFT AC  Final   Special Requests   Final    BOTTLES DRAWN AEROBIC AND ANAEROBIC Blood Culture results may not be optimal due to an inadequate volume of blood received in culture bottles   Culture   Final    NO GROWTH 5 DAYS Performed at [facility], [address] [place], [place] [address]    Report Status 09/13/2020 FINAL  Final  Resp Panel by RT-PCR (Flu A&B, Covid) Nasopharyngeal Swab     Status: None   Collection Time: 09/08/20 11:47 AM   Specimen: Nasopharyngeal Swab; Nasopharyngeal(NP) swabs in vial transport medium  Result Value Ref Range Status   SARS Coronavirus 2 by RT PCR NEGATIVE NEGATIVE Final    Comment: (NOTE) SARS-CoV-2 target nucleic acids are NOT DETECTED.  The SARS-CoV-2 RNA is generally detectable in upper  respiratory specimens during the acute phase of infection. The lowest concentration of SARS-CoV-2 viral copies this assay can detect is 138 copies/mL. A negative result does not preclude SARS-Cov-2 infection and should not be used as the sole basis for treatment or other patient management decisions. A negative result may occur with  improper specimen collection/handling, submission  of specimen other than nasopharyngeal swab, presence of viral mutation(s) within the areas targeted by this assay, and inadequate number of viral copies(<138 copies/mL). A negative result must be combined with clinical observations, patient history, and epidemiological information. The expected result is Negative.  Fact Sheet for Patients:  BloggerCourse.com  Fact Sheet for Healthcare Providers:  SeriousBroker.it  This test is no t yet approved or cleared by the [place] FDA and  has been authorized for detection and/or diagnosis of SARS-CoV-2 by FDA under an Emergency Use Authorization (EUA). This EUA will remain  in effect (meaning this test can be used) for the duration of the COVID-19 declaration under Section 564(b)(1) of the Act, 21 U.S.C.section 360bbb-3(b)(1), unless the authorization is terminated  or revoked sooner.       Influenza A by PCR NEGATIVE NEGATIVE Final   Influenza B by PCR NEGATIVE NEGATIVE Final    Comment: (NOTE) The Xpert Xpress SARS-CoV-2/FLU/RSV plus assay is intended as an aid in the diagnosis of influenza from Nasopharyngeal swab specimens and should not be used as a sole basis for treatment. Nasal washings and aspirates are unacceptable for Xpert Xpress SARS-CoV-2/FLU/RSV testing.  Fact Sheet for Patients: BloggerCourse.com  Fact Sheet for Healthcare Providers: SeriousBroker.it  This test is not yet approved or cleared by the [place] FDA and has been  authorized for detection and/or diagnosis of SARS-CoV-2 by FDA under an Emergency Use Authorization (EUA). This EUA will remain in effect (meaning this test can be used) for the duration of the COVID-19 declaration under Section 564(b)(1) of the Act, 21 U.S.C. section 360bbb-3(b)(1), unless the authorization is terminated or revoked.  Performed at [facility], [address] [place], [place] [address]   Urine culture     Status: None   Collection Time: 09/10/20  9:40 AM   Specimen: Urine, Random  Result Value Ref Range Status   Specimen Description   Final    URINE, RANDOM Performed at [facility], [address] [place], [place] [address]    Special Requests   Final    NONE Performed at [facility], [address], [place], [place] [address]    Culture   Final    NO GROWTH Performed at [facility], [address] [place], [place] [address]    Report Status 09/11/2020 FINAL  Final  Resp Panel by RT-PCR (Flu A&B, Covid) Nasopharyngeal Swab     Status: None   Collection Time: 09/10/20  1:51 PM   Specimen: Nasopharyngeal Swab; Nasopharyngeal(NP) swabs in vial transport medium  Result Value Ref Range Status   SARS Coronavirus 2 by RT PCR NEGATIVE NEGATIVE Final    Comment: (NOTE) SARS-CoV-2 target nucleic acids are NOT DETECTED.  The SARS-CoV-2 RNA is generally detectable in upper respiratory specimens during the acute phase of infection. The lowest concentration of SARS-CoV-2 viral copies this assay can detect is 138 copies/mL. A negative result does not preclude SARS-Cov-2 infection and should not be used as the sole basis for treatment or other patient management decisions. A negative result may occur with  improper specimen collection/handling, submission of specimen other than nasopharyngeal swab, presence of viral mutation(s) within the areas targeted by this assay, and inadequate number of viral copies(<138 copies/mL). A  negative result must be combined with clinical observations, patient history, and epidemiological information. The expected result is Negative.  Fact Sheet for Patients:  BloggerCourse.com  Fact Sheet for Healthcare Providers:  SeriousBroker.it  This test is no t  yet approved or cleared by the [place] and  has been authorized for detection and/or diagnosis of SARS-CoV-2 by FDA under an Emergency Use Authorization (EUA). This EUA will remain  in effect (meaning this test can be used) for the duration of the COVID-19 declaration under Section 564(b)(1) of the Act, 21 U.S.C.section 360bbb-3(b)(1), unless the authorization is terminated  or revoked sooner.       Influenza A by PCR NEGATIVE NEGATIVE Final   Influenza B by PCR NEGATIVE NEGATIVE Final    Comment: (NOTE) The Xpert Xpress SARS-CoV-2/FLU/RSV plus assay is intended as an aid in the diagnosis of influenza from Nasopharyngeal swab specimens and should not be used as a sole basis for treatment. Nasal washings and aspirates are unacceptable for Xpert Xpress SARS-CoV-2/FLU/RSV testing.  Fact Sheet for Patients: BloggerCourse.com  Fact Sheet for Healthcare Providers: SeriousBroker.it  This test is not yet approved or cleared by the [place] FDA and has been authorized for detection and/or diagnosis of SARS-CoV-2 by FDA under an Emergency Use Authorization (EUA). This EUA will remain in effect (meaning this test can be used) for the duration of the COVID-19 declaration under Section 564(b)(1) of the Act, 21 U.S.C. section 360bbb-3(b)(1), unless the authorization is terminated or revoked.  Performed at [facility], [address] [place], [place] [address]   MRSA PCR Screening     Status: None   Collection Time: 09/10/20  6:32 PM   Specimen: Nasopharyngeal  Result Value Ref Range Status   MRSA by  PCR NEGATIVE NEGATIVE Final    Comment:        The GeneXpert MRSA Assay (FDA approved for NASAL specimens only), is one component of a comprehensive MRSA colonization surveillance program. It is not intended to diagnose MRSA infection nor to guide or monitor treatment for MRSA infections. Performed at [facility], [address] [place], [place] [address]      Labs: BNP (last 3 results) No results for input(s): BNP in the last 8760 hours. Basic Metabolic Panel: Recent Labs  Lab 09/08/20 1103 09/10/20 0847 09/11/20 0605 09/13/20 0507  NA 138 135 136 137  K 4.5 4.3 4.3 3.8  CL 103 102 103 105  CO2 26 25 27 25   GLUCOSE 180* 171* 116* 154*  BUN 21 33* 23 26*  CREATININE 0.98 1.07 0.91 0.94  CALCIUM 9.2 8.7* 8.4* 8.5*   Liver Function Tests: Recent Labs  Lab 09/08/20 1103 09/10/20 0847  AST 20 18  ALT 17 13  ALKPHOS 73 66  BILITOT 0.5 0.5  PROT 6.8 6.4*  ALBUMIN 3.5 3.1*   No results for input(s): LIPASE, AMYLASE in the last 168 hours. No results for input(s): AMMONIA in the last 168 hours. CBC: Recent Labs  Lab 09/08/20 1103 09/10/20 0847 09/11/20 0605 09/11/20 1436 09/13/20 0507  WBC 10.9* 9.5 7.3  --   --   NEUTROABS 9.2* 8.0*  --   --   --   HGB 9.3* 8.3* 7.9* 7.7* 7.5*  HCT 30.0* 26.0* 24.5* 24.8* 23.4*  MCV 89.6 88.4 88.1  --   --   PLT 272 230 223  --   --    Cardiac Enzymes: No results for input(s): CKTOTAL, CKMB, CKMBINDEX, TROPONINI in the last 168 hours. BNP: Invalid input(s): POCBNP CBG: Recent Labs  Lab 09/12/20 1209 09/12/20 1552 09/12/20 2156 09/13/20 0737 09/13/20 1157  GLUCAP 276* 153* 123* 147* 215*   D-Dimer No results for input(s): DDIMER in the last 72  hours. Hgb A1c Recent Labs    09/10/20 1645  HGBA1C 7.6*   Lipid Profile No results for input(s): CHOL, HDL, LDLCALC, TRIG, CHOLHDL, LDLDIRECT in the last 72 hours. Thyroid function studies No results for input(s): TSH, T4TOTAL, T3FREE, THYROIDAB in the  last 72 hours.  Invalid input(s): FREET3 Anemia work up No results for input(s): VITAMINB12, FOLATE, FERRITIN, TIBC, IRON, RETICCTPCT in the last 72 hours. Urinalysis    Component Value Date/Time   COLORURINE YELLOW (A) 09/10/2020 0940   APPEARANCEUR HAZY (A) 09/10/2020 0940   LABSPEC 1.016 09/10/2020 0940   PHURINE 5.0 09/10/2020 0940   GLUCOSEU 50 (A) 09/10/2020 0940   HGBUR LARGE (A) 09/10/2020 0940   BILIRUBINUR NEGATIVE 09/10/2020 0940   KETONESUR NEGATIVE 09/10/2020 0940   PROTEINUR 100 (A) 09/10/2020 0940   NITRITE NEGATIVE 09/10/2020 0940   LEUKOCYTESUR NEGATIVE 09/10/2020 0940   Sepsis Labs Invalid input(s): PROCALCITONIN,  WBC,  LACTICIDVEN Microbiology Recent Results (from the past 240 hour(s))  Urine Culture     Status: Abnormal   Collection Time: 09/08/20 11:03 AM   Specimen: Urine, Random  Result Value Ref Range Status   Specimen Description   Final    URINE, RANDOM Performed at [facility], [address] [place], [place] [address]    Special Requests   Final    NONE Performed at [facility], [address] [place], [place] [address]    Culture (A)  Final    >=100,000 COLONIES/mL PANTOEA SPECIES >=100,000 COLONIES/mL PSEUDOMONAS PUTIDA    Report Status 09/11/2020 FINAL  Final   Organism ID, Bacteria PANTOEA SPECIES (A)  Final   Organism ID, Bacteria PSEUDOMONAS PUTIDA (A)  Final      Susceptibility   Pantoea species - MIC*    CEFTAZIDIME <=1 SENSITIVE Sensitive     CIPROFLOXACIN <=0.25 SENSITIVE Sensitive     GENTAMICIN <=1 SENSITIVE Sensitive     IMIPENEM 0.5 SENSITIVE Sensitive     PIP/TAZO <=4 SENSITIVE Sensitive     CEFEPIME <=0.12 SENSITIVE Sensitive     * >=100,000 COLONIES/mL PANTOEA SPECIES   Pseudomonas putida - MIC*    CEFTAZIDIME 4 SENSITIVE Sensitive     CIPROFLOXACIN <=0.25 SENSITIVE Sensitive     GENTAMICIN <=1 SENSITIVE Sensitive     IMIPENEM 1 SENSITIVE Sensitive     PIP/TAZO 8 SENSITIVE Sensitive     *  >=100,000 COLONIES/mL PSEUDOMONAS PUTIDA  Culture, blood (routine x 2)     Status: None   Collection Time: 09/08/20 11:47 AM   Specimen: BLOOD  Result Value Ref Range Status   Specimen Description BLOOD LEFT HAND  Final   Special Requests   Final    BOTTLES DRAWN AEROBIC AND ANAEROBIC Blood Culture adequate volume   Culture   Final    NO GROWTH 5 DAYS Performed at [facility], [address] [place], [place] [address]    Report Status 09/13/2020 FINAL  Final  Culture, blood (routine x 2)     Status: None   Collection Time: 09/08/20 11:47 AM   Specimen: BLOOD  Result Value Ref Range Status   Specimen Description BLOOD LEFT AC  Final   Special Requests   Final    BOTTLES DRAWN AEROBIC AND ANAEROBIC Blood Culture results may not be optimal due to an inadequate volume of blood received in culture bottles   Culture   Final    NO GROWTH 5 DAYS Performed at [facility], [address] [place], [place] [address]  Report Status 09/13/2020 FINAL  Final  Resp Panel by RT-PCR (Flu A&B, Covid) Nasopharyngeal Swab     Status: None   Collection Time: 09/08/20 11:47 AM   Specimen: Nasopharyngeal Swab; Nasopharyngeal(NP) swabs in vial transport medium  Result Value Ref Range Status   SARS Coronavirus 2 by RT PCR NEGATIVE NEGATIVE Final    Comment: (NOTE) SARS-CoV-2 target nucleic acids are NOT DETECTED.  The SARS-CoV-2 RNA is generally detectable in upper respiratory specimens during the acute phase of infection. The lowest concentration of SARS-CoV-2 viral copies this assay can detect is 138 copies/mL. A negative result does not preclude SARS-Cov-2 infection and should not be used as the sole basis for treatment or other patient management decisions. A negative result may occur with  improper specimen collection/handling, submission of specimen other than nasopharyngeal swab, presence of viral mutation(s) within the areas targeted by this assay, and inadequate  number of viral copies(<138 copies/mL). A negative result must be combined with clinical observations, patient history, and epidemiological information. The expected result is Negative.  Fact Sheet for Patients:  BloggerCourse.com  Fact Sheet for Healthcare Providers:  SeriousBroker.it  This test is no t yet approved or cleared by the [place] FDA and  has been authorized for detection and/or diagnosis of SARS-CoV-2 by FDA under an Emergency Use Authorization (EUA). This EUA will remain  in effect (meaning this test can be used) for the duration of the COVID-19 declaration under Section 564(b)(1) of the Act, 21 U.S.C.section 360bbb-3(b)(1), unless the authorization is terminated  or revoked sooner.       Influenza A by PCR NEGATIVE NEGATIVE Final   Influenza B by PCR NEGATIVE NEGATIVE Final    Comment: (NOTE) The Xpert Xpress SARS-CoV-2/FLU/RSV plus assay is intended as an aid in the diagnosis of influenza from Nasopharyngeal swab specimens and should not be used as a sole basis for treatment. Nasal washings and aspirates are unacceptable for Xpert Xpress SARS-CoV-2/FLU/RSV testing.  Fact Sheet for Patients: BloggerCourse.com  Fact Sheet for Healthcare Providers: SeriousBroker.it  This test is not yet approved or cleared by the [place] FDA and has been authorized for detection and/or diagnosis of SARS-CoV-2 by FDA under an Emergency Use Authorization (EUA). This EUA will remain in effect (meaning this test can be used) for the duration of the COVID-19 declaration under Section 564(b)(1) of the Act, 21 U.S.C. section 360bbb-3(b)(1), unless the authorization is terminated or revoked.  Performed at [facility], [address], [place], [place] [address]   Urine culture     Status: None   Collection Time: 09/10/20  9:40 AM   Specimen: Urine, Random   Result Value Ref Range Status   Specimen Description   Final    URINE, RANDOM Performed at [facility], [address] [place], [place] [address]    Special Requests   Final    NONE Performed at [facility], [address], [place], [place] [address]    Culture   Final    NO GROWTH Performed at [facility], [address] [place], [place] [address]    Report Status 09/11/2020 FINAL  Final  Resp Panel by RT-PCR (Flu A&B, Covid) Nasopharyngeal Swab     Status: None   Collection Time: 09/10/20  1:51 PM   Specimen: Nasopharyngeal Swab; Nasopharyngeal(NP) swabs in vial transport medium  Result Value Ref Range Status   SARS Coronavirus 2 by RT PCR NEGATIVE NEGATIVE Final    Comment: (NOTE) SARS-CoV-2 target  nucleic acids are NOT DETECTED.  The SARS-CoV-2 RNA is generally detectable in upper respiratory specimens during the acute phase of infection. The lowest concentration of SARS-CoV-2 viral copies this assay can detect is 138 copies/mL. A negative result does not preclude SARS-Cov-2 infection and should not be used as the sole basis for treatment or other patient management decisions. A negative result may occur with  improper specimen collection/handling, submission of specimen other than nasopharyngeal swab, presence of viral mutation(s) within the areas targeted by this assay, and inadequate number of viral copies(<138 copies/mL). A negative result must be combined with clinical observations, patient history, and epidemiological information. The expected result is Negative.  Fact Sheet for Patients:  BloggerCourse.com  Fact Sheet for Healthcare Providers:  SeriousBroker.it  This test is no t yet approved or cleared by the [place] FDA and  has been authorized for detection and/or diagnosis of SARS-CoV-2 by FDA under an Emergency Use Authorization (EUA). This EUA will remain  in effect  (meaning this test can be used) for the duration of the COVID-19 declaration under Section 564(b)(1) of the Act, 21 U.S.C.section 360bbb-3(b)(1), unless the authorization is terminated  or revoked sooner.       Influenza A by PCR NEGATIVE NEGATIVE Final   Influenza B by PCR NEGATIVE NEGATIVE Final    Comment: (NOTE) The Xpert Xpress SARS-CoV-2/FLU/RSV plus assay is intended as an aid in the diagnosis of influenza from Nasopharyngeal swab specimens and should not be used as a sole basis for treatment. Nasal washings and aspirates are unacceptable for Xpert Xpress SARS-CoV-2/FLU/RSV testing.  Fact Sheet for Patients: BloggerCourse.com  Fact Sheet for Healthcare Providers: SeriousBroker.it  This test is not yet approved or cleared by the [place] FDA and has been authorized for detection and/or diagnosis of SARS-CoV-2 by FDA under an Emergency Use Authorization (EUA). This EUA will remain in effect (meaning this test can be used) for the duration of the COVID-19 declaration under Section 564(b)(1) of the Act, 21 U.S.C. section 360bbb-3(b)(1), unless the authorization is terminated or revoked.  Performed at [facility], [address] [place], [place] [address]   MRSA PCR Screening     Status: None   Collection Time: 09/10/20  6:32 PM   Specimen: Nasopharyngeal  Result Value Ref Range Status   MRSA by PCR NEGATIVE NEGATIVE Final    Comment:        The GeneXpert MRSA Assay (FDA approved for NASAL specimens only), is one component of a comprehensive MRSA colonization surveillance program. It is not intended to diagnose MRSA infection nor to guide or monitor treatment for MRSA infections. Performed at [facility], [address], [place], [place] [address]      Time coordinating discharge: Over 30 minutes  SIGNED:   Cipriano Bunker, MD  [facility] 09/13/2020, 1:31 PM Pager   If  7PM-7AM, please contact night-coverage [email or web address]

## 2020-09-13 NOTE — TOC Transition Note (Addendum)
Transition of Care [facility]) - CM/SW Discharge Note   Patient Details  Name: [name] [name] MRN: [id number] Date of Birth: 1928-07-10  Transition of Care [facility] CM/SW Contact:  Chapman Fitch, RN Phone Number: 09/13/2020, 5:17 PM   Clinical Narrative:    Suzette Battiest Nurse  at [facility] has confirmed that patient can return today Patient confirms these are his wishes  Suzette Battiest confirmed receipt of fl2 and dc summary  Facility unable to provide transportation at discharge  Transport was arrange through [employer] transport was set for 6pm.  Bedside and RN aware Patient A&Ox4 I have left his son and daughter both a message to update plan  Resumption orders for home health RN, PT, OT faxed to Sandy at [facility] and notified of discharge        Final next level of care: Assisted Living Barriers to Discharge: No Barriers Identified   Patient Goals and CMS Choice        Discharge Placement                Patient to be transferred to facility by: [facility]      Discharge Plan and Services                          HH Arranged: PT,OT,RN [facility] Agency: Other - See comment Date HH Agency Contacted: 09/13/20   Representative spoke with at [facility] Agency: [facility]  Social Determinants of Health (SDOH) Interventions     Readmission Risk Interventions No flowsheet data found.

## 2020-09-13 NOTE — [place] FL2 (Signed)
[place] MEDICAID FL2 LEVEL OF CARE SCREENING TOOL     IDENTIFICATION  Patient Name: [name] [name] Birthdate: May 12, 1928 Sex: male Admission Date (Current Location): 09/10/2020  [facility] and [place] Number:  Chiropodist and Address:         Provider Number: [id number]  Attending Physician Name and Address:  Cipriano Bunker, MD  Relative Name and Phone Number:       Current Level of Care: Hospital Recommended Level of Care: Assisted Living Facility Prior Approval Number:    Date Approved/Denied:   PASRR Number:    Discharge Plan: Other (Comment) (ALF)    Current Diagnoses: Patient Active Problem List   Diagnosis Date Noted  . Gross hematuria 09/10/2020  . Diabetes mellitus without complication (HCC)   . Hypertension   . PAD (peripheral artery disease) (HCC)   . Osteomyelitis, chronic, lower leg, left (HCC)   . Hypothyroid   . Lung nodule seen on imaging study     Orientation RESPIRATION BLADDER Height & Weight     Self,Time,Situation,Place  Normal External catheter,Incontinent Weight: 74.8 kg Height:  5\' 11"  (180.3 cm)  BEHAVIORAL SYMPTOMS/MOOD NEUROLOGICAL BOWEL NUTRITION STATUS      Continent Diet (Carb modified)  AMBULATORY STATUS COMMUNICATION OF NEEDS Skin   Limited Assist Verbally Other (Comment) (Apply mupirocin ointment to the left fourth toe nailbed and fifth toe ulceration and cover with gauze 3 times a week)                       Personal Care Assistance Level of Assistance              Functional Limitations Info             SPECIAL CARE FACTORS FREQUENCY  PT (By licensed PT)                    Contractures Contractures Info: Not present    Additional Factors Info  Code Status,Allergies Code Status Info: DNR Allergies Info: Statins           STOP taking these medications       cefdinir 300 MG capsule Commonly known as: OMNICEF             TAKE these medications       acetaminophen 500 MG  tablet Commonly known as: TYLENOL Take 500 mg by mouth every 8 (eight) hours as needed.   Aspirin Low Dose 81 MG EC tablet Generic drug: aspirin Take 81 mg by mouth daily.   Cholecalciferol 50 MCG (2000 UT) Tabs Take 1,000 mg by mouth daily.   clopidogrel 75 MG tablet Commonly known as: PLAVIX Take 75 mg by mouth daily.   cyanocobalamin 1000 MCG tablet Take 1,000 mcg by mouth daily.   diltiazem 180 MG 24 hr capsule Commonly known as: TIAZAC Take 180 mg by mouth daily.   Eliquis 5 MG Tabs tablet Generic drug: apixaban Take 5 mg by mouth 2 (two) times daily.   ezetimibe 10 MG tablet Commonly known as: ZETIA Take 10 mg by mouth daily.   FeroSul 325 (65 FE) MG tablet Generic drug: ferrous sulfate Take 1 tablet by mouth every other day.   finasteride 5 MG tablet Commonly known as: PROSCAR Take 5 mg by mouth daily.   Januvia 50 MG tablet Generic drug: sitaGLIPtin Take 50 mg by mouth daily.   levothyroxine 125 MCG tablet Commonly known as: SYNTHROID Take 125 mcg by mouth daily before breakfast.  mineral oil-hydrophilic petrolatum ointment Apply 1 application topically daily. To feet after cleaning   multivitamin with minerals Tabs tablet Take 1 tablet by mouth daily.   tamsulosin 0.4 MG Caps capsule Commonly known as: FLOMAX Take 0.4 mg by mouth at bedtime.    Relevant Imaging Results:  Relevant Lab Results:   Additional Information    Chapman Fitch, RN

## 2020-09-14 LAB — BPAM RBC
Blood Product Expiration Date: 202201012359
ISSUE DATE / TIME: 202112281324
Unit Type and Rh: 5100

## 2020-09-14 LAB — TYPE AND SCREEN
ABO/RH(D): O POS
Antibody Screen: NEGATIVE
Unit division: 0

## 2021-08-18 ENCOUNTER — Other Ambulatory Visit: Payer: Self-pay

## 2021-08-18 ENCOUNTER — Emergency Department: Payer: Medicare Other

## 2021-08-18 ENCOUNTER — Inpatient Hospital Stay
Admission: EM | Admit: 2021-08-18 | Discharge: 2021-08-24 | DRG: 177 | Disposition: A | Discharge status: Skilled Nursing Facility | Payer: Medicare Other | Source: Skilled Nursing Facility | Attending: Hospitalist | Admitting: Hospitalist

## 2021-08-18 ENCOUNTER — Inpatient Hospital Stay: Payer: Medicare Other

## 2021-08-18 ENCOUNTER — Encounter: Payer: Self-pay | Admitting: Emergency Medicine

## 2021-08-18 DIAGNOSIS — N400 Enlarged prostate without lower urinary tract symptoms: Secondary | ICD-10-CM | POA: Diagnosis present

## 2021-08-18 DIAGNOSIS — R569 Unspecified convulsions: Secondary | ICD-10-CM | POA: Diagnosis present

## 2021-08-18 DIAGNOSIS — J9601 Acute respiratory failure with hypoxia: Secondary | ICD-10-CM | POA: Diagnosis present

## 2021-08-18 DIAGNOSIS — R579 Shock, unspecified: Secondary | ICD-10-CM | POA: Diagnosis present

## 2021-08-18 DIAGNOSIS — E1165 Type 2 diabetes mellitus with hyperglycemia: Secondary | ICD-10-CM | POA: Diagnosis present

## 2021-08-18 DIAGNOSIS — R319 Hematuria, unspecified: Secondary | ICD-10-CM

## 2021-08-18 DIAGNOSIS — D509 Iron deficiency anemia, unspecified: Secondary | ICD-10-CM | POA: Diagnosis present

## 2021-08-18 DIAGNOSIS — D649 Anemia, unspecified: Secondary | ICD-10-CM

## 2021-08-18 DIAGNOSIS — F0390 Unspecified dementia without behavioral disturbance: Secondary | ICD-10-CM | POA: Diagnosis present

## 2021-08-18 DIAGNOSIS — I10 Essential (primary) hypertension: Secondary | ICD-10-CM | POA: Diagnosis present

## 2021-08-18 DIAGNOSIS — R7401 Elevation of levels of liver transaminase levels: Secondary | ICD-10-CM | POA: Diagnosis present

## 2021-08-18 DIAGNOSIS — I495 Sick sinus syndrome: Secondary | ICD-10-CM | POA: Diagnosis present

## 2021-08-18 DIAGNOSIS — R55 Syncope and collapse: Secondary | ICD-10-CM | POA: Diagnosis present

## 2021-08-18 DIAGNOSIS — E872 Acidosis, unspecified: Secondary | ICD-10-CM | POA: Diagnosis present

## 2021-08-18 DIAGNOSIS — E875 Hyperkalemia: Secondary | ICD-10-CM | POA: Diagnosis present

## 2021-08-18 DIAGNOSIS — N170 Acute kidney failure with tubular necrosis: Secondary | ICD-10-CM | POA: Diagnosis present

## 2021-08-18 DIAGNOSIS — J690 Pneumonitis due to inhalation of food and vomit: Secondary | ICD-10-CM | POA: Diagnosis present

## 2021-08-18 DIAGNOSIS — H9190 Unspecified hearing loss, unspecified ear: Secondary | ICD-10-CM | POA: Diagnosis present

## 2021-08-18 DIAGNOSIS — E039 Hypothyroidism, unspecified: Secondary | ICD-10-CM | POA: Diagnosis present

## 2021-08-18 DIAGNOSIS — R578 Other shock: Secondary | ICD-10-CM | POA: Diagnosis present

## 2021-08-18 DIAGNOSIS — G253 Myoclonus: Secondary | ICD-10-CM | POA: Diagnosis present

## 2021-08-18 DIAGNOSIS — R6521 Severe sepsis with septic shock: Secondary | ICD-10-CM | POA: Diagnosis not present

## 2021-08-18 DIAGNOSIS — Z888 Allergy status to other drugs, medicaments and biological substances status: Secondary | ICD-10-CM

## 2021-08-18 DIAGNOSIS — T45515A Adverse effect of anticoagulants, initial encounter: Secondary | ICD-10-CM | POA: Diagnosis present

## 2021-08-18 DIAGNOSIS — Z7984 Long term (current) use of oral hypoglycemic drugs: Secondary | ICD-10-CM

## 2021-08-18 DIAGNOSIS — E1140 Type 2 diabetes mellitus with diabetic neuropathy, unspecified: Secondary | ICD-10-CM | POA: Diagnosis present

## 2021-08-18 DIAGNOSIS — R195 Other fecal abnormalities: Secondary | ICD-10-CM

## 2021-08-18 DIAGNOSIS — D62 Acute posthemorrhagic anemia: Secondary | ICD-10-CM | POA: Diagnosis present

## 2021-08-18 DIAGNOSIS — I482 Chronic atrial fibrillation, unspecified: Secondary | ICD-10-CM | POA: Diagnosis present

## 2021-08-18 DIAGNOSIS — Z20822 Contact with and (suspected) exposure to covid-19: Secondary | ICD-10-CM | POA: Diagnosis present

## 2021-08-18 DIAGNOSIS — Z950 Presence of cardiac pacemaker: Secondary | ICD-10-CM

## 2021-08-18 DIAGNOSIS — Z79899 Other long term (current) drug therapy: Secondary | ICD-10-CM

## 2021-08-18 DIAGNOSIS — E861 Hypovolemia: Secondary | ICD-10-CM | POA: Diagnosis present

## 2021-08-18 DIAGNOSIS — Z66 Do not resuscitate: Secondary | ICD-10-CM | POA: Diagnosis present

## 2021-08-18 DIAGNOSIS — E1151 Type 2 diabetes mellitus with diabetic peripheral angiopathy without gangrene: Secondary | ICD-10-CM | POA: Diagnosis present

## 2021-08-18 DIAGNOSIS — E785 Hyperlipidemia, unspecified: Secondary | ICD-10-CM | POA: Diagnosis present

## 2021-08-18 DIAGNOSIS — K922 Gastrointestinal hemorrhage, unspecified: Secondary | ICD-10-CM | POA: Diagnosis present

## 2021-08-18 DIAGNOSIS — Z7901 Long term (current) use of anticoagulants: Secondary | ICD-10-CM

## 2021-08-18 DIAGNOSIS — Z79890 Hormone replacement therapy (postmenopausal): Secondary | ICD-10-CM

## 2021-08-18 DIAGNOSIS — A419 Sepsis, unspecified organism: Secondary | ICD-10-CM | POA: Diagnosis not present

## 2021-08-18 DIAGNOSIS — I4891 Unspecified atrial fibrillation: Secondary | ICD-10-CM

## 2021-08-18 DIAGNOSIS — Z87891 Personal history of nicotine dependence: Secondary | ICD-10-CM

## 2021-08-18 LAB — CBC WITH DIFFERENTIAL/PLATELET
Abs Immature Granulocytes: 0.07 10*3/uL (ref 0.00–0.07)
Basophils Absolute: 0 10*3/uL (ref 0.0–0.1)
Basophils Relative: 0 %
Eosinophils Absolute: 0 10*3/uL (ref 0.0–0.5)
Eosinophils Relative: 0 %
HCT: 18.9 % — ABNORMAL LOW (ref 39.0–52.0)
Hemoglobin: 5.6 g/dL — ABNORMAL LOW (ref 13.0–17.0)
Immature Granulocytes: 1 %
Lymphocytes Relative: 4 %
Lymphs Abs: 0.5 10*3/uL — ABNORMAL LOW (ref 0.7–4.0)
MCH: 21.7 pg — ABNORMAL LOW (ref 26.0–34.0)
MCHC: 29.6 g/dL — ABNORMAL LOW (ref 30.0–36.0)
MCV: 73.3 fL — ABNORMAL LOW (ref 80.0–100.0)
Monocytes Absolute: 1.2 10*3/uL — ABNORMAL HIGH (ref 0.1–1.0)
Monocytes Relative: 9 %
Neutro Abs: 10.8 10*3/uL — ABNORMAL HIGH (ref 1.7–7.7)
Neutrophils Relative %: 86 %
Platelets: 200 10*3/uL (ref 150–400)
RBC: 2.58 MIL/uL — ABNORMAL LOW (ref 4.22–5.81)
RDW: 16.7 % — ABNORMAL HIGH (ref 11.5–15.5)
WBC: 12.5 10*3/uL — ABNORMAL HIGH (ref 4.0–10.5)
nRBC: 0.2 % (ref 0.0–0.2)

## 2021-08-18 LAB — COMPREHENSIVE METABOLIC PANEL
ALT: 46 U/L — ABNORMAL HIGH (ref 0–44)
AST: 43 U/L — ABNORMAL HIGH (ref 15–41)
Albumin: 3.3 g/dL — ABNORMAL LOW (ref 3.5–5.0)
Alkaline Phosphatase: 81 U/L (ref 38–126)
Anion gap: 8 (ref 5–15)
BUN: 62 mg/dL — ABNORMAL HIGH (ref 8–23)
CO2: 23 mmol/L (ref 22–32)
Calcium: 8.9 mg/dL (ref 8.9–10.3)
Chloride: 103 mmol/L (ref 98–111)
Creatinine, Ser: 2.23 mg/dL — ABNORMAL HIGH (ref 0.61–1.24)
GFR, Estimated: 27 mL/min — ABNORMAL LOW (ref >60)
Glucose, Bld: 262 mg/dL — ABNORMAL HIGH (ref 70–99)
Potassium: 5.5 mmol/L — ABNORMAL HIGH (ref 3.5–5.1)
Sodium: 134 mmol/L — ABNORMAL LOW (ref 135–145)
Total Bilirubin: 0.9 mg/dL (ref 0.3–1.2)
Total Protein: 6.2 g/dL — ABNORMAL LOW (ref 6.5–8.1)

## 2021-08-18 LAB — RESP PANEL BY RT-PCR (FLU A&B, COVID) ARPGX2
Influenza A by PCR: NEGATIVE
Influenza B by PCR: NEGATIVE
SARS Coronavirus 2 by RT PCR: NEGATIVE

## 2021-08-18 LAB — PREPARE RBC (CROSSMATCH)

## 2021-08-18 LAB — LACTIC ACID, PLASMA
Lactic Acid, Venous: 2.4 mmol/L (ref 0.5–1.9)
Lactic Acid, Venous: 3.1 mmol/L (ref 0.5–1.9)
Lactic Acid, Venous: 7.7 mmol/L (ref 0.5–1.9)

## 2021-08-18 LAB — PROCALCITONIN: Procalcitonin: <0.1 ng/mL

## 2021-08-18 LAB — PROTIME-INR
INR: 2.4 — ABNORMAL HIGH (ref 0.8–1.2)
Prothrombin Time: 25.9 seconds — ABNORMAL HIGH (ref 11.4–15.2)

## 2021-08-18 LAB — APTT: aPTT: 42 seconds — ABNORMAL HIGH (ref 24–36)

## 2021-08-18 LAB — MRSA NEXT GEN BY PCR, NASAL: MRSA by PCR Next Gen: NOT DETECTED

## 2021-08-18 MED ORDER — SODIUM CHLORIDE 0.9 % IV BOLUS (SEPSIS)
1000.0000 mL | Freq: Once | INTRAVENOUS | Status: AC
  Administered 2021-08-18: 1000 mL via INTRAVENOUS

## 2021-08-18 MED ORDER — LEVOTHYROXINE SODIUM 100 MCG/5ML IV SOLN: 62.0000 ug | Freq: Every day | INTRAVENOUS | Status: DC

## 2021-08-18 MED ORDER — SODIUM CHLORIDE 0.9 % IV SOLN
2.0000 g | Freq: Once | INTRAVENOUS | Status: AC
  Administered 2021-08-18: 2 g via INTRAVENOUS
  Filled 2021-08-18: qty 2

## 2021-08-18 MED ORDER — POLYETHYLENE GLYCOL 3350 17 G PO PACK: 17.0000 g | PACK | Freq: Every day | ORAL | Status: DC | PRN

## 2021-08-18 MED ORDER — DEXTROSE 50 % IV SOLN
1.0000 | Freq: Once | INTRAVENOUS | Status: AC
  Administered 2021-08-18: 50 mL via INTRAVENOUS
  Filled 2021-08-18: qty 50

## 2021-08-18 MED ORDER — ASPIRIN 81 MG PO CHEW: 324.0000 mg | CHEWABLE_TABLET | ORAL | Status: DC

## 2021-08-18 MED ORDER — PHENYLEPHRINE HCL-NACL 20-0.9 MG/250ML-% IV SOLN
0.0000 ug/min | INTRAVENOUS | Status: DC
  Administered 2021-08-18: 20 ug/min via INTRAVENOUS
  Administered 2021-08-19: 40 ug/min via INTRAVENOUS
  Filled 2021-08-18 (×2): qty 250

## 2021-08-18 MED ORDER — MORPHINE SULFATE (PF) 2 MG/ML IV SOLN
1.0000 mg | INTRAVENOUS | Status: DC | PRN
  Administered 2021-08-18 (×2): 1 mg via INTRAVENOUS
  Filled 2021-08-18 (×2): qty 1

## 2021-08-18 MED ORDER — SODIUM CHLORIDE 0.9 % IV BOLUS (SEPSIS)
500.0000 mL | Freq: Once | INTRAVENOUS | Status: AC
  Administered 2021-08-18: 500 mL via INTRAVENOUS

## 2021-08-18 MED ORDER — VANCOMYCIN HCL 1500 MG/300ML IV SOLN
1500.0000 mg | Freq: Once | INTRAVENOUS | Status: AC
  Administered 2021-08-18: 1500 mg via INTRAVENOUS
  Filled 2021-08-18: qty 300

## 2021-08-18 MED ORDER — ASPIRIN 300 MG RE SUPP: 300.0000 mg | RECTAL | Status: DC

## 2021-08-18 MED ORDER — LEVETIRACETAM IN NACL 500 MG/100ML IV SOLN
500.0000 mg | Freq: Two times a day (BID) | INTRAVENOUS | Status: DC
  Administered 2021-08-19: 500 mg via INTRAVENOUS
  Filled 2021-08-18 (×2): qty 100

## 2021-08-18 MED ORDER — PIPERACILLIN-TAZOBACTAM 3.375 G IVPB
3.3750 g | Freq: Three times a day (TID) | INTRAVENOUS | Status: DC
  Administered 2021-08-18 – 2021-08-21 (×10): 3.375 g via INTRAVENOUS
  Filled 2021-08-18 (×11): qty 50

## 2021-08-18 MED ORDER — SODIUM CHLORIDE 0.9 % IV SOLN
250.0000 mL | INTRAVENOUS | Status: DC
  Administered 2021-08-18 – 2021-08-21 (×3): 250 mL via INTRAVENOUS

## 2021-08-18 MED ORDER — LEVETIRACETAM IN NACL 1500 MG/100ML IV SOLN
20.0000 mg/kg | Freq: Once | INTRAVENOUS | Status: AC
  Administered 2021-08-18: 1500 mg via INTRAVENOUS
  Filled 2021-08-18 (×2): qty 100

## 2021-08-18 MED ORDER — NOREPINEPHRINE 4 MG/250ML-% IV SOLN
2.0000 ug/min | INTRAVENOUS | Status: DC
  Administered 2021-08-18: 5 ug/min via INTRAVENOUS
  Filled 2021-08-18: qty 250

## 2021-08-18 MED ORDER — PANTOPRAZOLE SODIUM 40 MG IV SOLR
40.0000 mg | Freq: Once | INTRAVENOUS | Status: AC
  Administered 2021-08-18: 40 mg via INTRAVENOUS
  Filled 2021-08-18: qty 40

## 2021-08-18 MED ORDER — VANCOMYCIN HCL IN DEXTROSE 1-5 GM/200ML-% IV SOLN
1000.0000 mg | Freq: Once | INTRAVENOUS | Status: DC
  Filled 2021-08-18: qty 200

## 2021-08-18 MED ORDER — LACTATED RINGERS IV SOLN: INTRAVENOUS | Status: DC

## 2021-08-18 MED ORDER — INSULIN ASPART 100 UNIT/ML IJ SOLN
0.0000 [IU] | INTRAMUSCULAR | Status: DC
  Administered 2021-08-18: 5 [IU] via SUBCUTANEOUS
  Administered 2021-08-19 (×2): 3 [IU] via SUBCUTANEOUS
  Administered 2021-08-19: 5 [IU] via SUBCUTANEOUS
  Administered 2021-08-19: 3 [IU] via SUBCUTANEOUS
  Filled 2021-08-18 (×5): qty 1

## 2021-08-18 MED ORDER — SODIUM CHLORIDE 0.9 % IV SOLN: 10.0000 mL/h | Freq: Once | INTRAVENOUS | Status: DC

## 2021-08-18 MED ORDER — INSULIN ASPART 100 UNIT/ML IV SOLN
10.0000 [IU] | Freq: Once | INTRAVENOUS | Status: AC
  Administered 2021-08-18: 10 [IU] via INTRAVENOUS
  Filled 2021-08-18: qty 0.1

## 2021-08-18 MED ORDER — SODIUM CHLORIDE 0.9 % IV BOLUS
1000.0000 mL | Freq: Once | INTRAVENOUS | Status: AC
  Administered 2021-08-18: 1000 mL via INTRAVENOUS

## 2021-08-18 MED ORDER — DOCUSATE SODIUM 100 MG PO CAPS: 100.0000 mg | ORAL_CAPSULE | Freq: Two times a day (BID) | ORAL | Status: DC | PRN

## 2021-08-18 MED ORDER — SODIUM CHLORIDE 0.9 % IV SOLN
500.0000 mg | Freq: Once | INTRAVENOUS | Status: AC
  Administered 2021-08-18: 500 mg via INTRAVENOUS
  Filled 2021-08-18: qty 500

## 2021-08-18 MED ORDER — PANTOPRAZOLE SODIUM 40 MG IV SOLR
40.0000 mg | Freq: Two times a day (BID) | INTRAVENOUS | Status: DC
  Administered 2021-08-18 – 2021-08-19 (×2): 40 mg via INTRAVENOUS
  Filled 2021-08-18 (×2): qty 40

## 2021-08-18 NOTE — Progress Notes (Signed)
Elink following sepsis bundle. °

## 2021-08-18 NOTE — ED Notes (Signed)
Pt in room with NP Rust chester Pt going to ct

## 2021-08-18 NOTE — Progress Notes (Signed)
Consult for USG PIV for vasopressors.  22g x1.75 inch placed in left posterior forearm.  Indelible ink X marked at catheter tip.  Spoke with Annette Stable, RN regarding this PIV to be used for vasopressors and IV watch to be placed at X.  States IV watch not used in ER, aware that this needs to be placed at mark when patient moves to ICU.

## 2021-08-18 NOTE — H&P (Addendum)
NAME:  [name] [name], MRN:  [id number], DOB:  07-28-1928, LOS: 0 ADMISSION DATE:  08/18/2021, CONSULTATION DATE:  08/18/2021 REFERRING MD:  Dr. Joni Fears, CHIEF COMPLAINT:  Seizures   History of Present Illness:  87 yo M presenting to [facility] ED on 08/18/21 via EMS from [facility] with complaints of possible seizure activity. No history of seizures. ED course: Per ED documentation, patient was alert & confused on arrival. He was recently prescribed Avelox for pneumonia, due to suspected aspiration. Per the patient's daughter this treatment began this morning (`12/2). Pt worked up via Sepsis protocol. EDP described report from the SNF as sounding closer to syncope then seizure-like activity. Lab work revealed severe anemia, and occult stool was (+) but per EDP grossly brown in color. Medications given: Azithromycin, Cefepime & Vancomycin, 3.5 L of NS bolus, protonix IV x 1 Initial Vitals: afebrile-97.7, RR 21, HR 63, BP-101/49 & spO2 91% on RA Significant labs: (Labs/ Imaging personally reviewed) I, Domingo Pulse [place], AGACNP-BC, personally viewed and interpreted this ECG. EKG Interpretation: pacemaker in place, artifact in multiple leads Chemistry: Na+:134, K+: 5.5, BUN/Cr.: 62/2.23, Serum CO2/ AG: 23/8, AST: 43, ALT: 46 Hematology: WBC: 12.5, Hgb: 5.6,  Lactic/ PCT: 2.4 > 3.1/ <0.10, COVID-19 & Influenza A/B: negative CXR 08/18/21: Bibasilar areas of atelectasis/consolidation with superimposed moderate R and small L pleural effusions CT abdomen/pelvis 08/18/21: pending  PCCM consulted for admission due to severe sepsis with shock and vasopressor administration.  Pertinent  Medical History  Atrial Fibrillation on Eliquis T2DM HLD HTN Acquired hypothyroidism PAD SSS s/p PPM  Significant Hospital Events: Including procedures, antibiotic start and stop dates in addition to other pertinent events   08/18/21: Admit to ICU with sepsis secondary to suspected aspiration pneumonia, possible new  onset seizures & symptomatic anemia in shock on vasopressor support  Interim History / Subjective:  Patient A&O, VERY Hard of Hearing on multiple peripheral vasopressors awaiting blood transfusion & CT of abdomen and pelvis. Pt describes pain in in groin-over inguinal hernia site to be in line with normal discomfort.  Addendum: per ED RN patient lost consciousness in CT, eyes rolled into back of head and he began have full upper body tremors. Unclear how long tremors last but care RN said it was 2 minutes before he was responsive again. No medication given, tremors self-sustained. CT was aborted and patient transported to ICU.  Objective   Blood pressure (!) 80/44, pulse (!) 59, temperature 97.7 F (36.5 C), temperature source Oral, resp. rate 15, weight 75 kg, SpO2 95 %.        Intake/Output Summary (Last 24 hours) at 08/18/2021 1907 Last data filed at 08/18/2021 1738 Gross per 24 hour  Intake 1500 ml  Output --  Net 1500 ml   Filed Weights   08/18/21 1520  Weight: 75 kg    Examination: General: Adult male, very pale appearing, critically ill, lying in bed, NAD HEENT: MM pink/dry, anicteric, atraumatic, neck supple, VERY H of H Neuro: A&O x 3, able to follow commands, PERRL +3, MAE CV: s1s2 RRR, dual PPM on monitor, no r/m/g Pulm: Regular, non labored on RA, breath sounds coarse rhonchi-BUL & diminished-BLL GI: soft, rounded with RIGHT inguinal hernia, non tender, bs x 4 GU: external catheter in place  Skin: known chronic wound on R ankle, no rashes/lesions noted Extremities: warm/dry, pulses + 2 R/P, +1 edema noted BLE  Resolved Hospital Problem list     Assessment & Plan:  Sepsis with multifocal shock due to Pneumonia  secondary to suspected Aspiration in the setting of symptomatic anemia Suspected Aspiration Lactic: 2.4 > 3.1, Baseline PCT: < 0.10, UA: pending, CXR: Bibasilar areas of atelectasis/consolidation with superimposed moderate R and small L pleural effusions, CT:  pending  Initial interventions/workup included: 3.5 L of NS & Azithromycin/Cefepime/ Vancomycin - Supplemental oxygen as needed, to maintain SpO2 > 90% - f/u cultures, trend lactic/ PCT - Daily CBC, monitor WBC/ fever curve - IV antibiotics:  zosyn  - Continue peripheral vasopressors to maintain MAP< 65, norepinephrine & phenylephrine - Strict I/O's: alert provider if UOP < 0.5 mL/kg/hr - aggressive pulmonary toileting: IS, flutter valve, CPT using bed - SLP eval ordered  Symptomatic Anemia secondary to Acute vs Chronic Blood Loss in the setting of chronic anticoagulation PMHx: A-fib on Eliquis No reports of bleeding from patient, occult stool (+) per EDP but stool on observation grossly brown. Hgb: 5.6, INR: 2.4 - continue 2 units of PRBC's as ordered once crossmatch completed - Protonix BID, give additional 40 mg tonight to complete initial 80 mg  - Eliquis on hold - Monitor for s/s of bleeding - Daily CBC, serial H&H until stable - Transfuse for Hgb <7 - consult Gastroenterology  Acute Kidney Injury in the setting of multifocal circulatory shock Hyperkalemia- 5.5 Baseline Cr: 0.9, Cr on admission: 2.23 - insulin & D50 ordered acutely per-blood, f/u BMP post blood transfusion - Strict I/O's: alert provider if UOP < 0.5 mL/kg/hr - gentle IVF hydration  - Daily BMP, replace electrolytes PRN - Avoid nephrotoxic agents as able, ensure adequate renal perfusion  Suspected New-Onset Seizures Report from SNF initially sounded like syncope but second reported episode of seizure like activity with ED RN present. - Keppra loading dose ordered, followed by 500 mg BID - EEG ordered for AM - seizure & falls precautions - consult neurology  Chronic Atrial Fibrillation  HLD CHA2DS2-VASc score: 5 - Eliquis on hold due to Hgb drop and occult stool (+) - SCD's in place - consider restarting zetia once safe swallowing confirmed by SLP - outpatient diltiazem on hold due to hypotension,  consider restarting as patient stabilizes - Continuous cardiac monitoring  Type 2 Diabetes Mellitus Neuropathy Hemoglobin A1C: pending - Monitor CBG Q 4 hours - SSI moderate dosing - target range while in ICU: 140-180 - follow ICU hyper/hypo-glycemia protocol - restart gabapentin once safe swallowing confirmed by SLP - outpatient Januvia on hold  Mild Transaminitis secondary to circulatory shock in the setting of sepsis & symptomatic anemia - Trend hepatic function - CT abdomen/pelvis pending - avoid hepatotoxic agents - GI consulted as above  Hypothyroidism - continue outpatient synthroid as IV due to aspiration risk after 3 day hold - consider restarting PO if safe swallowing is confirmed by SLP  Best Practice (right click and "Reselect all SmartList Selections" daily)  Diet/type: NPO DVT prophylaxis: SCD GI prophylaxis: PPI Lines: N/A Foley:  N/A Code Status:  DNR Last date of multidisciplinary goals of care discussion [08/18/21]  Labs   CBC: Recent Labs  Lab 08/18/21 1604  WBC 12.5*  NEUTROABS 10.8*  HGB 5.6*  HCT 18.9*  MCV 73.3*  PLT [id number]    Basic Metabolic Panel: Recent Labs  Lab 08/18/21 1604  NA 134*  K 5.5*  CL 103  CO2 23  GLUCOSE 262*  BUN 62*  CREATININE 2.23*  CALCIUM 8.9   GFR: CrCl cannot be calculated (Unknown ideal weight.). Recent Labs  Lab 08/18/21 1604 08/18/21 1758  PROCALCITON <0.10  --   WBC  12.5*  --   LATICACIDVEN 2.4* 3.1*    Liver Function Tests: Recent Labs  Lab 08/18/21 1604  AST 43*  ALT 46*  ALKPHOS 81  BILITOT 0.9  PROT 6.2*  ALBUMIN 3.3*   No results for input(s): LIPASE, AMYLASE in the last 168 hours. No results for input(s): AMMONIA in the last 168 hours.  ABG No results found for: PHART, PCO2ART, PO2ART, HCO3, TCO2, ACIDBASEDEF, O2SAT   Coagulation Profile: Recent Labs  Lab 08/18/21 1604  INR 2.4*    Cardiac Enzymes: No results for input(s): CKTOTAL, CKMB, CKMBINDEX, TROPONINI in the last  168 hours.  HbA1C: Hgb A1c MFr Bld  Date/Time Value Ref Range Status  09/10/2020 04:45 PM 7.6 (H) 4.8 - 5.6 % Final    Comment:    (NOTE) Pre diabetes:          5.7%-6.4%  Diabetes:              >6.4%  Glycemic control for   <7.0% adults with diabetes     CBG: No results for input(s): GLUCAP in the last 168 hours.  Review of Systems: Positives in BOLD   Gen: Denies fever, chills, weight change, fatigue, night sweats HEENT: Denies blurred vision, double vision, hearing loss, tinnitus, sinus congestion, rhinorrhea, sore throat, neck stiffness, dysphagia PULM: Denies shortness of breath, cough, sputum production, hemoptysis, wheezing CV: Denies chest pain, edema, orthopnea, paroxysmal nocturnal dyspnea, palpitations GI: Denies abdominal pain, nausea, vomiting, diarrhea, hematochezia, melena, constipation, change in bowel habits GU: Denies dysuria, hematuria, polyuria, oliguria, urethral discharge Endocrine: Denies hot or cold intolerance, polyuria, polyphagia or appetite change Derm: Denies rash, dry skin, scaling or peeling skin change Heme: Denies easy bruising, bleeding, bleeding gums Neuro: Denies headache, numbness, weakness, slurred speech, loss of memory or consciousness  Past Medical History:  He,  has a past medical history of Atrial fibrillation ([place]), Diabetes mellitus without complication ([place]), Hyperlipidemia, and Hypertension.   Surgical History:   Past Surgical History:  Procedure Laterality Date   APPENDECTOMY     CHOLECYSTECTOMY     TONSILLECTOMY       Social History:   reports that he has quit smoking. He has never used smokeless tobacco. He reports that he does not drink alcohol and does not use drugs.   Family History:  His family history is not on file.   Allergies Allergies  Allergen Reactions   Statins Palpitations     Home Medications  Prior to Admission medications   Medication Sig Start Date End Date Taking? Authorizing Provider   acetaminophen (TYLENOL) 500 MG tablet Take 500 mg by mouth every 8 (eight) hours as needed for mild pain.   Yes [provider]  apixaban (ELIQUIS) 5 MG TABS tablet Take 5 mg by mouth 2 (two) times daily.   Yes [provider]  Cholecalciferol 25 MCG (1000 UT) tablet Take 1,000 mg by mouth daily. 08/24/20  Yes [provider]  cyanocobalamin 1000 MCG tablet Take 1,000 mcg by mouth daily.   Yes [provider]  diltiazem (TIAZAC) 180 MG 24 hr capsule Take 180 mg by mouth daily. 09/07/20  Yes [provider]  ezetimibe (ZETIA) 10 MG tablet Take 10 mg by mouth daily. 08/09/20  Yes [provider]  finasteride (PROSCAR) 5 MG tablet Take 5 mg by mouth daily. 08/24/20  Yes [provider]  furosemide (LASIX) 20 MG tablet Take 20 mg by mouth every other day.   Yes [provider]  gabapentin (  NEURONTIN) 300 MG capsule Take 300 mg by mouth at bedtime.   Yes [provider]  levothyroxine (SYNTHROID) 125 MCG tablet Take 125 mcg by mouth daily before breakfast.   Yes [provider]  moxifloxacin (AVELOX) 400 MG tablet Take 400 mg by mouth in the morning. 08/17/21 08/26/21 Yes [provider]  Multiple Vitamin (MULTIVITAMIN WITH MINERALS) TABS tablet Take 1 tablet by mouth daily.   Yes [provider]  psyllium (METAMUCIL SMOOTH TEXTURE) 58.6 % powder Take 1 packet by mouth every evening.   Yes [provider]  sitaGLIPtin (JANUVIA) 100 MG tablet Take 100 mg by mouth daily.   Yes [provider]  Skin Protectants, Misc. (CALAZIME SKIN PROTECTANT EX) Apply 1 application topically as needed (periwound area).   Yes [provider]  tamsulosin (FLOMAX) 0.4 MG CAPS capsule Take 0.4 mg by mouth at bedtime. 08/09/20  Yes [provider]     Critical care time: 60 minutes       Betsey Holiday, AGACNP-BC Acute Care Nurse Practitioner [facility]   [phone] / [phone] Please see Amion for pager details.

## 2021-08-18 NOTE — Consult Note (Signed)
CODE SEPSIS - PHARMACY COMMUNICATION  **Broad Spectrum Antibiotics should be administered within 1 hour of Sepsis diagnosis**  Time Code Sepsis Called/Page Received: 1557  Antibiotics Ordered: 1557  Time of 1st antibiotic administration: 1622  Additional action taken by pharmacy: N/A  If necessary, Name of Provider/Nurse Contacted: N/A    Derrek Gu ,PharmD Clinical Pharmacist  08/18/2021  4:00 PM

## 2021-08-18 NOTE — Consult Note (Signed)
Pharmacy Antibiotic Note  [name] [name] is a 87 y.o. male w/ h/o Atrial Fibrillation (on Eliquis), T2DM, HLD, HTN, Acq'd hypothyroidism, PAD, & SSS (s/p PPM) admitted on 08/18/2021 with c/f possible seizure activity (no prior history) and aspiration pneumonia.  Pharmacy has been consulted for Zosyn dosing.  Plan: Zosyn 3.375g IV q8h (4 hour infusion).  Weight: 75 kg (165 lb 5.5 oz)  Temp (24hrs), Avg:97.6 F (36.4 C), Min:97.4 F (36.3 C), Max:97.7 F (36.5 C)  Recent Labs  Lab 08/18/21 1604 08/18/21 1758  WBC 12.5*  --   CREATININE 2.23*  --   LATICACIDVEN 2.4* 3.1*    CrCl cannot be calculated (Unknown ideal weight.).    Allergies  Allergen Reactions   Statins Palpitations    Antimicrobials this admission: VAN/CFP/Azith x1 in ED (12/2) Zosyn (12/2 >>   Dose adjustments this admission: CTM closely ISO AKI (Scr 2.23; last known baseline ~0.9-1)  Microbiology results: 12/2 BCx: pending 12/2 UCx: pending  12/2 Flu/Cov: negative  12/2 MRSA PCR: pending  Thank you for allowing pharmacy to be a part of this patient's care.  [name] [name] [name] [name] 08/18/2021 9:09 PM

## 2021-08-18 NOTE — ED Triage Notes (Signed)
Pt ems from [facility] for possible seizure activity. Pt without seizure hx. Pt alert, confused. Per pt, he is being treated for pneumonia. pt with pacemaker.

## 2021-08-18 NOTE — ED Provider Notes (Signed)
[facility] Emergency Department Provider Note  ____________________________________________  Time seen: Approximately 3:52 PM  I have reviewed the triage vital signs and the nursing notes.   HISTORY  Chief Complaint Seizures    Level 5 Caveat: Portions of the History and Physical including HPI and review of systems are unable to be completely obtained due to patient being a poor historian   HPI [name] [name] is a 87 y.o. male with a history of atrial fibrillation diabetes hypertension who [facility] due to a possible seizure.  He does not have any history of seizures.  He did not fall, but per EMS had an apparent loss of consciousness associated with some involuntary muscle activity.  No recent trauma.  He does note that he was started on antibiotics recently for a presumed aspiration pneumonia.  Denies black or bloody bowel movements.  He is on Xarelto.  CODE STATUS is DNR.    Past Medical History:  Diagnosis Date   Atrial fibrillation (HCC)    Diabetes mellitus without complication (HCC)    Hyperlipidemia    Hypertension      Patient Active Problem List   Diagnosis Date Noted   Gross hematuria 09/10/2020   Diabetes mellitus without complication (HCC)    Hypertension    PAD (peripheral artery disease) (HCC)    Osteomyelitis, chronic, lower leg, left (HCC)    Hypothyroid    Lung nodule seen on imaging study      Past Surgical History:  Procedure Laterality Date   APPENDECTOMY     CHOLECYSTECTOMY     TONSILLECTOMY       Prior to Admission medications   Medication Sig Start Date End Date Taking? Authorizing Provider  acetaminophen (TYLENOL) 500 MG tablet Take 500 mg by mouth every 8 (eight) hours as needed for mild pain.   Yes [provider]  apixaban (ELIQUIS) 5 MG TABS tablet Take 5 mg by mouth 2 (two) times daily.   Yes [provider]  Cholecalciferol 25 MCG (1000  UT) tablet Take 1,000 mg by mouth daily. 08/24/20  Yes [provider]  cyanocobalamin 1000 MCG tablet Take 1,000 mcg by mouth daily.   Yes [provider]  diltiazem (TIAZAC) 180 MG 24 hr capsule Take 180 mg by mouth daily. 09/07/20  Yes [provider]  ezetimibe (ZETIA) 10 MG tablet Take 10 mg by mouth daily. 08/09/20  Yes [provider]  finasteride (PROSCAR) 5 MG tablet Take 5 mg by mouth daily. 08/24/20  Yes [provider]  furosemide (LASIX) 20 MG tablet Take 20 mg by mouth every other day.   Yes [provider]  gabapentin (NEURONTIN) 300 MG capsule Take 300 mg by mouth at bedtime.   Yes [provider]  levothyroxine (SYNTHROID) 125 MCG tablet Take 125 mcg by mouth daily before breakfast.   Yes [provider]  moxifloxacin (AVELOX) 400 MG tablet Take 400 mg by mouth in the morning. 08/17/21 08/26/21 Yes [provider]  Multiple Vitamin (MULTIVITAMIN WITH MINERALS) TABS tablet Take 1 tablet by mouth daily.   Yes [provider]  psyllium (METAMUCIL SMOOTH TEXTURE) 58.6 % powder Take 1 packet by mouth every evening.   Yes [provider]  sitaGLIPtin (JANUVIA) 100 MG tablet Take 100 mg by mouth daily.   Yes [provider]  Skin Protectants, Misc. (CALAZIME SKIN PROTECTANT EX) Apply 1 application topically as needed (periwound area).  Yes [provider]  tamsulosin (FLOMAX) 0.4 MG CAPS capsule Take 0.4 mg by mouth at bedtime. 08/09/20  Yes [provider]     Allergies Statins   History reviewed. No pertinent family history.  Social History Social History   Tobacco Use   Smoking status: Former   Smokeless tobacco: Never  Building services engineer Use: Never used  Substance Use Topics   Alcohol use: Never   Drug use: Never    Review of Systems Level 5 Caveat: Portions of the History and Physical including HPI and review of systems are unable to be  completely obtained due to patient being a poor historian   Constitutional:   No known fever.  ENT:   No rhinorrhea. Cardiovascular:   No chest pain or syncope. Respiratory:   No dyspnea or cough. Gastrointestinal:   Negative for abdominal pain, vomiting and diarrhea.  Musculoskeletal:   Negative for focal pain or swelling ____________________________________________   PHYSICAL EXAM:  VITAL SIGNS: ED Triage Vitals  Enc Vitals Group     BP 08/18/21 1519 (!) 101/49     Pulse Rate 08/18/21 1519 63     Resp 08/18/21 1519 (!) 21     Temp 08/18/21 1519 97.7 F (36.5 C)     Temp Source 08/18/21 1519 Oral     SpO2 08/18/21 1519 99 %     Weight 08/18/21 1520 165 lb 5.5 oz (75 kg)     Height --      Head Circumference --      Peak Flow --      Pain Score 08/18/21 1520 0     Pain Loc --      Pain Edu? --      Excl. in GC? --     Vital signs reviewed, nursing assessments reviewed.   Constitutional:   Alert and oriented.  Ill-appearing. Eyes:   Conjunctivae are normal. EOMI. PERRL. ENT      Head:   Normocephalic and atraumatic.      Nose:   No congestion/rhinnorhea.       Mouth/Throat:   Dry mucous membranes, no pharyngeal erythema. No peritonsillar mass.       Neck:   No meningismus. Full ROM. Hematological/Lymphatic/Immunilogical:   No cervical lymphadenopathy. Cardiovascular:   RRR. Symmetric bilateral radial and DP pulses.  No murmurs. Cap refill less than 2 seconds. Respiratory:   Normal respiratory effort without tachypnea/retractions.  There are diffuse crackles. Gastrointestinal:   Soft and nontender. Non distended. There is no CVA tenderness.  No rebound, rigidity, or guarding.  Rectal exam shows brown stool, strongly Hemoccult positive Genitourinary:   Normal Musculoskeletal:   Normal range of motion in all extremities. No joint effusions.  No lower extremity tenderness.  No edema. Neurologic:   Normal speech and language.  Motor grossly intact. No acute focal  neurologic deficits are appreciated.  Skin:    Skin is warm, dry and intact. No rash noted.  No petechiae, purpura, or bullae.  ____________________________________________    LABS (pertinent positives/negatives) (all labs ordered are listed, but only abnormal results are displayed) Labs Reviewed  LACTIC ACID, PLASMA - Abnormal; Notable for the following components:      Result Value   Lactic Acid, Venous 2.4 (*)    All other components within normal limits  COMPREHENSIVE METABOLIC PANEL - Abnormal; Notable for the following components:   Sodium 134 (*)    Potassium 5.5 (*)    Glucose, Bld 262 (*)  BUN 62 (*)    Creatinine, Ser 2.23 (*)    Total Protein 6.2 (*)    Albumin 3.3 (*)    AST 43 (*)    ALT 46 (*)    GFR, Estimated 27 (*)    All other components within normal limits  CBC WITH DIFFERENTIAL/PLATELET - Abnormal; Notable for the following components:   WBC 12.5 (*)    RBC 2.58 (*)    Hemoglobin 5.6 (*)    HCT 18.9 (*)    MCV 73.3 (*)    MCH 21.7 (*)    MCHC 29.6 (*)    RDW 16.7 (*)    Neutro Abs 10.8 (*)    Lymphs Abs 0.5 (*)    Monocytes Absolute 1.2 (*)    All other components within normal limits  PROTIME-INR - Abnormal; Notable for the following components:   Prothrombin Time 25.9 (*)    INR 2.4 (*)    All other components within normal limits  APTT - Abnormal; Notable for the following components:   aPTT 42 (*)    All other components within normal limits  RESP PANEL BY RT-PCR (FLU A&B, COVID) ARPGX2  CULTURE, BLOOD (ROUTINE X 2)  CULTURE, BLOOD (ROUTINE X 2)  URINE CULTURE  PROCALCITONIN  LACTIC ACID, PLASMA  URINALYSIS, COMPLETE (UACMP) WITH MICROSCOPIC  PREPARE RBC (CROSSMATCH)  TYPE AND SCREEN   ____________________________________________   EKG  Interpreted by me Atrial fibrillation, rate of 60.  Left axis, nonspecific IVCD.  Likely left bundle.  No acute ischemic changes  ____________________________________________     RADIOLOGY  DG Chest Port 1 View  Result Date: 08/18/2021 CLINICAL DATA:  87 year old male with possible sepsis. EXAM: PORTABLE CHEST 1 VIEW COMPARISON:  No priors. FINDINGS: Lung volumes are low. Bibasilar opacities may reflect areas of atelectasis and/or consolidation. Moderate right and small left pleural effusions. No pneumothorax. No evidence of pulmonary edema. Heart size is mildly enlarged. Upper mediastinal contours are within normal limits. Left-sided pacemaker device in place with lead tips projecting over the expected location of the right atrium and right ventricle. IMPRESSION: 1. Bibasilar areas of atelectasis and/or consolidation with superimposed moderate right and small left pleural effusions. 2. Mild cardiomegaly. Electronically Signed   By: Trudie Reed M.D.   On: 08/18/2021 16:20    ____________________________________________   PROCEDURES .Critical Care Performed by: Sharman Cheek, MD Authorized by: Sharman Cheek, MD   Critical care provider statement:    Critical care time (minutes):  40   Critical care time was exclusive of:  Separately billable procedures and treating other patients   Critical care was necessary to treat or prevent imminent or life-threatening deterioration of the following conditions:  Sepsis, shock and circulatory failure   Critical care was time spent personally by me on the following activities:  Development of treatment plan with patient or surrogate, discussions with consultants, evaluation of patient's response to treatment, examination of patient, obtaining history from patient or surrogate, ordering and performing treatments and interventions, ordering and review of laboratory studies, ordering and review of radiographic studies, pulse oximetry, re-evaluation of patient's condition and review of old charts  ____________________________________________  DIFFERENTIAL DIAGNOSIS   Multifocal pneumonia, septic shock, GI bleed,  seizure, dehydration  CLINICAL IMPRESSION / ASSESSMENT AND PLAN / ED COURSE  Medications ordered in the ED: Medications  azithromycin (ZITHROMAX) 500 mg in sodium chloride 0.9 % 250 mL IVPB (500 mg Intravenous New Bag/Given 08/18/21 1711)  vancomycin (VANCOREADY) IVPB 1500 mg/300 mL (has no administration  in time range)  0.9 %  sodium chloride infusion (has no administration in time range)  pantoprazole (PROTONIX) injection 40 mg (has no administration in time range)  0.9 %  sodium chloride infusion (has no administration in time range)  norepinephrine (LEVOPHED) 4mg  in 255mL (0.016 mg/mL) premix infusion (has no administration in time range)  sodium chloride 0.9 % bolus 1,000 mL (0 mLs Intravenous Stopped 08/18/21 1656)    And  sodium chloride 0.9 % bolus 1,000 mL (1,000 mLs Intravenous New Bag/Given 08/18/21 1621)    And  sodium chloride 0.9 % bolus 500 mL (0 mLs Intravenous Stopped 08/18/21 1738)  ceFEPIme (MAXIPIME) 2 g in sodium chloride 0.9 % 100 mL IVPB (0 g Intravenous Stopped 08/18/21 1656)    Pertinent labs & imaging results that were available during my care of the patient were reviewed by me and considered in my medical decision making (see chart for details).   Bright Huizenga was evaluated in Emergency Department on 08/18/2021 for the symptoms described in the history of present illness. He was evaluated in the context of the global COVID-19 pandemic, which necessitated consideration that the patient might be at risk for infection with the SARS-CoV-2 virus that causes COVID-19. Institutional protocols and algorithms that pertain to the evaluation of patients at risk for COVID-19 are in a state of rapid change based on information released by regulatory bodies including the CDC and federal and state organizations. These policies and algorithms were followed during the patient's care in the ED.   Patient presents with hypotension, likely worsening pneumonia despite 2 days of treatment with  Avelox.  Worrisome for septic shock.  He also appears pale with clinically apparent occult GI bleed.  Clinical Course as of 08/19/21 0015  Fri Aug 18, 2021  1733 Sepsis reassessment has been completed. BP still 80/45 despite 2500 ml IVF.  Hb 5, hemoccult positive brown stool on DRE suggestive of occult UGIB. No brisk hemorrhage/melena. Will start transfusion, admit.  [PS]  [id number] Will need to start IV levophed for refractory hypotension and possible septic shock, though hopefully this can be weaned after transfusion [PS]    Clinical Course User Index [PS] Carrie Mew, MD     ____________________________________________   FINAL CLINICAL IMPRESSION(S) / ED DIAGNOSES    Final diagnoses:  Sepsis with acute hypoxic respiratory failure and septic shock, due to unspecified organism ([place])  Symptomatic anemia  Occult GI bleeding  Type 2 diabetes mellitus with hyperglycemia, unspecified whether long term insulin use ([place])  Chronic dementia [facility])     ED Discharge Orders     None       Portions of this note were generated with dragon dictation software. Dictation errors may occur despite best attempts at proofreading.   Carrie Mew, MD 08/19/21 Benancio Deeds

## 2021-08-18 NOTE — Progress Notes (Signed)
eLink Physician-Brief Progress Note Patient Name: [name] [name] DOB: 10/14/1927 MRN: [id number]   Date of Service  08/18/2021  HPI/Events of Note  Patient admitted with suspected sepsis, acute blood loss anemia secondary to suspected GI bleeding. He also has altered mental status,  eICU Interventions  New Patient  Evaluation.        Migdalia Dk 08/18/2021, 8:47 PM

## 2021-08-18 NOTE — Consult Note (Signed)
PHARMACY -  BRIEF ANTIBIOTIC NOTE   Pharmacy has received consult(s) for vancomycin and cefepime from an ED provider.  The patient's profile has been reviewed for ht/wt/allergies/indication/available labs.    One time order(s) placed for cefepime 2 g and vancomycin 1500 mg IV   Further antibiotics/pharmacy consults should be ordered by admitting physician if indicated.                       Thank you, Derrek Gu, PharmD 08/18/2021  4:01 PM

## 2021-08-19 ENCOUNTER — Inpatient Hospital Stay: Payer: Medicare Other

## 2021-08-19 DIAGNOSIS — A419 Sepsis, unspecified organism: Secondary | ICD-10-CM

## 2021-08-19 DIAGNOSIS — R569 Unspecified convulsions: Secondary | ICD-10-CM

## 2021-08-19 DIAGNOSIS — N170 Acute kidney failure with tubular necrosis: Secondary | ICD-10-CM

## 2021-08-19 DIAGNOSIS — J9601 Acute respiratory failure with hypoxia: Secondary | ICD-10-CM

## 2021-08-19 DIAGNOSIS — D649 Anemia, unspecified: Secondary | ICD-10-CM

## 2021-08-19 DIAGNOSIS — R6521 Severe sepsis with septic shock: Secondary | ICD-10-CM

## 2021-08-19 DIAGNOSIS — E1165 Type 2 diabetes mellitus with hyperglycemia: Secondary | ICD-10-CM

## 2021-08-19 LAB — LACTIC ACID, PLASMA: Lactic Acid, Venous: 1.5 mmol/L (ref 0.5–1.9)

## 2021-08-19 LAB — TYPE AND SCREEN
ABO/RH(D): O POS
Antibody Screen: NEGATIVE
Unit division: 0
Unit division: 0

## 2021-08-19 LAB — BASIC METABOLIC PANEL
Anion gap: 7 (ref 5–15)
BUN: 62 mg/dL — ABNORMAL HIGH (ref 8–23)
CO2: 20 mmol/L — ABNORMAL LOW (ref 22–32)
Calcium: 8 mg/dL — ABNORMAL LOW (ref 8.9–10.3)
Chloride: 106 mmol/L (ref 98–111)
Creatinine, Ser: 2.15 mg/dL — ABNORMAL HIGH (ref 0.61–1.24)
GFR, Estimated: 28 mL/min — ABNORMAL LOW (ref >60)
Glucose, Bld: 165 mg/dL — ABNORMAL HIGH (ref 70–99)
Potassium: 4.6 mmol/L (ref 3.5–5.1)
Sodium: 133 mmol/L — ABNORMAL LOW (ref 135–145)

## 2021-08-19 LAB — PROTIME-INR
INR: 2.4 — ABNORMAL HIGH (ref 0.8–1.2)
Prothrombin Time: 26.5 seconds — ABNORMAL HIGH (ref 11.4–15.2)

## 2021-08-19 LAB — GLUCOSE, CAPILLARY
Glucose-Capillary: 209 mg/dL — ABNORMAL HIGH (ref 70–99)
Glucose-Capillary: 198 mg/dL — ABNORMAL HIGH (ref 70–99)
Glucose-Capillary: 158 mg/dL — ABNORMAL HIGH (ref 70–99)
Glucose-Capillary: 72 mg/dL (ref 70–99)
Glucose-Capillary: 170 mg/dL — ABNORMAL HIGH (ref 70–99)
Glucose-Capillary: 238 mg/dL — ABNORMAL HIGH (ref 70–99)
Glucose-Capillary: 215 mg/dL — ABNORMAL HIGH (ref 70–99)

## 2021-08-19 LAB — BPAM RBC
Blood Product Expiration Date: 202301102359
Blood Product Expiration Date: 202301102359
ISSUE DATE / TIME: 202212022042
ISSUE DATE / TIME: 202212022336
Unit Type and Rh: 5100
Unit Type and Rh: 5100

## 2021-08-19 LAB — CBC
HCT: 26.4 % — ABNORMAL LOW (ref 39.0–52.0)
Hemoglobin: 8.3 g/dL — ABNORMAL LOW (ref 13.0–17.0)
MCH: 24.3 pg — ABNORMAL LOW (ref 26.0–34.0)
MCHC: 31.4 g/dL (ref 30.0–36.0)
MCV: 77.2 fL — ABNORMAL LOW (ref 80.0–100.0)
Platelets: 180 10*3/uL (ref 150–400)
RBC: 3.42 MIL/uL — ABNORMAL LOW (ref 4.22–5.81)
RDW: 18.5 % — ABNORMAL HIGH (ref 11.5–15.5)
WBC: 12 10*3/uL — ABNORMAL HIGH (ref 4.0–10.5)
nRBC: 0.5 % — ABNORMAL HIGH (ref 0.0–0.2)

## 2021-08-19 LAB — PHOSPHORUS: Phosphorus: 5 mg/dL — ABNORMAL HIGH (ref 2.5–4.6)

## 2021-08-19 LAB — MAGNESIUM: Magnesium: 2.1 mg/dL (ref 1.7–2.4)

## 2021-08-19 LAB — PROCALCITONIN: Procalcitonin: 0.1 ng/mL

## 2021-08-19 LAB — AMMONIA: Ammonia: <10 umol/L (ref 9–35)

## 2021-08-19 LAB — HEMOGLOBIN AND HEMATOCRIT, BLOOD
HCT: 26.1 % — ABNORMAL LOW (ref 39.0–52.0)
Hemoglobin: 8.2 g/dL — ABNORMAL LOW (ref 13.0–17.0)

## 2021-08-19 MED ORDER — TAMSULOSIN HCL 0.4 MG PO CAPS
0.4000 mg | ORAL_CAPSULE | Freq: Every day | ORAL | Status: DC
  Administered 2021-08-19 – 2021-08-23 (×5): 0.4 mg via ORAL
  Filled 2021-08-19 (×6): qty 1

## 2021-08-19 MED ORDER — INSULIN ASPART 100 UNIT/ML IJ SOLN
0.0000 [IU] | Freq: Every day | INTRAMUSCULAR | Status: DC
  Administered 2021-08-19: 2 [IU] via SUBCUTANEOUS
  Filled 2021-08-19: qty 1

## 2021-08-19 MED ORDER — ADULT MULTIVITAMIN W/MINERALS CH
1.0000 | ORAL_TABLET | Freq: Every day | ORAL | Status: DC
  Administered 2021-08-20 – 2021-08-24 (×4): 1 via ORAL
  Filled 2021-08-19 (×4): qty 1

## 2021-08-19 MED ORDER — FINASTERIDE 5 MG PO TABS
5.0000 mg | ORAL_TABLET | Freq: Every day | ORAL | Status: DC
  Administered 2021-08-19 – 2021-08-24 (×6): 5 mg via ORAL
  Filled 2021-08-19 (×6): qty 1

## 2021-08-19 MED ORDER — LEVOTHYROXINE SODIUM 25 MCG PO TABS
125.0000 ug | ORAL_TABLET | Freq: Every day | ORAL | Status: DC
  Administered 2021-08-20 – 2021-08-24 (×5): 125 ug via ORAL
  Filled 2021-08-19 (×6): qty 1

## 2021-08-19 MED ORDER — VITAMIN D3 25 MCG (1000 UNIT) PO TABS
ORAL_TABLET | Freq: Every day | ORAL | Status: DC
  Administered 2021-08-20 – 2021-08-24 (×5): 1000 [IU] via ORAL
  Filled 2021-08-19 (×9): qty 1

## 2021-08-19 MED ORDER — VITAMIN B-12 1000 MCG PO TABS
1000.0000 ug | ORAL_TABLET | Freq: Every day | ORAL | Status: DC
  Administered 2021-08-20 – 2021-08-24 (×5): 1000 ug via ORAL
  Filled 2021-08-19 (×5): qty 1

## 2021-08-19 MED ORDER — PSYLLIUM 95 % PO PACK
1.0000 | PACK | Freq: Every evening | ORAL | Status: DC
  Administered 2021-08-20 – 2021-08-23 (×4): 1 via ORAL
  Filled 2021-08-19 (×5): qty 1
  Filled 2021-08-19: qty 30

## 2021-08-19 MED ORDER — MORPHINE SULFATE (PF) 2 MG/ML IV SOLN: 1.0000 mg | INTRAVENOUS | Status: DC | PRN

## 2021-08-19 MED ORDER — INSULIN ASPART 100 UNIT/ML IJ SOLN
0.0000 [IU] | Freq: Three times a day (TID) | INTRAMUSCULAR | Status: DC
  Administered 2021-08-20: 18:00:00 3 [IU] via SUBCUTANEOUS
  Administered 2021-08-20 – 2021-08-22 (×6): 1 [IU] via SUBCUTANEOUS
  Administered 2021-08-22: 13:00:00 3 [IU] via SUBCUTANEOUS
  Administered 2021-08-23: 09:00:00 1 [IU] via SUBCUTANEOUS
  Administered 2021-08-23 – 2021-08-24 (×2): 2 [IU] via SUBCUTANEOUS
  Filled 2021-08-19 (×9): qty 1

## 2021-08-19 MED ORDER — PANTOPRAZOLE SODIUM 40 MG IV SOLR
40.0000 mg | INTRAVENOUS | Status: DC
  Administered 2021-08-20 – 2021-08-22 (×3): 40 mg via INTRAVENOUS
  Filled 2021-08-19 (×3): qty 40

## 2021-08-19 MED ORDER — GABAPENTIN 300 MG PO CAPS: 300.0000 mg | ORAL_CAPSULE | Freq: Every day | ORAL | Status: DC

## 2021-08-19 MED ORDER — CHLORHEXIDINE GLUCONATE CLOTH 2 % EX PADS
6.0000 | MEDICATED_PAD | Freq: Every day | CUTANEOUS | Status: DC
  Administered 2021-08-19 – 2021-08-20 (×2): 6 via TOPICAL

## 2021-08-19 MED ORDER — EZETIMIBE 10 MG PO TABS
10.0000 mg | ORAL_TABLET | Freq: Every day | ORAL | Status: DC
  Administered 2021-08-20 – 2021-08-24 (×5): 10 mg via ORAL
  Filled 2021-08-19 (×6): qty 1

## 2021-08-19 NOTE — Progress Notes (Signed)
Initial assessment done at this time.  Documented at 2000 by mistake.

## 2021-08-19 NOTE — Progress Notes (Signed)
Chaplain Maggie made initial visit at bedside. Pt was alert and welcomed conversation. Storytelling was central to this visit. Chaplain expects to follow up.

## 2021-08-19 NOTE — Consult Note (Signed)
Consultation  Referring Provider:   ICU NP   Admit date: 08/18/2021 Consult date: 08/19/2021         Reason for Consultation:  Undifferentiated Anemia            HPI:   [name] [name] is a 87 y.o. gentleman with history of peripheral arterial disease, DM II, a. Fib on NOAC, hemorrhagic cystitis, venous insufficiency, hypertension and sick sinus syndrome who presented to the hospital with septic shock and anemia without overt GI bleeding. Patient had brown stool on rectal exam in ED and no overt GI bleeding while hospitalized. We have been consulted for microcytic anemia. Patient with baseline hemoglobin of  8-9 over the past year. Patient denies any overt GI bleeding. States over the past couple of weeks has been getting weak and also has been aspirating his food. Takes NOAC for PAD and a. Fib. No complaints currently, no abdominal pain. CT scan showed inguinal hernia and focal proctitis, likely related to recently low blood pressure. Notes his father might have had colon cancer in his 8's. He doesn't remember when his last colonoscopy was but per chart review had unremarkable colonoscopy in 1996.   Past Medical History:  Diagnosis Date   Atrial fibrillation (HCC)    Diabetes mellitus without complication (HCC)    Hyperlipidemia    Hypertension     Past Surgical History:  Procedure Laterality Date   APPENDECTOMY     CHOLECYSTECTOMY     TONSILLECTOMY      History reviewed. No pertinent family history.   Social History   Tobacco Use   Smoking status: Former   Smokeless tobacco: Never  Building services engineer Use: Never used  Substance Use Topics   Alcohol use: Never   Drug use: Never    Prior to Admission medications   Medication Sig Start Date End Date Taking? Authorizing Provider  acetaminophen (TYLENOL) 500 MG tablet Take 500 mg by mouth every 8 (eight) hours as needed for mild pain.   Yes [provider]  apixaban (ELIQUIS) 5 MG TABS tablet Take 5 mg by mouth 2 (two)  times daily.   Yes [provider]  Cholecalciferol 25 MCG (1000 UT) tablet Take 1,000 mg by mouth daily. 08/24/20  Yes [provider]  cyanocobalamin 1000 MCG tablet Take 1,000 mcg by mouth daily.   Yes [provider]  diltiazem (TIAZAC) 180 MG 24 hr capsule Take 180 mg by mouth daily. 09/07/20  Yes [provider]  ezetimibe (ZETIA) 10 MG tablet Take 10 mg by mouth daily. 08/09/20  Yes [provider]  finasteride (PROSCAR) 5 MG tablet Take 5 mg by mouth daily. 08/24/20  Yes [provider]  furosemide (LASIX) 20 MG tablet Take 20 mg by mouth every other day.   Yes [provider]  gabapentin (NEURONTIN) 300 MG capsule Take 300 mg by mouth at bedtime.   Yes [provider]  levothyroxine (SYNTHROID) 125 MCG tablet Take 125 mcg by mouth daily before breakfast.   Yes [provider]  moxifloxacin (AVELOX) 400 MG tablet Take 400 mg by mouth in the morning. 08/17/21 08/26/21 Yes [provider]  Multiple Vitamin (MULTIVITAMIN WITH MINERALS) TABS tablet Take 1 tablet by mouth daily.   Yes [provider]  psyllium (METAMUCIL SMOOTH TEXTURE) 58.6 % powder Take 1 packet by mouth every evening.   Yes [provider]  sitaGLIPtin (JANUVIA) 100 MG tablet Take 100 mg by mouth daily.   Yes [provider]  Skin Protectants, Misc. (CALAZIME SKIN PROTECTANT EX) Apply 1 application topically as needed (periwound area).   Yes [provider]  tamsulosin (FLOMAX) 0.4 MG CAPS capsule Take 0.4 mg by mouth at bedtime. 08/09/20  Yes [provider]    Current Facility-Administered Medications  Medication Dose Route Frequency Provider Last Rate Last Admin   0.9 %  sodium chloride infusion  10 mL/hr Intravenous Once Sharman Cheek, MD       0.9 %  sodium chloride infusion  250 mL Intravenous Continuous Sharman Cheek, MD   Stopped at 08/19/21 9485   aspirin chewable tablet 324  mg  324 mg Oral NOW Erin Fulling, MD       Or   aspirin suppository 300 mg  300 mg Rectal NOW Erin Fulling, MD       Chlorhexidine Gluconate Cloth 2 % PADS 6 each  6 each Topical Daily Enedina Finner, MD   6 each at 08/19/21 1013   docusate sodium (COLACE) capsule 100 mg  100 mg Oral BID PRN Erin Fulling, MD       insulin aspart (novoLOG) injection 0-15 Units  0-15 Units Subcutaneous Q4H Rust-Chester, Cecelia Byars, NP   3 Units at 08/19/21 0830   levETIRAcetam (KEPPRA) IVPB 500 mg/100 mL premix  500 mg Intravenous Q12H Rust-Chester, Britton L, NP 400 mL/hr at 08/19/21 1041 500 mg at 08/19/21 1041   [START ON 08/21/2021] levothyroxine (SYNTHROID, LEVOTHROID) injection 62 mcg  62 mcg Intravenous Daily Rust-Chester, Britton L, NP       morphine 2 MG/ML injection 1 mg  1 mg Intravenous Q3H PRN Rust-Chester, Britton L, NP       pantoprazole (PROTONIX) injection 40 mg  40 mg Intravenous Q12H Rust-Chester, Britton L, NP   40 mg at 08/19/21 1013   phenylephrine (NEO-SYNEPHRINE) 20mg /NS premix infusion  0-200 mcg/min Intravenous Titrated Rust-Chester, , NP   Stopped at 08/19/21 0626   piperacillin-tazobactam (ZOSYN) IVPB 3.375 g  3.375 g Intravenous Q8H Beers, 14/03/22, RPH 12.5 mL/hr at 08/19/21 0800 Infusion Verify at 08/19/21 0800   polyethylene glycol (MIRALAX / GLYCOLAX) packet 17 g  17 g Oral Daily PRN 14/03/22, MD        Allergies as of 08/18/2021 - Review Complete 08/18/2021  Allergen Reaction Noted   Statins Palpitations 09/08/2020     Review of Systems:    All systems reviewed and negative except where noted in HPI.  Review of Systems  Constitutional:  Positive for malaise/fatigue. Negative for chills and fever.  Respiratory:  Positive for cough.   Cardiovascular:  Negative for chest pain.  Gastrointestinal:  Negative for abdominal pain, blood in stool, constipation, diarrhea, melena, nausea and vomiting.  Musculoskeletal:  Positive for joint pain.  Skin:  Negative for  itching and rash.  Neurological:  Positive for weakness.  Psychiatric/Behavioral:  Negative for substance abuse.   All other systems reviewed and are negative.    Physical Exam:  Vital signs in last 24 hours: Temp:  [94 F (34.4 C)-97.9 F (36.6 C)] 97.8 F (36.6 C) (12/03 0829) Pulse Rate:  [47-63] 60 (12/03 0829) Resp:  [11-21] 16 (12/03 0829) BP: (64-118)/(35-80) 99/59 (12/03 0800) SpO2:  [89 %-100 %] 89 % (12/03 0829) Weight:  [75 kg-86.1 kg] 86.1 kg (12/03 0500) Last BM Date: 08/18/21 General:   Pleasant in NAD Head:  Normocephalic and atraumatic. Eyes:   No icterus.   Conjunctiva pink. Mouth: Mucosa pink moist, no lesions. Neck:  Supple; no masses felt Lungs:  No respiratory distress Abdomen:   Flat, soft, nondistended, nontender Msk:  MAEW x4, No clubbing or cyanosis. Chronic venous insufficiency changes in lower extremities Neurologic:  No focal deficits Skin:  Chronic venous insufficiency changes Psych:  Alert and cooperative. Normal affect.  LAB RESULTS: Recent Labs    08/18/21 1604 08/19/21 0718 08/19/21 0951  WBC 12.5* 12.0*  --   HGB 5.6* 8.3* 8.2*  HCT 18.9* 26.4* 26.1*  PLT 200 180  --    BMET Recent Labs    08/18/21 1604 08/19/21 0718  NA 134* 133*  K 5.5* 4.6  CL 103 106  CO2 23 20*  GLUCOSE 262* 165*  BUN 62* 62*  CREATININE 2.23* 2.15*  CALCIUM 8.9 8.0*   LFT Recent Labs    08/18/21 1604  PROT 6.2*  ALBUMIN 3.3*  AST 43*  ALT 46*  ALKPHOS 81  BILITOT 0.9   PT/INR Recent Labs    08/18/21 1604 08/19/21 0718  LABPROT 25.9* 26.5*  INR 2.4* 2.4*    STUDIES: CT ABDOMEN PELVIS WO CONTRAST  Result Date: 08/19/2021 CLINICAL DATA:  Abdominal distension EXAM: CT ABDOMEN AND PELVIS WITHOUT CONTRAST TECHNIQUE: Multidetector CT imaging of the abdomen and pelvis was performed following the standard protocol without IV contrast. COMPARISON:  09/10/2020 FINDINGS: Lower chest: Bilateral pleural effusions are noted right greater than left.  Considerable material is noted within the lower lobe bronchial tree with associated patchy areas of consolidation. These changes could represent bilateral aspiration. Coronary calcifications are noted. Hepatobiliary: No focal liver abnormality is seen. Status post cholecystectomy. No biliary dilatation. Pancreas: Unremarkable. No pancreatic ductal dilatation or surrounding inflammatory changes. Spleen: Normal in size without focal abnormality. Adrenals/Urinary Tract: Adrenal glands are within normal limits bilaterally. Kidneys are well visualized bilaterally without renal calculi or obstructive changes. The ureters are within normal limits. The bladder is partially distended. Hypodensities are noted within the left kidney likely representing cysts. This is stable from the prior exam. Stomach/Bowel: Circumferential wall thickening is noted within the rectum which may represent some focal proctitis. No perforation is noted. The more proximal colon is within normal limits. The cecum extends inferiorly into a right inguinal hernia along with a loop of terminal ileum. No obstructive changes are seen. The more proximal small bowel is within normal limits with the exception of a small duodenal diverticulum adjacent to the head of pancreas. Stomach is decompressed. Appendix has been surgically removed Vascular/Lymphatic: Aortic atherosclerosis. No enlarged abdominal or pelvic lymph nodes. Reproductive: Prostate is unremarkable. Other: No abdominal wall hernia or abnormality. No abdominopelvic ascites. Musculoskeletal: Degenerative changes of the lumbar spine are noted. IMPRESSION: Right inguinal hernia containing a loop of terminal ileum as well as the cecum. No associated obstruction is noted. Circumferential wall thickening within the rectum which may represent some mild proctitis. Bilateral pleural effusions with associated lower lobe consolidation. Increased soft tissue density is noted within the lower lobe bronchial  tree bilaterally which may represent bilateral aspiration. Electronically Signed   By: Alcide Clever M.D.   On: 08/19/2021 00:58   DG Chest Port 1 View  Result Date: 08/18/2021 CLINICAL DATA:  87 year old male with possible sepsis. EXAM: PORTABLE CHEST 1 VIEW COMPARISON:  No priors. FINDINGS: Lung volumes are low. Bibasilar opacities may reflect areas of atelectasis and/or consolidation. Moderate right and small left pleural effusions. No pneumothorax. No evidence of pulmonary edema. Heart size is mildly enlarged. Upper mediastinal contours are within normal limits. Left-sided pacemaker device in place  with lead tips projecting over the expected location of the right atrium and right ventricle. IMPRESSION: 1. Bibasilar areas of atelectasis and/or consolidation with superimposed moderate right and small left pleural effusions. 2. Mild cardiomegaly. Electronically Signed   By: Trudie Reed M.D.   On: 08/18/2021 16:20       Impression / Plan:   87 y/o gentleman with microcytic anemia without overt GI bleeding here recovering from septic shock secondary to aspiration pneumonia. Given medical comorbidities and no active GI bleeding, will not plan on any invasive procedures at this time  - monitor daily cbc, transfuse for hemoglobin of < 7 - monitor for overt GI bleeding - speech eval - attempted to call daughter but phone went straight to voicemail  Please call with any questions or concerns  Merlyn Lot MD, MPH [facility] GI

## 2021-08-19 NOTE — Consult Note (Addendum)
Neurology Consultation Reason for Consult: Possible seizure Referring Physician: Rust-Chester, B  CC: Possible seizure  History is obtained from: Chart review, patient  HPI: [name] [name] is a 87 y.o. male who is hard of hearing, who reports no previous history of seizure.  He was admitted with anemia, suspected sepsis due to pneumonia who lost consciousness at his care facility.  There is apparently some unusual movements and there was some concern for seizure.  I do not have great details regarding this episode.  He was started on IV Keppra due to some ongoing myoclonus.  He feels overall weak, but states much better than he was doing.  He is slurring his words some, but states that this is normal for him early in the morning, and I awakened him from a nap.   ROS: A 14 point ROS was performed and is negative except as noted in the HPI.  Past Medical History:  Diagnosis Date   Atrial fibrillation (HCC)    Diabetes mellitus without complication (HCC)    Hyperlipidemia    Hypertension      History reviewed. No pertinent family history.   Social History:  reports that he has quit smoking. He has never used smokeless tobacco. He reports that he does not drink alcohol and does not use drugs.   Exam: Current vital signs: BP (!) 100/53   Pulse (!) 59   Temp 97.8 F (36.6 C) (Axillary)   Resp 15   Ht 5\' 11"  (1.803 m)   Wt 86.1 kg   SpO2 99%   BMI 26.47 kg/m  Vital signs in last 24 hours: Temp:  [94 F (34.4 C)-97.9 F (36.6 C)] 97.8 F (36.6 C) (12/03 0829) Pulse Rate:  [47-63] 59 (12/03 1100) Resp:  [11-21] 15 (12/03 1100) BP: (64-118)/(35-80) 100/53 (12/03 1100) SpO2:  [89 %-100 %] 99 % (12/03 1100) Weight:  [75 kg-86.1 kg] 86.1 kg (12/03 0500)   Physical Exam  Constitutional: Appears well-developed and well-nourished.  Psych: Affect appropriate to situation Eyes: No scleral injection HENT: No OP obstruction MSK: no joint deformities.  Cardiovascular: Normal rate  and regular rhythm.  Respiratory: Effort normal, non-labored breathing GI: Soft.  No distension. There is no tenderness.  Skin: WDI  Neuro: Mental Status: Patient is awake, alert, oriented to person, place, month, year, and situation. Patient is able to give a clear and coherent history. No signs of aphasia or neglect He is dysarthric Cranial Nerves: II: Visual Fields are full. Pupils are equal, round, and reactive to light.   III,IV, VI: EOMI without ptosis or diploplia.  V: Facial sensation is symmetric to temperature VII: Facial movement with?  Left facial weakness VIII: hearing is intact to voice X: Uvula elevates symmetrically XI: Shoulder shrug is symmetric. XII: tongue is midline without atrophy or fasciculations.  Motor: Tone is normal. Bulk is normal. 5/5 strength was present in all four extremities.  He has some multifocal myoclonus and asterixis. Sensory: Sensation is symmetric to light touch and temperature in the arms and legs. Cerebellar: FNF and HKS are intact bilaterally   I have reviewed labs in epic and the results pertinent to this consultation are: BUN 62, creatinine 2.15 INR 2.4   Impression: 87 year old male with what I suspect was convulsive syncope in the setting of severe anemia, GI blood loss, sepsis.  Even if this was a new onset seizure, I would not continue antiepileptics given that this would be a first ever seizure in the setting of likely provoking factors.  He is quite dysarthric, and I would favor getting brain imaging, will order MRI brain.  I suspect that his myoclonus is related to his AKI, but I will also check an ammonia and discontinue gabapentin for now.  Gabapentin could be restarted as his renal function improves.  Recommendations: 1) MRI brain without contrast 2) EEG (likely will not be done until Monday) 3) discontinue Keppra  4) neurology will follow   Roland Rack, MD [facility] [phone]  If 7pm-  7am, please page neurology on call as listed in [place].

## 2021-08-19 NOTE — Evaluation (Signed)
Clinical/Bedside Swallow Evaluation Patient Details  Name: [name] [name] MRN: [id number] Date of Birth: Jun 29, 1928  Today's Date: 08/19/2021 Time: SLP Start Time (ACUTE ONLY): 1322 SLP Stop Time (ACUTE ONLY): 1337 SLP Time Calculation (min) (ACUTE ONLY): 15 min  Past Medical History:  Past Medical History:  Diagnosis Date   Atrial fibrillation (HCC)    Diabetes mellitus without complication (HCC)    Hyperlipidemia    Hypertension    Past Surgical History:  Past Surgical History:  Procedure Laterality Date   APPENDECTOMY     CHOLECYSTECTOMY     TONSILLECTOMY     HPI:  Bibasilar areas of atelectasis and/or consolidation with  superimposed moderate right and small left pleural effusions.  2. Mild cardiomegaly.    Assessment / Plan / Recommendation  Clinical Impression  Pt demonstrates adequate oropharyngeal abilities when consuming thin liquids via straw, puree and graham crackers. Pt's swift appeared swift, hhis voice and vitals remained stable throughout. While silent aspiration cannot be ruled out at bedside, pt's risk appears very low. At this time, recommend a regular diet with thin liquids via straw, medicine whole with thin liquids and general aspiration precautions. No further ST intervention appears indicated at this time. SLP Visit Diagnosis: Dysphagia, unspecified (R13.10)    Aspiration Risk  Mild aspiration risk    Diet Recommendation Regular;Thin liquid   Liquid Administration via: Straw;Cup Medication Administration: Whole meds with liquid Supervision: Patient able to self feed Compensations: Minimize environmental distractions;Slow rate;Small sips/bites Postural Changes: Seated upright at 90 degrees    Other  Recommendations Oral Care Recommendations: Oral care BID    Recommendations for follow up therapy are one component of a multi-disciplinary discharge planning process, led by the attending physician.  Recommendations may be updated based on patient status,  additional functional criteria and insurance authorization.  Follow up Recommendations No SLP follow up      Assistance Recommended at Discharge None  Functional Status Assessment Patient has had a recent decline in their functional status and demonstrates the ability to make significant improvements in function in a reasonable and predictable amount of time.  Frequency and Duration            Prognosis        Swallow Study   General Date of Onset: 08/18/21 HPI: Bibasilar areas of atelectasis and/or consolidation with  superimposed moderate right and small left pleural effusions.  2. Mild cardiomegaly. Type of Study: Bedside Swallow Evaluation Previous Swallow Assessment: none in chart Diet Prior to this Study: NPO Temperature Spikes Noted: No Respiratory Status: Nasal cannula History of Recent Intubation: No Behavior/Cognition: Alert;Cooperative;Pleasant mood Oral Cavity Assessment: Within Functional Limits Oral Care Completed by SLP: Recent completion by staff Oral Cavity - Dentition: Adequate natural dentition Vision: Functional for self-feeding Self-Feeding Abilities: Able to feed self Patient Positioning: Upright in bed Baseline Vocal Quality: Normal Volitional Cough: Strong Volitional Swallow: Able to elicit    Oral/Motor/Sensory Function Overall Oral Motor/Sensory Function: Within functional limits   Ice Chips Ice chips: Not tested   Thin Liquid Thin Liquid: Within functional limits Presentation: Straw;Self Fed    Nectar Thick Nectar Thick Liquid: Not tested   Honey Thick Honey Thick Liquid: Not tested   Puree Puree: Within functional limits Presentation: Self Fed;Spoon   Solid     Solid: Within functional limits Presentation: Self Fed     Kanton Kamel B. Dreama Saa, M.S., CCC-SLP, [facility] Speech-Language Pathologist Rehabilitation Services Office [phone]  Jonne Rote Dreama Saa 08/19/2021,2:57 PM

## 2021-08-19 NOTE — Consult Note (Signed)
Pharmacy Antibiotic Note  [name] [name] is a 87 y.o. male w/ h/o Atrial Fibrillation (on Eliquis), T2DM, HLD, HTN, Acq'd hypothyroidism, PAD, & SSS (s/p PPM) admitted on 08/18/2021 with c/f possible seizure activity (no prior history) and aspiration pneumonia.  Pharmacy has been consulted for Zosyn dosing. - prescribed Avelox for pneumonia, due to suspected aspiration which started 12/2 am per daughter  Plan: Zosyn 3.375g IV q8h (4 hour infusion).  Crcl 23.8 ml/min   Height: 5\' 11"  (180.3 cm) Weight: 86.1 kg (189 lb 13.1 oz) IBW/kg (Calculated) : 75.3  Temp (24hrs), Avg:96.4 F (35.8 C), Min:94 F (34.4 C), Max:97.9 F (36.6 C)  Recent Labs  Lab 08/18/21 1604 08/18/21 1758 08/18/21 2158 08/19/21 0718  WBC 12.5*  --   --  12.0*  CREATININE 2.23*  --   --  2.15*  LATICACIDVEN 2.4* 3.1* 7.7* 1.5     Estimated Creatinine Clearance: 23.8 mL/min (A) (by C-G formula based on SCr of 2.15 mg/dL (H)).    Allergies  Allergen Reactions   Statins Palpitations    Antimicrobials this admission: VAN/CFP/Azith x1 in ED (12/2) Zosyn 12/2 >>   Dose adjustments this admission: CTM closely ISO AKI (Scr 2.23; last known baseline ~0.9-1)  Microbiology results: 12/2 BCx: NG < 24 hrs 12/2 UCx: pending  12/2 Flu/Cov: negative  12/2 MRSA PCR: neg  Thank you for allowing pharmacy to be a part of this patient's care.  [name] [name] A 08/19/2021 10:21 AM

## 2021-08-19 NOTE — Progress Notes (Addendum)
[facility]  - [place] at [facility]   PATIENT NAME: [name] [name]    MR#:  [id number]  DATE OF BIRTH:  [birth year]  SUBJECTIVE:  patient was admitted with possible seizure like activity at [facility] assisted living. Patient is currently awake alert and quite talkative. Denies any symptoms of the some cough. Dryness mouth. No slurring speech noted. No focal weakness. Feels overall week. Awaiting speech to see him. No fever. Patient was found to have hemoglobin of 5.6 and was in septic shock started on IV pressers now weaned off. Received two units of blood transfusion. No seizures reported.  REVIEW OF SYSTEMS:   Review of Systems  Constitutional:  Negative for chills, fever and weight loss.  HENT:  Negative for ear discharge, ear pain and nosebleeds.   Eyes:  Negative for blurred vision, pain and discharge.  Respiratory:  Negative for sputum production, shortness of breath, wheezing and stridor.   Cardiovascular:  Negative for chest pain, palpitations, orthopnea and PND.  Gastrointestinal:  Negative for abdominal pain, diarrhea, nausea and vomiting.  Genitourinary:  Negative for frequency and urgency.  Musculoskeletal:  Positive for joint pain. Negative for back pain.  Neurological:  Positive for weakness. Negative for sensory change, speech change and focal weakness.  Psychiatric/Behavioral:  Negative for depression and hallucinations. The patient is not nervous/anxious.   Tolerating Diet: Tolerating PT: pending  DRUG ALLERGIES:   Allergies  Allergen Reactions   Statins Palpitations    VITALS:  Blood pressure (!) 100/53, pulse (!) 59, temperature 97.8 F (36.6 C), temperature source Axillary, resp. rate 15, height 5\' 11"  (1.803 m), weight 86.1 kg, SpO2 99 %.  PHYSICAL EXAMINATION:   Physical Exam  GENERAL:  87 y.o.-year-old patient lying in the bed with no acute distress. Appears weak and chronically ill HEENT: Head atraumatic, normocephalic. Oropharynx  and nasopharynx clear.  LUNGS: decreased breath sounds bilaterally, no wheezing, rales, rhonchi. No use of accessory muscles of respiration.  CARDIOVASCULAR: S1, S2 normal. No murmurs, rubs, or gallops.  ABDOMEN: Soft, nontender, nondistended. Bowel sounds present. EXTREMITIES: No cyanosis, clubbing or edema b/l.    NEUROLOGIC: nonfocal, gen weakness PSYCHIATRIC:  patient is alert and oriented x 3.  SKIN: No obvious rash, lesion, or ulcer.   LABORATORY PANEL:  CBC Recent Labs  Lab 08/19/21 0718 08/19/21 0951  WBC 12.0*  --   HGB 8.3* 8.2*  HCT 26.4* 26.1*  PLT 180  --     Chemistries  Recent Labs  Lab 08/18/21 1604 08/19/21 0718  NA 134* 133*  K 5.5* 4.6  CL 103 106  CO2 23 20*  GLUCOSE 262* 165*  BUN 62* 62*  CREATININE 2.23* 2.15*  CALCIUM 8.9 8.0*  MG  --  2.1  AST 43*  --   ALT 46*  --   ALKPHOS 81  --   BILITOT 0.9  --    Cardiac Enzymes No results for input(s): TROPONINI in the last 168 hours. RADIOLOGY:  CT ABDOMEN PELVIS WO CONTRAST  Result Date: 08/19/2021 CLINICAL DATA:  Abdominal distension EXAM: CT ABDOMEN AND PELVIS WITHOUT CONTRAST TECHNIQUE: Multidetector CT imaging of the abdomen and pelvis was performed following the standard protocol without IV contrast. COMPARISON:  09/10/2020 FINDINGS: Lower chest: Bilateral pleural effusions are noted right greater than left. Considerable material is noted within the lower lobe bronchial tree with associated patchy areas of consolidation. These changes could represent bilateral aspiration. Coronary calcifications are noted. Hepatobiliary: No focal liver abnormality is seen. Status post cholecystectomy.  No biliary dilatation. Pancreas: Unremarkable. No pancreatic ductal dilatation or surrounding inflammatory changes. Spleen: Normal in size without focal abnormality. Adrenals/Urinary Tract: Adrenal glands are within normal limits bilaterally. Kidneys are well visualized bilaterally without renal calculi or obstructive  changes. The ureters are within normal limits. The bladder is partially distended. Hypodensities are noted within the left kidney likely representing cysts. This is stable from the prior exam. Stomach/Bowel: Circumferential wall thickening is noted within the rectum which may represent some focal proctitis. No perforation is noted. The more proximal colon is within normal limits. The cecum extends inferiorly into a right inguinal hernia along with a loop of terminal ileum. No obstructive changes are seen. The more proximal small bowel is within normal limits with the exception of a small duodenal diverticulum adjacent to the head of pancreas. Stomach is decompressed. Appendix has been surgically removed Vascular/Lymphatic: Aortic atherosclerosis. No enlarged abdominal or pelvic lymph nodes. Reproductive: Prostate is unremarkable. Other: No abdominal wall hernia or abnormality. No abdominopelvic ascites. Musculoskeletal: Degenerative changes of the lumbar spine are noted. IMPRESSION: Right inguinal hernia containing a loop of terminal ileum as well as the cecum. No associated obstruction is noted. Circumferential wall thickening within the rectum which may represent some mild proctitis. Bilateral pleural effusions with associated lower lobe consolidation. Increased soft tissue density is noted within the lower lobe bronchial tree bilaterally which may represent bilateral aspiration. Electronically Signed   By: Alcide Clever M.D.   On: 08/19/2021 00:58   DG Chest Port 1 View  Result Date: 08/18/2021 CLINICAL DATA:  87 year old male with possible sepsis. EXAM: PORTABLE CHEST 1 VIEW COMPARISON:  No priors. FINDINGS: Lung volumes are low. Bibasilar opacities may reflect areas of atelectasis and/or consolidation. Moderate right and small left pleural effusions. No pneumothorax. No evidence of pulmonary edema. Heart size is mildly enlarged. Upper mediastinal contours are within normal limits. Left-sided pacemaker device  in place with lead tips projecting over the expected location of the right atrium and right ventricle. IMPRESSION: 1. Bibasilar areas of atelectasis and/or consolidation with superimposed moderate right and small left pleural effusions. 2. Mild cardiomegaly. Electronically Signed   By: Trudie Reed M.D.   On: 08/18/2021 16:20   ASSESSMENT AND PLAN:  [name] [name] is a 87 y.o. male with a history of atrial fibrillation diabetes hypertension who is brought to the ED from [facility] assisted living facility due to a possible seizure.  He does not have any history of seizures.  He did not fall, but per EMS had an apparent loss of consciousness associated with some involuntary muscle activity.  No recent trauma.  , G.I. bleed due to pneumonia suspect aspiration with symptomatic anemia -- chest x-ray shows by basilar atelectasis versus infiltrate -- CT abdomen pelvis shows left lower lobe consolidation -- continue IV Zosyn -- white count trending down -- seen by speech therapy no evidence of aspiration. Okay to start regular consistency diet with thin liquids--family is concerned about aspiration and wants to do MBS  -- physical therapy pending -- patient has been off IV pressers now -- incentive spirometer and flutter valve  symptomatic anemia secondary to acute on chronic anemia past medical history of a fib on eliquis -- came in with hemoglobin of 5.6--- two unit blood transfusion 8.3 --occult blood positive -- seen by Dr. Mia Creek G.I.-- recommends continue monitoring. -- No active bleeding documented -- IV Protonix -- eliquis on hold  syncope versus possible seizure versus involuntary myoclonic activity -- no history of seizure disorder --  EEG ordered -- CT head pending -- neurology consultation--msg Dr Amada Jupiter -- patient was started on IV Keppra  acute renal failure in the setting of circulatory shock Hyperkalemia -- baseline creatinine 0.9 -- came in with creatinine of 2.23--  2.1 -- avoid nephrotoxic agents -- nephrology consultation if needed --K 5.5--4.0  chronic atrial fibrillation Hyperlipidemia -- eliquis on hold -- will resume Zetia -- will resume diltiazem and blood pressures destabilizes  type II diabetes with neuropathy -- sliding scale insulin -- A1c pending  Hypothyroidism -- continue Synthroid  Procedures: Family communication : dter Boneta Lucks on the phone Consults : G.I., neurology CODE STATUS: DNR DVT Prophylaxis : Level of care: Telemetry Medical Status is: Inpatient  Remains inpatient appropriate because: septic shock, G.I. bleed  transfer to telemetry medical      TOTAL TIME TAKING CARE OF THIS PATIENT: 35 minutes.  >50% time spent on counselling and coordination of care  Note: This dictation was prepared with Dragon dictation along with smaller phrase technology. Any transcriptional errors that result from this process are unintentional.  Enedina Finner M.D    [facility]   CC: Primary care physician; Zenaida Niece, MD Patient ID: Callie Fielding, male   DOB: 16-Feb-1928, 87 y.o.   MRN: [id number]

## 2021-08-20 ENCOUNTER — Inpatient Hospital Stay: Payer: Medicare Other

## 2021-08-20 DIAGNOSIS — R55 Syncope and collapse: Secondary | ICD-10-CM

## 2021-08-20 LAB — URINALYSIS, COMPLETE (UACMP) WITH MICROSCOPIC
RBC / HPF: >50 RBC/hpf — ABNORMAL HIGH (ref 0–5)
Specific Gravity, Urine: 1.026 (ref 1.005–1.030)
WBC, UA: >50 WBC/hpf — ABNORMAL HIGH (ref 0–5)

## 2021-08-20 LAB — GLUCOSE, CAPILLARY
Glucose-Capillary: 134 mg/dL — ABNORMAL HIGH (ref 70–99)
Glucose-Capillary: 126 mg/dL — ABNORMAL HIGH (ref 70–99)
Glucose-Capillary: 224 mg/dL — ABNORMAL HIGH (ref 70–99)
Glucose-Capillary: 154 mg/dL — ABNORMAL HIGH (ref 70–99)

## 2021-08-20 LAB — BASIC METABOLIC PANEL
Anion gap: 6 (ref 5–15)
BUN: 58 mg/dL — ABNORMAL HIGH (ref 8–23)
CO2: 21 mmol/L — ABNORMAL LOW (ref 22–32)
Calcium: 8.3 mg/dL — ABNORMAL LOW (ref 8.9–10.3)
Chloride: 110 mmol/L (ref 98–111)
Creatinine, Ser: 1.75 mg/dL — ABNORMAL HIGH (ref 0.61–1.24)
GFR, Estimated: 36 mL/min — ABNORMAL LOW (ref >60)
Glucose, Bld: 147 mg/dL — ABNORMAL HIGH (ref 70–99)
Potassium: 4.1 mmol/L (ref 3.5–5.1)
Sodium: 137 mmol/L (ref 135–145)

## 2021-08-20 MED ORDER — FUROSEMIDE 10 MG/ML IJ SOLN
20.0000 mg | Freq: Every day | INTRAMUSCULAR | Status: AC
  Administered 2021-08-20 – 2021-08-21 (×2): 20 mg via INTRAVENOUS
  Filled 2021-08-20 (×2): qty 2

## 2021-08-20 MED ORDER — FUROSEMIDE 10 MG/ML IJ SOLN: 20.0000 mg | Freq: Two times a day (BID) | INTRAMUSCULAR | Status: DC

## 2021-08-20 NOTE — Progress Notes (Addendum)
[place] at [place] NAME: [name] [name]    MR#:  [id number]  DATE OF BIRTH:  13-Sep-1928  SUBJECTIVE:  patient was admitted with possible seizure like activity at [facility] assisted living. Patient is currently awake alert and quite talkative. Denies any symptoms of the some cough. Dryness mouth. No slurring speech noted. No focal weakness. Feels overall week. Awaiting speech to see him. No fever.   Patient tells me he feels little tired. He does not feel like eating his breakfast. No family at bedside. Dark-colored urine reported by RN REVIEW OF SYSTEMS:   Review of Systems  Constitutional:  Negative for chills, fever and weight loss.  HENT:  Negative for ear discharge, ear pain and nosebleeds.   Eyes:  Negative for blurred vision, pain and discharge.  Respiratory:  Negative for sputum production, shortness of breath, wheezing and stridor.   Cardiovascular:  Negative for chest pain, palpitations, orthopnea and PND.  Gastrointestinal:  Negative for abdominal pain, diarrhea, nausea and vomiting.  Genitourinary:  Negative for frequency and urgency.  Musculoskeletal:  Positive for joint pain. Negative for back pain.  Neurological:  Positive for weakness. Negative for sensory change, speech change and focal weakness.  Psychiatric/Behavioral:  Negative for depression and hallucinations. The patient is not nervous/anxious.   Tolerating Diet: Tolerating PT: pending  DRUG ALLERGIES:   Allergies  Allergen Reactions   Statins Palpitations    VITALS:  Blood pressure (!) 117/55, pulse (!) 59, temperature 97.9 F (36.6 C), resp. rate 18, height 5\' 11"  (1.803 m), weight 86.1 kg, SpO2 91 %.  PHYSICAL EXAMINATION:   Physical Exam  GENERAL:  87 y.o.-year-old patient lying in the bed with no acute distress. Appears weak and chronically ill HEENT: Head atraumatic, normocephalic. Oropharynx and nasopharynx clear.  LUNGS: decreased breath sounds  bilaterally, no wheezing, rales, rhonchi. No use of accessory muscles of respiration.  CARDIOVASCULAR: S1, S2 normal. No murmurs, rubs, or gallops.  ABDOMEN: Soft, nontender, nondistended. Bowel sounds present.  EXTREMITIES: No cyanosis, clubbing or edema b/l.    NEUROLOGIC: nonfocal, gen weakness PSYCHIATRIC:  patient is alert and awake SKIN: No obvious rash, lesion, or ulcer.   LABORATORY PANEL:  CBC Recent Labs  Lab 08/19/21 0718 08/19/21 0951  WBC 12.0*  --   HGB 8.3* 8.2*  HCT 26.4* 26.1*  PLT 180  --      Chemistries  Recent Labs  Lab 08/18/21 1604 08/19/21 0718 08/20/21 0532  NA 134* 133* 137  K 5.5* 4.6 4.1  CL 103 106 110  CO2 23 20* 21*  GLUCOSE 262* 165* 147*  BUN 62* 62* 58*  CREATININE 2.23* 2.15* 1.75*  CALCIUM 8.9 8.0* 8.3*  MG  --  2.1  --   AST 43*  --   --   ALT 46*  --   --   ALKPHOS 81  --   --   BILITOT 0.9  --   --     Cardiac Enzymes No results for input(s): TROPONINI in the last 168 hours. RADIOLOGY:  CT ABDOMEN PELVIS WO CONTRAST  Result Date: 08/19/2021 CLINICAL DATA:  Abdominal distension EXAM: CT ABDOMEN AND PELVIS WITHOUT CONTRAST TECHNIQUE: Multidetector CT imaging of the abdomen and pelvis was performed following the standard protocol without IV contrast. COMPARISON:  09/10/2020 FINDINGS: Lower chest: Bilateral pleural effusions are noted right greater than left. Considerable material is noted within the lower lobe bronchial tree with associated patchy areas of consolidation. These changes  could represent bilateral aspiration. Coronary calcifications are noted. Hepatobiliary: No focal liver abnormality is seen. Status post cholecystectomy. No biliary dilatation. Pancreas: Unremarkable. No pancreatic ductal dilatation or surrounding inflammatory changes. Spleen: Normal in size without focal abnormality. Adrenals/Urinary Tract: Adrenal glands are within normal limits bilaterally. Kidneys are well visualized bilaterally without renal calculi  or obstructive changes. The ureters are within normal limits. The bladder is partially distended. Hypodensities are noted within the left kidney likely representing cysts. This is stable from the prior exam. Stomach/Bowel: Circumferential wall thickening is noted within the rectum which may represent some focal proctitis. No perforation is noted. The more proximal colon is within normal limits. The cecum extends inferiorly into a right inguinal hernia along with a loop of terminal ileum. No obstructive changes are seen. The more proximal small bowel is within normal limits with the exception of a small duodenal diverticulum adjacent to the head of pancreas. Stomach is decompressed. Appendix has been surgically removed Vascular/Lymphatic: Aortic atherosclerosis. No enlarged abdominal or pelvic lymph nodes. Reproductive: Prostate is unremarkable. Other: No abdominal wall hernia or abnormality. No abdominopelvic ascites. Musculoskeletal: Degenerative changes of the lumbar spine are noted. IMPRESSION: Right inguinal hernia containing a loop of terminal ileum as well as the cecum. No associated obstruction is noted. Circumferential wall thickening within the rectum which may represent some mild proctitis. Bilateral pleural effusions with associated lower lobe consolidation. Increased soft tissue density is noted within the lower lobe bronchial tree bilaterally which may represent bilateral aspiration. Electronically Signed   By: Inez Catalina M.D.   On: 08/19/2021 00:58   CT HEAD WO CONTRAST (5MM)  Result Date: 08/19/2021 CLINICAL DATA:  Syncope, simple, normal neuro exam EXAM: CT HEAD WITHOUT CONTRAST TECHNIQUE: Contiguous axial images were obtained from the base of the skull through the vertex without intravenous contrast. COMPARISON:  None. FINDINGS: Brain: There is no acute intracranial hemorrhage, mass effect, or edema. Gray-white differentiation is preserved. There is no extra-axial fluid collection. Prominence  of the ventricles and sulci reflects parenchymal volume loss. Patchy and confluent hypoattenuation in the supratentorial white matter is nonspecific but probably reflects moderate chronic microvascular ischemic changes. Vascular: There is atherosclerotic calcification at the skull base. Skull: Calvarium is unremarkable. Sinuses/Orbits: Patchy mucosal thickening. Bilateral lens replacements. Other: None. IMPRESSION: No acute intracranial abnormality. Chronic microvascular ischemic changes. Electronically Signed   By: Macy Mis M.D.   On: 08/19/2021 16:29   DG Chest Port 1 View  Result Date: 08/18/2021 CLINICAL DATA:  87 year old male with possible sepsis. EXAM: PORTABLE CHEST 1 VIEW COMPARISON:  No priors. FINDINGS: Lung volumes are low. Bibasilar opacities may reflect areas of atelectasis and/or consolidation. Moderate right and small left pleural effusions. No pneumothorax. No evidence of pulmonary edema. Heart size is mildly enlarged. Upper mediastinal contours are within normal limits. Left-sided pacemaker device in place with lead tips projecting over the expected location of the right atrium and right ventricle. IMPRESSION: 1. Bibasilar areas of atelectasis and/or consolidation with superimposed moderate right and small left pleural effusions. 2. Mild cardiomegaly. Electronically Signed   By: Vinnie Langton M.D.   On: 08/18/2021 16:20   ASSESSMENT AND PLAN:  Deshay Medd is a 87 y.o. male with a history of atrial fibrillation diabetes hypertension who is brought to the ED from [facility] assisted living facility due to a possible seizure.  He does not have any history of seizures.  He did not fall, but per EMS had an apparent loss of consciousness associated with some involuntary  muscle activity.  No recent trauma.  Sepsis in the setting of G.I. bleed/ pneumonia suspect aspiration with symptomatic anemia Acute renal failure/ATN -- chest x-ray shows by basilar atelectasis versus infiltrate --  CT abdomen pelvis shows left lower lobe consolidation -- continue IV Zosyn -- white count trending down -- seen by speech therapy no evidence of aspiration. Okay to start regular consistency diet with thin liquids--family is concerned about aspiration and wants to do MBS  -- physical therapy pending -- patient has been off IV pressers now -- incentive spirometer and flutter valve -lactic acid 7.7--1.5, elevated WBC, Acute renal failure  symptomatic anemia secondary to acute on chronic anemia past medical history of a fib on eliquis -- came in with hemoglobin of 5.6--- two unit blood transfusion 8.3 --occult blood positive -- seen by Dr. Mia Creek G.I.-- recommends continue monitoring. -- No active bleeding documented -- IV Protonix -- eliquis on hold  syncope versus possible seizure versus involuntary myoclonic activity -- no history of seizure disorder -- EEG ordered-- results pending -- CT head pending -- neurology consultation--msg Dr Amada Jupiter -- patient was started on IV Keppra-- now discontinued by neurology since low clinical suspicion for seizure  acute renal failure in the setting of circulatory shock Hyperkalemia -- baseline creatinine 0.9 -- came in with creatinine of 2.23-- 2.1 -- avoid nephrotoxic agents -- nephrology consultation if needed --K 5.5--4.0  chronic atrial fibrillation Hyperlipidemia -- eliquis on hold -- resume Zetia -- will resume diltiazem once blood pressures destabilizes  type II diabetes with neuropathy -- sliding scale insulin -- A1c pending  Hypothyroidism -- continue Synthroid  Procedures: Family communication : dter Boneta Lucks on the phone 12/4 Consults : G.I., neurology CODE STATUS: DNR DVT Prophylaxis : Level of care: Telemetry Medical Status is: Inpatient  Remains inpatient appropriate because: septic shock, G.I. bleed  transfer to telemetry medical      TOTAL TIME TAKING CARE OF THIS PATIENT: 30 minutes.  >50% time  spent on counselling and coordination of care  Note: This dictation was prepared with Dragon dictation along with smaller phrase technology. Any transcriptional errors that result from this process are unintentional.  Enedina Finner M.D    [facility]   CC: Primary care physician; Zenaida Niece, MD Patient ID: Callie Fielding, male   DOB: 1928-07-26, 87 y.o.   MRN: [id number]

## 2021-08-20 NOTE — Progress Notes (Signed)
GI Inpatient Follow-up Note  Subjective:  Patient seen and discussed with nurse with no overt GI bleeding. Hacking up a large amount of mucous. No cbc this morning.  Scheduled Inpatient Medications:   Chlorhexidine Gluconate Cloth  6 each Topical Daily   cholecalciferol   Oral Daily   ezetimibe  10 mg Oral Daily   finasteride  5 mg Oral Daily   insulin aspart  0-5 Units Subcutaneous QHS   insulin aspart  0-9 Units Subcutaneous TID WC   levothyroxine  125 mcg Oral QAC breakfast   multivitamin with minerals  1 tablet Oral Daily   pantoprazole (PROTONIX) IV  40 mg Intravenous Q24H   psyllium  1 packet Oral QPM   tamsulosin  0.4 mg Oral QHS   cyanocobalamin  1,000 mcg Oral Daily    Continuous Inpatient Infusions:    sodium chloride Stopped (08/19/21 0307)   piperacillin-tazobactam (ZOSYN)  IV 3.375 g (08/20/21 0528)    PRN Inpatient Medications:  docusate sodium, polyethylene glycol  Review of Systems:  Review of Systems  Constitutional:  Negative for chills and fever.  HENT:  Positive for hearing loss.   Respiratory:  Positive for sputum production.   Gastrointestinal:  Negative for abdominal pain, blood in stool, constipation, diarrhea, nausea and vomiting.  Musculoskeletal:  Positive for joint pain.  Skin:  Negative for rash.  Neurological:  Negative for focal weakness.  Psychiatric/Behavioral:  Negative for substance abuse.   All other systems reviewed and are negative.    Physical Examination: BP (!) 107/52 (BP Location: Left Arm)   Pulse 61   Temp 97.9 F (36.6 C)   Resp 18   Ht 5\' 11"  (1.803 m)   Wt 86.1 kg   SpO2 91%   BMI 26.47 kg/m  Gen: NAD, alert and oriented x 4 HEENT: PEERLA, EOMI, Neck: supple, no JVD or thyromegaly Chest: No respiratory distress CV: RRR, no m/g/c/r Abd: soft, non-tender, non-distended Ext: no edema, well perfused with 2+ pulses, Skin: no rash or lesions noted Lymph: no lymphadenopathy  Data: Lab Results  Component Value  Date   WBC 12.0 (H) 08/19/2021   HGB 8.2 (L) 08/19/2021   HCT 26.1 (L) 08/19/2021   MCV 77.2 (L) 08/19/2021   PLT 180 08/19/2021   Recent Labs  Lab 08/18/21 1604 08/19/21 0718 08/19/21 0951  HGB 5.6* 8.3* 8.2*   Lab Results  Component Value Date   NA 137 08/20/2021   K 4.1 08/20/2021   CL 110 08/20/2021   CO2 21 (L) 08/20/2021   BUN 58 (H) 08/20/2021   CREATININE 1.75 (H) 08/20/2021   Lab Results  Component Value Date   ALT 46 (H) 08/18/2021   AST 43 (H) 08/18/2021   ALKPHOS 81 08/18/2021   BILITOT 0.9 08/18/2021   Recent Labs  Lab 08/18/21 1604 08/19/21 0718  APTT 42*  --   INR 2.4* 2.4*   Assessment/Plan: 87 y/o gentleman with microcytic anemia without overt GI bleeding here recovering from septic shock secondary to aspiration pneumonia. Given medical comorbidities and no active GI bleeding, will not plan on any invasive procedures at this time  Recommendations:  - monitor daily cbc, transfuse for hemoglobin of < 7 - monitor for overt GI bleeding - maintain active type and screen - treatment of pneumonia per primary team   Please call with any questions or concerns   82 MD, MPH [facility]

## 2021-08-20 NOTE — Progress Notes (Signed)
Neurology will follow up no further seizures, patient states that his bed is very uncomfortable.  He is awake, alert, interactive and appropriate with symmetric strength.  At this point, I think that an MRI would be relatively low yield and he is unable to obtain one here and therefore I think that a CT is adequate.  An EEG would be reasonable, but if this is negative the neurology will have no further recommendations as I strongly suspect this is convulsive syncope in the setting of sepsis, blood loss, and hypotension.  his myoclonus appears greatly improved, I would continue to hold gabapentin for now but can restart this on discharge.  Neurology will follow up EEG results, but if negative we will sign off.  Ritta Slot, MD [facility] [phone]  If 7pm- 7am, please page neurology on call as listed in AMION.

## 2021-08-21 LAB — BASIC METABOLIC PANEL
Anion gap: 6 (ref 5–15)
BUN: 50 mg/dL — ABNORMAL HIGH (ref 8–23)
CO2: 24 mmol/L (ref 22–32)
Calcium: 8.3 mg/dL — ABNORMAL LOW (ref 8.9–10.3)
Chloride: 109 mmol/L (ref 98–111)
Creatinine, Ser: 1.59 mg/dL — ABNORMAL HIGH (ref 0.61–1.24)
GFR, Estimated: 41 mL/min — ABNORMAL LOW (ref >60)
Glucose, Bld: 133 mg/dL — ABNORMAL HIGH (ref 70–99)
Potassium: 3.9 mmol/L (ref 3.5–5.1)
Sodium: 139 mmol/L (ref 135–145)

## 2021-08-21 LAB — CBC
HCT: 26.7 % — ABNORMAL LOW (ref 39.0–52.0)
Hemoglobin: 8.2 g/dL — ABNORMAL LOW (ref 13.0–17.0)
MCH: 23.7 pg — ABNORMAL LOW (ref 26.0–34.0)
MCHC: 30.7 g/dL (ref 30.0–36.0)
MCV: 77.2 fL — ABNORMAL LOW (ref 80.0–100.0)
Platelets: 184 10*3/uL (ref 150–400)
RBC: 3.46 MIL/uL — ABNORMAL LOW (ref 4.22–5.81)
RDW: 19.3 % — ABNORMAL HIGH (ref 11.5–15.5)
WBC: 7.1 10*3/uL (ref 4.0–10.5)
nRBC: 0.4 % — ABNORMAL HIGH (ref 0.0–0.2)

## 2021-08-21 LAB — HEMOGLOBIN A1C
Hgb A1c MFr Bld: 7.1 % — ABNORMAL HIGH (ref 4.8–5.6)
Mean Plasma Glucose: 157 mg/dL

## 2021-08-21 LAB — GLUCOSE, CAPILLARY
Glucose-Capillary: 293 mg/dL — ABNORMAL HIGH (ref 70–99)
Glucose-Capillary: 125 mg/dL — ABNORMAL HIGH (ref 70–99)
Glucose-Capillary: 171 mg/dL — ABNORMAL HIGH (ref 70–99)
Glucose-Capillary: 134 mg/dL — ABNORMAL HIGH (ref 70–99)
Glucose-Capillary: 134 mg/dL — ABNORMAL HIGH (ref 70–99)

## 2021-08-21 MED ORDER — ACETAMINOPHEN 325 MG PO TABS
650.0000 mg | ORAL_TABLET | Freq: Four times a day (QID) | ORAL | Status: DC | PRN
  Administered 2021-08-23 – 2021-08-24 (×2): 650 mg via ORAL
  Filled 2021-08-21 (×2): qty 2

## 2021-08-21 NOTE — Evaluation (Signed)
Physical Therapy Evaluation Patient Details Name: [name] [name] MRN: [id number] DOB: [birth year] Today's Date: 08/21/2021  History of Present Illness  87 yo M presenting to [facility] ED on 08/18/21 via EMS from [facility] with complaints of possible seizure activity. No history of seizures. PMH includes: DM, HTN, urinary incontinence.   Clinical Impression  Pt admitted with above diagnosis. Pt received upright in bed agreeable to PT. Reports being independent at baseline with RW or 4WW with ambulation at [facility] (ALF). Reports being independent with all ADL's/IADL's as well. Pt requires HOB elevated and supervision with increased time to transfer to EOB. Did rely on PT to don socks. With bed elevated and mod VC's for hand placement, pt performed STS to RW and amb 10' in room. Requires increased hip/fnee flexion in swing phase to clear feet due to B foot drop with good success. Pt endorses fatigue and minor dizziness thus seated on other end of bed. MinA to transfer LE's into supine as pt states worsening of symptoms. SPO2 at 91% and HR in mid 90's BPM and reports improvement in symptoms with lying flat. MaxA to scoot up in bed with pillows placed underneath feet to off load. Pt will benefit from STR due to deficits in functional mobility and LE strength/endurance as pt appears far below baseline. Pt currently with functional limitations due to the deficits listed below (see PT Problem List). Pt will benefit from skilled PT to increase their independence and safety with mobility to allow discharge to the venue listed below.      Recommendations for follow up therapy are one component of a multi-disciplinary discharge planning process, led by the attending physician.  Recommendations may be updated based on patient status, additional functional criteria and insurance authorization.  Follow Up Recommendations Skilled nursing-short term rehab (<3 hours/day)    Assistance Recommended at Discharge  Intermittent Supervision/Assistance  Functional Status Assessment Patient has had a recent decline in their functional status and demonstrates the ability to make significant improvements in function in a reasonable and predictable amount of time.  Equipment Recommendations  None recommended by PT    Recommendations for Other Services       Precautions / Restrictions Precautions Precautions: Fall Required Braces or Orthoses:  (pt previously had B braces for B foot drop but states not anymore.) Restrictions Weight Bearing Restrictions: No      Mobility  Bed Mobility Overal bed mobility: Needs Assistance Bed Mobility: Supine to Sit;Sit to Supine     Supine to sit: Supervision;HOB elevated Sit to supine: Min assist   General bed mobility comments: minA at LE's Patient Response: Cooperative  Transfers Overall transfer level: Needs assistance Equipment used: Rolling walker (2 wheels) Transfers: Sit to/from Stand Sit to Stand: Min guard;From elevated surface           General transfer comment: Cuing for safe hand placement on RW    Ambulation/Gait Ambulation/Gait assistance: Min guard Gait Distance (Feet): 10 Feet   Gait Pattern/deviations: Step-through pattern;Decreased dorsiflexion - left;Decreased dorsiflexion - right       General Gait Details: Heavy UE use on RW with increased hip and knee flexion to clear LE's in swing phase due to hx of B foot drop  Stairs            Wheelchair Mobility    Modified Rankin (Stroke Patients Only)       Balance Overall balance assessment: Needs assistance Sitting-balance support: Feet supported;No upper extremity supported Sitting balance-Leahy Scale: Fair  Standing balance-Leahy Scale: Fair Standing balance comment: Relies on UE's on RW                             Pertinent Vitals/Pain Pain Assessment: No/denies pain    Home Living Family/patient expects to be discharged to:: Assisted  living                 Home Equipment: Rolling Walker (2 wheels);Rollator (4 wheels)      Prior Function Prior Level of Function : Independent/Modified Independent             Mobility Comments: Use of RW or 4WW for mobility ADLs Comments: Receives meals but independently performs dressing, bathing and other ADL's. Does not leave community.     Hand Dominance        Extremity/Trunk Assessment   Upper Extremity Assessment Upper Extremity Assessment: Generalized weakness    Lower Extremity Assessment Lower Extremity Assessment: Generalized weakness       Communication   Communication: HOH  Cognition Arousal/Alertness: Awake/alert Behavior During Therapy: WFL for tasks assessed/performed Overall Cognitive Status: Within Functional Limits for tasks assessed                                          General Comments General comments (skin integrity, edema, etc.): SPO2 at 91% post ambulation and HR in mid 90 BPM    Exercises Other Exercises Other Exercises: Role of PT in acute setting, D/c recs, safe use of DME   Assessment/Plan    PT Assessment Patient needs continued PT services  PT Problem List Decreased strength;Decreased activity tolerance;Decreased mobility       PT Treatment Interventions Gait training;DME instruction;Therapeutic exercise;Balance training;Stair training;Neuromuscular re-education;Functional mobility training;Therapeutic activities;Patient/family education    PT Goals (Current goals can be found in the Care Plan section)  Acute Rehab PT Goals Patient Stated Goal: to return to [facility] PT Goal Formulation: With patient Time For Goal Achievement: 09/04/21 Potential to Achieve Goals: Good    Frequency Min 2X/week   Barriers to discharge        Co-evaluation               AM-PAC PT "6 Clicks" Mobility  Outcome Measure Help needed turning from your back to your side while in a flat bed without using  bedrails?: A Little Help needed moving from lying on your back to sitting on the side of a flat bed without using bedrails?: A Little Help needed moving to and from a bed to a chair (including a wheelchair)?: A Little Help needed standing up from a chair using your arms (e.g., wheelchair or bedside chair)?: A Little Help needed to walk in hospital room?: A Little Help needed climbing 3-5 steps with a railing? : A Lot 6 Click Score: 17    End of Session Equipment Utilized During Treatment: Gait belt Activity Tolerance: Patient tolerated treatment well;Patient limited by fatigue Patient left: in bed;with call bell/phone within reach;with bed alarm set Nurse Communication: Mobility status PT Visit Diagnosis: Other abnormalities of gait and mobility (R26.89);Muscle weakness (generalized) (M62.81);Difficulty in walking, not elsewhere classified (R26.2)    Time: 7672-0947 PT Time Calculation (min) (ACUTE ONLY): 22 min   Charges:   PT Evaluation $PT Eval Moderate Complexity: 1 Mod PT Treatments $Therapeutic Exercise: 8-22 mins  Delphia Grates. Fairly IV, PT, DPT Physical Therapist- [facility]  08/21/2021, 4:02 PM

## 2021-08-21 NOTE — TOC Initial Note (Signed)
Transition of Care [facility]) - Initial/Assessment Note    Patient Details  Name: [name] [name] MRN: [id number] Date of Birth: 01-Oct-1927  [facility] of [facility]) CM/SW Contact:    Caryn Section, RN Phone Number: 08/21/2021, 11:54 AM  Clinical Narrative:   Patient was admitted from [facility].  If patient meets re-admission criteria, he is eligible to return to [facility].  Awaiting disposition recommendations.                Expected Discharge Plan:  (TBD) Barriers to Discharge: Continued Medical Work up   Patient Goals and CMS Choice        Expected Discharge Plan and Services Expected Discharge Plan:  (TBD)   Discharge Planning Services: CM Consult Post Acute Care Choice:  (TBD) Living arrangements for the past 2 months: Assisted Living Facility                                      Prior Living Arrangements/Services Living arrangements for the past 2 months: Assisted Living Facility Lives with:: Facility Resident Patient language and need for interpreter reviewed:: Yes (No interpreter required) Do you feel safe going back to the place where you live?: Yes      Need for Family Participation in Patient Care: Yes (Comment) Care giver support system in place?: Yes (comment)   Criminal Activity/Legal Involvement Pertinent to Current Situation/Hospitalization: No - Comment as needed  Activities of Daily Living      Permission Sought/Granted Permission sought to share information with : Case Manager, Magazine features editor Permission granted to share information with : Yes, Verbal Permission Granted              Emotional Assessment         Alcohol / Substance Use: Not Applicable Psych Involvement: No (comment)  Admission diagnosis:  Shock (HCC) [R57.9] Occult GI bleeding [R19.5] Chronic dementia (HCC) [F03.90] Symptomatic anemia [D64.9] Atrial fibrillation, unspecified type (HCC) [I48.91] Type 2 diabetes mellitus with  hyperglycemia, unspecified whether long term insulin use (HCC) [E11.65] Sepsis with acute hypoxic respiratory failure and septic shock, due to unspecified organism (HCC) [A41.9, R65.21, J96.01] Patient Active Problem List   Diagnosis Date Noted   Sepsis with acute hypoxic respiratory failure and septic shock (HCC)    Type 2 diabetes mellitus with hyperglycemia (HCC)    Acute renal failure with tubular necrosis (HCC)    Shock (HCC) 08/18/2021   Symptomatic anemia    Gross hematuria 09/10/2020   Diabetes mellitus without complication (HCC)    Hypertension    PAD (peripheral artery disease) (HCC)    Osteomyelitis, chronic, lower leg, left (HCC)    Hypothyroid    Lung nodule seen on imaging study    PCP:  Zenaida Niece, MD Pharmacy:   [facility] - [place], [place] - [address] [place] [address] Phone: [phone] Fax: [phone]     Social Determinants of Health (SDOH) Interventions    Readmission Risk Interventions No flowsheet data found.

## 2021-08-21 NOTE — Consult Note (Signed)
WOC Nurse Consult Note: Patient receiving care in [facility]. Reason for Consult: "ongoing care of chronic wounds".  When I spoke with his primary nurse, Montel Clock, she told me the only one he has in on the right medial ankle. Wound type: healing wound Pressure Injury POA: Yes/No/NA Measurement: 0.1 cm x 0.1 cm x no depth Wound bed: just a dot Drainage (amount, consistency, odor) none Periwound: intact Dressing procedure/placement/frequency: Today there is a hydrofera blue dressing the patient tells me was placed last week on Thursday. He further tells me nurses come out weekly to perform wound care.  I have entered an order for our bedside staff to place a small piece of Aquacel Advantage over the wound then a small foam dressing on Thursday if the patient remains in hospital.  Also, to order and place bilateral prevalon heel lift boots.  Monitor the wound area(s) for worsening of condition such as: Signs/symptoms of infection,  Increase in size,  Development of or worsening of odor, Development of pain, or increased pain at the affected locations.  Notify the medical team if any of these develop.  Thank you for the consult.  Discussed plan of care with the patient.  WOC nurse will not follow at this time.  Please re-consult the WOC team if needed.  Helmut Muster, RN, MSN, CWOCN, CNS-BC, pager [phone]

## 2021-08-21 NOTE — Progress Notes (Signed)
[facility]  - [place] [facility]   PATIENT NAME: [name] [name]    MR#:  [id number]  DATE OF BIRTH:  1927-10-05  SUBJECTIVE:  patient seen earlier was trying to get IV placed from RN. Did eat good breakfast. Complains of feeling tired. Denies shortness of breath. Currently on room air sats 90 and above. Patient did have dark-colored urine reported by RN yesterday. Waiting urine culture results. Denies any nausea vomiting.  REVIEW OF SYSTEMS:   Review of Systems  Constitutional:  Negative for chills, fever and weight loss.  HENT:  Negative for ear discharge, ear pain and nosebleeds.   Eyes:  Negative for blurred vision, pain and discharge.  Respiratory:  Negative for sputum production, shortness of breath, wheezing and stridor.   Cardiovascular:  Negative for chest pain, palpitations, orthopnea and PND.  Gastrointestinal:  Negative for abdominal pain, diarrhea, nausea and vomiting.  Genitourinary:  Negative for frequency and urgency.  Musculoskeletal:  Positive for joint pain. Negative for back pain.  Neurological:  Positive for weakness. Negative for sensory change, speech change and focal weakness.  Psychiatric/Behavioral:  Negative for depression and hallucinations. The patient is not nervous/anxious.   Tolerating Diet:yes Tolerating PT: pending  DRUG ALLERGIES:   Allergies  Allergen Reactions   Statins Palpitations    VITALS:  Blood pressure (!) 109/51, pulse 61, temperature (!) 97.3 F (36.3 C), temperature source Oral, resp. rate 16, height 5\' 11"  (1.803 m), weight 86.1 kg, SpO2 90 %.  PHYSICAL EXAMINATION:   Physical Exam  GENERAL:  87 y.o.-year-old patient lying in the bed with no acute distress. Appears weak and chronically ill HEENT: Head atraumatic, normocephalic. Oropharynx and nasopharynx clear.  LUNGS: decreased breath sounds bilaterally, no wheezing, rales, rhonchi. No use of accessory muscles of respiration.  CARDIOVASCULAR: S1, S2 normal.  No murmurs, rubs, or gallops.  ABDOMEN: Soft, nontender, nondistended. Bowel sounds present.  EXTREMITIES: No cyanosis, clubbing or edema b/l.    NEUROLOGIC: nonfocal, gen weakness PSYCHIATRIC:  patient is alert and awake SKIN: No obvious rash, lesion, or ulcer.   LABORATORY PANEL:  CBC Recent Labs  Lab 08/21/21 0418  WBC 7.1  HGB 8.2*  HCT 26.7*  PLT 184     Chemistries  Recent Labs  Lab 08/18/21 1604 08/19/21 0718 08/20/21 0532 08/21/21 0418  NA 134* 133*   < > 139  K 5.5* 4.6   < > 3.9  CL 103 106   < > 109  CO2 23 20*   < > 24  GLUCOSE 262* 165*   < > 133*  BUN 62* 62*   < > 50*  CREATININE 2.23* 2.15*   < > 1.59*  CALCIUM 8.9 8.0*   < > 8.3*  MG  --  2.1  --   --   AST 43*  --   --   --   ALT 46*  --   --   --   ALKPHOS 81  --   --   --   BILITOT 0.9  --   --   --    < > = values in this interval not displayed.    Cardiac Enzymes No results for input(s): TROPONINI in the last 168 hours. RADIOLOGY:  CT HEAD WO CONTRAST (14/05/22)  Result Date: 08/19/2021 CLINICAL DATA:  Syncope, simple, normal neuro exam EXAM: CT HEAD WITHOUT CONTRAST TECHNIQUE: Contiguous axial images were obtained from the base of the skull through the vertex without intravenous contrast. COMPARISON:  None.  FINDINGS: Brain: There is no acute intracranial hemorrhage, mass effect, or edema. Gray-white differentiation is preserved. There is no extra-axial fluid collection. Prominence of the ventricles and sulci reflects parenchymal volume loss. Patchy and confluent hypoattenuation in the supratentorial white matter is nonspecific but probably reflects moderate chronic microvascular ischemic changes. Vascular: There is atherosclerotic calcification at the skull base. Skull: Calvarium is unremarkable. Sinuses/Orbits: Patchy mucosal thickening. Bilateral lens replacements. Other: None. IMPRESSION: No acute intracranial abnormality. Chronic microvascular ischemic changes. Electronically Signed   By: Macy Mis M.D.   On: 08/19/2021 16:29   US RENAL  Result Date: 08/20/2021 CLINICAL DATA:  Hematuria EXAM: RENAL / URINARY TRACT ULTRASOUND COMPLETE COMPARISON:  CT abdomen/pelvis dated 08/19/2021 FINDINGS: Right Kidney: Renal measurements: 10.7 x 6.0 x 5.8 cm = volume: 196 mL. Echogenic renal parenchyma. No mass or hydronephrosis visualized. Left Kidney: Renal measurements: 11.2 x 6.8 x 6.5 cm = volume: 257 mL. Echogenic renal parenchyma. 3.0 x 2.3 x 2.5 cm interpolar cyst, simple. No hydronephrosis. Bladder: Layering debris in the bladder. Other: None. IMPRESSION: Layering debris in the bladder. No hydronephrosis. 3.0 cm simple left renal cyst, benign. Electronically Signed   By: Julian Hy M.D.   On: 08/20/2021 20:56   ASSESSMENT AND PLAN:  [name] [name] is a 87 y.o. male with a history of atrial fibrillation diabetes hypertension who is brought to the ED from [facility] assisted living facility due to a possible seizure.  He does not have any history of seizures.  He did not fall, but per EMS had an apparent loss of consciousness associated with some involuntary muscle activity.  No recent trauma.  Sepsis in the setting of G.I. bleed/ pneumonia suspect aspiration with symptomatic anemia Acute renal failure/ATN -- chest x-ray shows by basilar atelectasis versus infiltrate -- CT abdomen pelvis shows left lower lobe consolidation -- continue IV Zosyn -- white count trending down -- seen by speech therapy no evidence of aspiration. Okay to start regular consistency diet with thin liquids--family is concerned about aspiration and wants to do MBS  -- physical therapy pending -- patient has been off IV pressers now -- incentive spirometer and flutter valve -lactic acid 7.7--1.5, elevated WBC, Acute renal failure -- modified barium swallow for tomorrow  symptomatic anemia secondary to acute on chronic anemia past medical history of a fib on eliquis -- came in with hemoglobin of 5.6--- two unit  blood transfusion 8.3 --occult blood positive -- seen by Dr. Haig Prophet G.I.-- recommends continue monitoring. -- No active bleeding documented -- IV Protonix -- eliquis on hold-- patient's daughter Anderson Malta aware -- no active G.I. bleeding reported.  syncope versus possible seizure versus involuntary myoclonic activity -- no history of seizure disorder -- EEG ordered-- results pending -- CT head pending -- neurology consultation--msg Dr Leonel Ramsay -- patient was started on IV Keppra-- now discontinued by neurology since low clinical suspicion for seizure  acute renal failure in the setting of circulatory shock Hyperkalemia -- baseline creatinine 0.9 -- came in with creatinine of 2.23-- 2.1--1.7--1.5 -- avoid nephrotoxic agents -- nephrology consultation if needed --K 5.5--4.0  chronic atrial fibrillation Hyperlipidemia -- eliquis on hold -- resume Zetia -- will resume diltiazem once blood pressures destabilizes  --bp still sof, HR in the 60's--hold diltiazem  type II diabetes with neuropathy -- sliding scale insulin -- A1c 7.1  Hypothyroidism -- continue Synthroid  Procedures:EEG Family communication : dter Sonia Baller at bedside Consults : G.I., neurology CODE STATUS: DNR DVT Prophylaxis : Level of care: Telemetry Medical  Status is: Inpatient  Remains inpatient appropriate because: septic shock, G.I. bleed  TOC for discharge planning, PT OT to see patient.      TOTAL TIME TAKING CARE OF THIS PATIENT: 30 minutes.  >50% time spent on counselling and coordination of care  Note: This dictation was prepared with Dragon dictation along with smaller phrase technology. Any transcriptional errors that result from this process are unintentional.  Fritzi Mandes M.D    [facility]   CC: Primary care physician; Ernesta Amble, MD Patient ID: [name], male   DOB: 06-Jun-1928, 87 y.o.   MRN: [id number]

## 2021-08-21 NOTE — Progress Notes (Signed)
Eeg done 

## 2021-08-22 ENCOUNTER — Inpatient Hospital Stay: Payer: Medicare Other

## 2021-08-22 LAB — GLUCOSE, CAPILLARY
Glucose-Capillary: 116 mg/dL — ABNORMAL HIGH (ref 70–99)
Glucose-Capillary: 216 mg/dL — ABNORMAL HIGH (ref 70–99)
Glucose-Capillary: 132 mg/dL — ABNORMAL HIGH (ref 70–99)
Glucose-Capillary: 161 mg/dL — ABNORMAL HIGH (ref 70–99)

## 2021-08-22 LAB — URINE CULTURE: Culture: NO GROWTH

## 2021-08-22 MED ORDER — GUAIFENESIN 100 MG/5ML PO LIQD: 5.0000 mL | ORAL | Status: DC | PRN

## 2021-08-22 MED ORDER — PANTOPRAZOLE SODIUM 40 MG PO TBEC
40.0000 mg | DELAYED_RELEASE_TABLET | Freq: Every day | ORAL | Status: DC
  Administered 2021-08-22 – 2021-08-24 (×3): 40 mg via ORAL
  Filled 2021-08-22 (×3): qty 1

## 2021-08-22 MED ORDER — AMOXICILLIN-POT CLAVULANATE 400-57 MG/5ML PO SUSR
875.0000 mg | Freq: Two times a day (BID) | ORAL | Status: DC
  Administered 2021-08-22 – 2021-08-24 (×5): 875 mg via ORAL
  Filled 2021-08-22 (×2): qty 10.9
  Filled 2021-08-22: qty 10.94
  Filled 2021-08-22 (×2): qty 10.9
  Filled 2021-08-22 (×2): qty 10.94

## 2021-08-22 NOTE — TOC Progression Note (Addendum)
Transition of Care [facility]) - Progression Note    Patient Details  Name: Cephas Revard MRN: [id number] Date of Birth: 08/19/1928  [facility]) CM/SW Contact  Caryn Section, RN Phone Number: 08/22/2021, 4:37 PM  Clinical Narrative:   Daughter states that she and brother are amenable to SNF for rehabilitation.  Family would like [facility] in [place], [place] or [facility] in [place], [place] if possible.  RNCM explained that we cannot guarantee a bed in a particular facility.  SNF bed search sent.  TOC contact information provided.  TOC to follow until discharge.  Addendum [id number] PASRR Pending will need to contact via phone    Expected Discharge Plan: Skilled Nursing Facility Barriers to Discharge: Continued Medical Work up  Expected Discharge Plan and Services Expected Discharge Plan: Skilled Nursing Facility   Discharge Planning Services: CM Consult Post Acute Care Choice: Skilled Nursing Facility Living arrangements for the past 2 months: Assisted Living Facility                                       Social Determinants of Health (SDOH) Interventions    Readmission Risk Interventions No flowsheet data found.

## 2021-08-22 NOTE — [place] FL2 (Signed)
[place] MEDICAID FL2 LEVEL OF CARE SCREENING TOOL     IDENTIFICATION  Patient Name: [name] [name] Birthdate: 1928/02/24 Sex: male Admission Date (Current Location): 08/18/2021  [place] and [place] Number:  Chiropodist and Address:  [facility], [address] [place], [place] [address]      Provider Number: [id number]  Attending Physician Name and Address:  Enedina Finner, MD  Relative Name and Phone Number:  [name] (Daughter)   [phone] (Mobile)    Current Level of Care: Hospital Recommended Level of Care: Skilled Nursing Facility Prior Approval Number:    Date Approved/Denied:   PASRR Number: pending  Discharge Plan: SNF    Current Diagnoses: Patient Active Problem List   Diagnosis Date Noted   Sepsis with acute hypoxic respiratory failure and septic shock (HCC)    Type 2 diabetes mellitus with hyperglycemia (HCC)    Acute renal failure with tubular necrosis (HCC)    Shock (HCC) 08/18/2021   Symptomatic anemia    Gross hematuria 09/10/2020   Diabetes mellitus without complication (HCC)    Hypertension    PAD (peripheral artery disease) (HCC)    Osteomyelitis, chronic, lower leg, left (HCC)    Hypothyroid    Lung nodule seen on imaging study     Orientation RESPIRATION BLADDER Height & Weight     Self  Normal Incontinent Weight: 86.1 kg Height:  5\' 11"  (180.3 cm)  BEHAVIORAL SYMPTOMS/MOOD NEUROLOGICAL BOWEL NUTRITION STATUS        Diet (Heart)  AMBULATORY STATUS COMMUNICATION OF NEEDS Skin   Limited Assist Verbally Normal, Bruising (bilateral lower legs)                       Personal Care Assistance Level of Assistance  Bathing, Feeding, Dressing Bathing Assistance: Limited assistance Feeding assistance: Limited assistance Dressing Assistance: Limited assistance     Functional Limitations Info  Sight, Hearing, Speech Sight Info: Impaired Hearing Info: Impaired Speech Info: Adequate    SPECIAL  CARE FACTORS FREQUENCY  PT (By licensed PT), OT (By licensed OT)   Diabetic Urine Testing Frequency: na PT Frequency: 5x weely OT Frequency: 5x weekly            Contractures Contractures Info: Not present    Additional Factors Info  Code Status, Allergies Code Status Info: DNR Allergies Info: Statins           Current Medications (08/22/2021):  This is the current hospital active medication list Current Facility-Administered Medications  Medication Dose Route Frequency Provider Last Rate Last Admin   0.9 %  sodium chloride infusion  250 mL Intravenous Continuous 14/02/2021, MD   Stopped at 08/21/21 1449   acetaminophen (TYLENOL) tablet 650 mg  650 mg Oral Q6H PRN 14/05/22, MD       amoxicillin-clavulanate (AUGMENTIN) 400-57 MG/5ML suspension 875 mg  875 mg Oral Q12H Enedina Finner, MD   875 mg at 08/22/21 1244   cholecalciferol (VITAMIN D) tablet   Oral Daily 14/06/22, MD   1,000 Units at 08/22/21 1037   docusate sodium (COLACE) capsule 100 mg  100 mg Oral BID PRN 14/06/22, MD       ezetimibe (ZETIA) tablet 10 mg  10 mg Oral Daily Erin Fulling, MD   10 mg at 08/22/21 1038   finasteride (PROSCAR) tablet 5 mg  5 mg Oral Daily 14/06/22, MD   5 mg at 08/22/21 1037   guaiFENesin (ROBITUSSIN) 100 MG/5ML liquid  5 mL  5 mL Oral Q4H PRN Rise Patience, MD       insulin aspart (novoLOG) injection 0-5 Units  0-5 Units Subcutaneous QHS Fritzi Mandes, MD   2 Units at 08/19/21 2142   insulin aspart (novoLOG) injection 0-9 Units  0-9 Units Subcutaneous TID WC Fritzi Mandes, MD   3 Units at 08/22/21 1234   levothyroxine (SYNTHROID) tablet 125 mcg  125 mcg Oral QAC breakfast Fritzi Mandes, MD   125 mcg at 08/22/21 1037   multivitamin with minerals tablet 1 tablet  1 tablet Oral Daily Fritzi Mandes, MD   1 tablet at 08/22/21 1037   pantoprazole (PROTONIX) EC tablet 40 mg  40 mg Oral Daily Fritzi Mandes, MD       polyethylene glycol (MIRALAX / GLYCOLAX) packet 17 g  17 g Oral Daily PRN  Flora Lipps, MD       psyllium (HYDROCIL/METAMUCIL) 1 packet  1 packet Oral QPM Fritzi Mandes, MD   1 packet at 08/21/21 1734   tamsulosin (FLOMAX) capsule 0.4 mg  0.4 mg Oral QHS Fritzi Mandes, MD   0.4 mg at 08/21/21 2007   vitamin B-12 (CYANOCOBALAMIN) tablet 1,000 mcg  1,000 mcg Oral Daily Fritzi Mandes, MD   1,000 mcg at 08/22/21 1037     Discharge Medications: Please see discharge summary for a list of discharge medications.  Relevant Imaging Results:  Relevant Lab Results:   Additional Information SSN [id number]  Pete Pelt, RN

## 2021-08-22 NOTE — Progress Notes (Signed)
Physical Therapy Treatment Patient Details Name: [name] [name] MRN: [id number] DOB: Nov 01, 1927 Today's Date: 08/22/2021   History of Present Illness 87 yo M presenting to [facility] ED on 08/18/21 via EMS from [facility] with complaints of possible seizure activity. No history of seizures. PMH includes: DM, HTN, urinary incontinence.    PT Comments    Pt received in bed agreeable to PT. Able to transition from supine to seated with supervision. Able to stand to RW with minguard and bouts of momentum to stand with cuing for hand placement with fair carryover. Tolerated ambulating 12' with RW with mild unsteadiness but no LOB before reporting onset of lightheaded and dizziness with fatigue but not as bad as yesterday with PT during initial eval. Chair follow was provided and pt seated in recliner. SPO2 at 97% and HR at 60 BPM. Pt returned to within room and performed orthostatics in sitting then standing. Then transferred back to bed. Pt endorsing SOB and fatigue post session. Attending RN aware and attending MD informed of BP readings. Pt remains limited in progression of safe functional mobility due to symptoms leading to continued recommendation of STR at this time to improve safe tolerance for standing and walking to perform ADL's independently prior return to [facility] unit.   Orthostatic VS for the past 24 hrs:  BP- Sitting Pulse- Sitting BP- Standing at 0 minutes Pulse- Standing at 0 minutes  08/22/21 1542 132/59 60 118/53 60     Recommendations for follow up therapy are one component of a multi-disciplinary discharge planning process, led by the attending physician.  Recommendations may be updated based on patient status, additional functional criteria and insurance authorization.  Follow Up Recommendations  Skilled nursing-short term rehab (<3 hours/day)     Assistance Recommended at Discharge Intermittent Supervision/Assistance  Equipment Recommendations  None recommended by PT     Recommendations for Other Services       Precautions / Restrictions Precautions Precautions: Fall Restrictions Weight Bearing Restrictions: No     Mobility  Bed Mobility Overal bed mobility: Needs Assistance Bed Mobility: Supine to Sit;Sit to Supine     Supine to sit: Supervision;HOB elevated Sit to supine: Supervision   General bed mobility comments: ABle to return LE's into bed and scoot up in bed independently. Patient Response: Cooperative  Transfers Overall transfer level: Needs assistance Equipment used: Rolling walker (2 wheels) Transfers: Sit to/from Stand Sit to Stand: Min guard           General transfer comment: MIN A from std height bed, VC for hand placement    Ambulation/Gait Ambulation/Gait assistance: Min guard Gait Distance (Feet): 12 Feet Assistive device: Rolling walker (2 wheels) Gait Pattern/deviations: Step-through pattern;Decreased dorsiflexion - left;Decreased dorsiflexion - right       General Gait Details: Heavy UE use on RW with increased hip and knee flexion to clear LE's in swing phase due to hx of B foot drop   Stairs             Wheelchair Mobility    Modified Rankin (Stroke Patients Only)       Balance Overall balance assessment: Needs assistance Sitting-balance support: Feet supported;No upper extremity supported Sitting balance-Leahy Scale: Fair     Standing balance support: Reliant on assistive device for balance;Bilateral upper extremity supported;During functional activity Standing balance-Leahy Scale: Fair Standing balance comment: Does rely on UE's on RW for support.  Cognition Arousal/Alertness: Awake/alert Behavior During Therapy: WFL for tasks assessed/performed Overall Cognitive Status: No family/caregiver present to determine baseline cognitive functioning                                 General Comments: Alert, oriented, follows commands with  VC, requires VC for safety/sequencing for ADL transfers to minimize LOB, appears to demo mild impairments in problem solving/safety        Exercises Other Exercises Other Exercises: Benefits of safe, graded mobility for strength Other Exercises: Pt instructed in ADL transfers, RW mgt, falls prevention    General Comments General comments (skin integrity, edema, etc.): SPo2 at 97%, HR 59-60 BPM      Pertinent Vitals/Pain Pain Assessment: No/denies pain    Home Living Family/patient expects to be discharged to:: Assisted living                 Home Equipment: Agricultural consultant (2 wheels);Rollator (4 wheels)      Prior Function            PT Goals (current goals can now be found in the care plan section) Acute Rehab PT Goals Patient Stated Goal: to return to [facility] PT Goal Formulation: With patient Time For Goal Achievement: 09/04/21 Potential to Achieve Goals: Good Progress towards PT goals: Progressing toward goals    Frequency    Min 2X/week      PT Plan Current plan remains appropriate    Co-evaluation              AM-PAC PT "6 Clicks" Mobility   Outcome Measure  Help needed turning from your back to your side while in a flat bed without using bedrails?: A Little Help needed moving from lying on your back to sitting on the side of a flat bed without using bedrails?: A Little Help needed moving to and from a bed to a chair (including a wheelchair)?: A Little Help needed standing up from a chair using your arms (e.g., wheelchair or bedside chair)?: A Little Help needed to walk in hospital room?: A Little Help needed climbing 3-5 steps with a railing? : A Lot 6 Click Score: 17    End of Session Equipment Utilized During Treatment: Gait belt Activity Tolerance: Patient tolerated treatment well;Patient limited by fatigue Patient left: in bed;with call bell/phone within reach;with bed alarm set Nurse Communication: Mobility status PT Visit  Diagnosis: Other abnormalities of gait and mobility (R26.89);Muscle weakness (generalized) (M62.81);Difficulty in walking, not elsewhere classified (R26.2)     Time: 0630-1601 PT Time Calculation (min) (ACUTE ONLY): 35 min  Charges:  $Therapeutic Activity: 23-37 mins                    [name] [name] IV, PT, DPT Physical Therapist- [facility]     08/22/2021, 4:02 PM

## 2021-08-22 NOTE — Progress Notes (Signed)
[place] [facility] [place] NAME: [name] [name]    MR#:  [id number]  DATE OF BIRTH:  [birth year]  SUBJECTIVE:  patient sitting in the recliner this morning. He feels a lot better. Tells me he is urinated a lot and is drinking enough water. Eating much better. Cough has improved.  REVIEW OF SYSTEMS:   Review of Systems  Constitutional:  Negative for chills, fever and weight loss.  HENT:  Negative for ear discharge, ear pain and nosebleeds.   Eyes:  Negative for blurred vision, pain and discharge.  Respiratory:  Negative for sputum production, shortness of breath, wheezing and stridor.   Cardiovascular:  Negative for chest pain, palpitations, orthopnea and PND.  Gastrointestinal:  Negative for abdominal pain, diarrhea, nausea and vomiting.  Genitourinary:  Negative for frequency and urgency.  Musculoskeletal:  Positive for joint pain. Negative for back pain.  Neurological:  Positive for weakness. Negative for sensory change, speech change and focal weakness.  Psychiatric/Behavioral:  Negative for depression and hallucinations. The patient is not nervous/anxious.   Tolerating Diet:yes Tolerating PT: recommends rehab  DRUG ALLERGIES:   Allergies  Allergen Reactions   Statins Palpitations    VITALS:  Blood pressure (!) 114/52, pulse 61, temperature 97.7 F (36.5 C), temperature source Oral, resp. rate 19, height 5\' 11"  (1.803 m), weight 86.1 kg, SpO2 95 %.  PHYSICAL EXAMINATION:   Physical Exam  GENERAL:  87 y.o.-year-old patient lying in the bed with no acute distress. Appears weak and chronically ill HEENT: Head atraumatic, normocephalic. Oropharynx and nasopharynx clear. Pallor+ LUNGS: decreased breath sounds bilaterally, no wheezing, rales, rhonchi. No use of accessory muscles of respiration.  CARDIOVASCULAR: S1, S2 normal. No murmurs, rubs, or gallops.  ABDOMEN: Soft, nontender, nondistended. Bowel sounds present.  EXTREMITIES: No  cyanosis, clubbing  +edema b/l.    NEUROLOGIC: nonfocal, gen weakness PSYCHIATRIC:  patient is alert and awake SKIN: No obvious rash, lesion, or ulcer.   LABORATORY PANEL:  CBC Recent Labs  Lab 08/21/21 0418  WBC 7.1  HGB 8.2*  HCT 26.7*  PLT 184     Chemistries  Recent Labs  Lab 08/18/21 1604 08/19/21 0718 08/20/21 0532 08/21/21 0418  NA 134* 133*   < > 139  K 5.5* 4.6   < > 3.9  CL 103 106   < > 109  CO2 23 20*   < > 24  GLUCOSE 262* 165*   < > 133*  BUN 62* 62*   < > 50*  CREATININE 2.23* 2.15*   < > 1.59*  CALCIUM 8.9 8.0*   < > 8.3*  MG  --  2.1  --   --   AST 43*  --   --   --   ALT 46*  --   --   --   ALKPHOS 81  --   --   --   BILITOT 0.9  --   --   --    < > = values in this interval not displayed.    Cardiac Enzymes No results for input(s): TROPONINI in the last 168 hours. RADIOLOGY:  US RENAL  Result Date: 08/20/2021 CLINICAL DATA:  Hematuria EXAM: RENAL / URINARY TRACT ULTRASOUND COMPLETE COMPARISON:  CT abdomen/pelvis dated 08/19/2021 FINDINGS: Right Kidney: Renal measurements: 10.7 x 6.0 x 5.8 cm = volume: 196 mL. Echogenic renal parenchyma. No mass or hydronephrosis visualized. Left Kidney: Renal measurements: 11.2 x 6.8 x 6.5 cm = volume: 257 mL. Echogenic  renal parenchyma. 3.0 x 2.3 x 2.5 cm interpolar cyst, simple. No hydronephrosis. Bladder: Layering debris in the bladder. Other: None. IMPRESSION: Layering debris in the bladder. No hydronephrosis. 3.0 cm simple left renal cyst, benign. Electronically Signed   By: [name] [name] M.D.   On: 08/20/2021 20:56   ASSESSMENT AND PLAN:  [name] [name] is a 87 y.o. male with a history of atrial fibrillation diabetes hypertension who is brought to the ED from [facility] assisted living facility due to a possible seizure.  He does not have any history of seizures.  He did not fall, but per EMS had an apparent loss of consciousness associated with some involuntary muscle activity.  No recent trauma.  Sepsis  in the setting of G.I. bleed/ pneumonia suspect aspiration with symptomatic anemia Acute renal failure/ATN -- chest x-ray shows by basilar atelectasis versus infiltrate -- CT abdomen pelvis shows left lower lobe consolidation -- continue IV Zosyn -- white count trending down -- seen by speech therapy no evidence of aspiration. Okay to start regular consistency diet with thin liquids--family is concerned about aspiration and wants to do MBS  -- physical therapy pending -- patient has been off IV pressers now -- incentive spirometer and flutter valve -lactic acid 7.7--1.5, elevated WBC, Acute renal failure -- modified barium swallow for tomorrow --12/6-- patient underwent modified barium swallow. Per speech therapist oropharyngeal phase appears deconditioned and trace aspiration was noted. Speech therapy recommends follow-up therapy at the facility. Patient is at a high risk for aspiration. Currently continue regular diet within liquid. -- Change to oral Augmentin -- urine culture negative  symptomatic anemia secondary to acute on chronic anemia past medical history of a fib on eliquis -- came in with hemoglobin of 5.6--- two unit blood transfusion 8.3-- 8.2 --occult blood positive -- seen by Dr. Mia [name] G.I.-- recommends continue monitoring. -- No active bleeding documented -- IV Protonix-- change to oral -- eliquis on hold-- patient's daughter [name] [name] aware -- no active G.I. bleeding reported.  syncope versus possible seizure versus involuntary myoclonic activity -- no history of seizure disorder -- EEG ordered-- results pending -- CT head pending -- neurology consultation--msg Dr [name] [name] -- patient was started on IV Keppra-- now discontinued by neurology since low clinical suspicion for seizure --12/6-- negative per Dr. Selina [name]  acute renal failure in the setting of circulatory shock Hyperkalemia -- baseline creatinine 0.9 -- came in with creatinine of 2.23--  2.1--1.7--1.5 -- avoid nephrotoxic agents -- nephrology consultation if needed --K 5.5--4.0  chronic atrial fibrillation Hyperlipidemia -- eliquis on hold -- resume Zetia -- will resume diltiazem once blood pressures destabilizes  --bp still soft HR in the 60's--hold diltiazem  type II diabetes with neuropathy -- sliding scale insulin -- A1c 7.1  Hypothyroidism -- continue Synthroid  Procedures:EEG Family communication : dter Boneta Lucks at bedside Consults : G.I., neurology CODE STATUS: DNR DVT Prophylaxis : Level of care: Telemetry Medical Status is: Inpatient  Remains inpatient appropriate because: septic shock, G.I. bleed  TOC for discharge planning, PT OT to see patient.      TOTAL TIME TAKING CARE OF THIS PATIENT: 30 minutes.  >50% time spent on counselling and coordination of care  Note: This dictation was prepared with Dragon dictation along with smaller phrase technology. Any transcriptional errors that result from this process are unintentional.  Enedina Finner M.D    [facility]   CC: Primary care physician; Zenaida Niece, MD Patient ID: Callie Fielding, male   DOB: March 26, 1928, 87 y.o.  MRN: [id number]

## 2021-08-22 NOTE — Procedures (Signed)
Routine EEG Report  [name] [name] is a 87 y.o. male with a history of spells who is undergoing an EEG to evaluate for seizures.  Report: This EEG was acquired with electrodes placed according to the International 10-20 electrode system (including Fp1, Fp2, F3, F4, C3, C4, P3, P4, O1, O2, T3, T4, T5, T6, A1, A2, Fz, Cz, Pz). The following electrodes were missing or displaced: none.  The occipital dominant rhythm was 8.5 Hz. This activity is reactive to stimulation. Drowsiness was manifested by background fragmentation; deeper stages of sleep were identified by K complexes and sleep spindles. There was no focal slowing. There were no interictal epileptiform discharges. There were no electrographic seizures identified. There was no abnormal response to photic stimulation or hyperventilation.   Impression: This EEG was obtained while awake and asleep and is normal.    Clinical Correlation: Normal EEGs, however, do not rule out epilepsy.  Bing Neighbors, MD [facility] [phone]  If 7pm- 7am, please page neurology on call as listed in AMION.

## 2021-08-22 NOTE — Evaluation (Signed)
Objective Swallowing Evaluation: Type of Study: MBS-Modified Barium Swallow Study   Patient Details  Name: [name] [name] MRN: [id number] Date of Birth: 1927/09/29  Today's Date: 08/22/2021 Time: SLP Start Time (ACUTE ONLY): 49 -SLP Stop Time (ACUTE ONLY): 1245  SLP Time Calculation (min) (ACUTE ONLY): 75 min   Past Medical History:  Past Medical History:  Diagnosis Date   Atrial fibrillation ([place])    Diabetes mellitus without complication ([place])    Hyperlipidemia    Hypertension    Past Surgical History:  Past Surgical History:  Procedure Laterality Date   APPENDECTOMY     CHOLECYSTECTOMY     TONSILLECTOMY     HPI: Bibasilar areas of atelectasis and/or consolidation with  superimposed moderate right and small left pleural effusions.  2. Mild cardiomegaly.   Subjective: pt pleasant, talkative - mild distraction but easily redirected. min HOH also.    Recommendations for follow up therapy are one component of a multi-disciplinary discharge planning process, led by the attending physician.  Recommendations may be updated based on patient status, additional functional criteria and insurance authorization.  Assessment / Plan / Recommendation  Clinical Impressions 08/22/2021  Clinical Impression Pt appears to present w/ Mild-Moderate oropharyngeal phase Dsyphagia w/ risk for aspiration, moreso of pharyngeal residue post swallow. Trace-min laryngeal penetration was noted from bolus residue coating the underneath side of the Epiglottis during/post swallows; this was followed by trace-min aspiration b/t trials given -- suspect aspiration of the laryngeal penetration residue b/t trials. Pt was insensate to this and did not immediately cough in response. Moderate pharygneal residue was noted post initial swallow from oral-BOT residual filling the Valleculae and Pyriform Sinuses post swallow. Compensatory swallowing strategies appeared to aid reducing this pharyngeal residue, thus reducing risk  for Penetration/Aspiration of this residue.  SLP Visit Diagnosis Dysphagia, oropharyngeal phase (R13.12)  Attention and concentration deficit following --  Frontal lobe and executive function deficit following --  Impact on safety and function Mild aspiration risk      Treatment Recommendations 08/22/2021  Treatment Recommendations Defer treatment plan to f/u with SLP     Prognosis 08/22/2021  Prognosis for Safe Diet Advancement Fair  Barriers to Reach Goals Time post onset;Motivation;Severity of deficits;Behavior  Barriers/Prognosis Comment --    Diet Recommendations 08/22/2021  SLP Diet Recommendations Regular solids;Thin liquid  Liquid Administration via Cup;No straw  Medication Administration Whole meds with puree  Compensations Minimize environmental distractions;Slow rate;Small sips/bites;Lingual sweep for clearance of pocketing;Multiple dry swallows after each bite/sip;Clear throat intermittently;Hard cough after swallow  Postural Changes Remain semi-upright after after feeds/meals (Comment);Seated upright at 90 degrees      Other Recommendations 08/22/2021  Recommended Consults (No Data)  Oral Care Recommendations Oral care BID;Oral care before and after PO;Patient independent with oral care  Other Recommendations (No Data)  Follow Up Recommendations Skilled nursing-short term rehab (<3 hours/day)  Assistance recommended at discharge Intermittent Supervision/Assistance  Functional Status Assessment Patient has had a recent decline in their functional status and/or demonstrates limited ability to make significant improvements in function in a reasonable and predictable amount of time    Frequency and Duration  08/22/2021  Speech Therapy Frequency (ACUTE ONLY) (No Data)  Treatment Duration (No Data)      Oral Phase 08/22/2021  Oral Phase Impaired  Oral - Pudding Teaspoon --  Oral - Pudding Cup --  Oral - Honey Teaspoon --  Oral - Honey Cup --  Oral - Nectar Teaspoon --   Oral - Nectar Cup --  Oral -  Nectar Straw --  Oral - Thin Teaspoon --  Oral - Thin Cup --  Oral - Thin Straw --  Oral - Puree --  Oral - Mech Soft --  Oral - Regular --  Oral - Multi-Consistency --  Oral - Pill --  Oral Phase - Comment --    Pharyngeal Phase 08/22/2021  Pharyngeal Phase Impaired  Pharyngeal- Pudding Teaspoon --  Pharyngeal --  Pharyngeal- Pudding Cup --  Pharyngeal --  Pharyngeal- Honey Teaspoon --  Pharyngeal --  Pharyngeal- Honey Cup --  Pharyngeal --  Pharyngeal- Nectar Teaspoon --  Pharyngeal --  Pharyngeal- Nectar Cup --  Pharyngeal --  Pharyngeal- Nectar Straw --  Pharyngeal --  Pharyngeal- Thin Teaspoon --  Pharyngeal --  Pharyngeal- Thin Cup --  Pharyngeal --  Pharyngeal- Thin Straw --  Pharyngeal --  Pharyngeal- Puree --  Pharyngeal --  Pharyngeal- Mechanical Soft --  Pharyngeal --  Pharyngeal- Regular --  Pharyngeal --  Pharyngeal- Multi-consistency --  Pharyngeal --  Pharyngeal- Pill --  Pharyngeal --  Pharyngeal Comment --     Cervical Esophageal Phase  08/22/2021  Cervical Esophageal Phase Impaired  Pudding Teaspoon --  Pudding Cup --  Honey Teaspoon --  Honey Cup --  Nectar Teaspoon --  Nectar Cup --  Nectar Straw --  Thin Teaspoon --  Thin Cup --  Thin Straw --  Puree --  Mechanical Soft --  Regular --  Multi-consistency --  Pill --  Cervical Esophageal Comment prominent PE segement w/ trace+ bolus stasis in the cervcial esophagus below the PE segment. Also noted a tissue protrusion in the area of the UES from posterior pharyngeal wall(?). Per Radiologist, there was uncertainty if whether a deverticulum or not and recommended f/u w/ Nect CT for further assessment. This was relayed to Attending MD.             Jerilynn Som, MS, CCC-SLP Speech Language Pathologist Rehab Services [phone] [facility] 08/22/2021, 7:00 PM

## 2021-08-22 NOTE — Evaluation (Signed)
Occupational Therapy Evaluation Patient Details Name: [name] [name] MRN: [id number] DOB: March 13, 1928 Today's Date: 08/22/2021   History of Present Illness 87 yo M presenting to [facility] ED on 08/18/21 via EMS from [facility] with complaints of possible seizure activity. No history of seizures. PMH includes: DM, HTN, urinary incontinence.   Clinical Impression   Pt was seen for OT evaluation this date. Prior to hospital admission, pt was using a walker at his ALF, taking sink baths, and dressing himself. Currently pt demonstrates impairments as described below (See OT problem list) which functionally limit his ability to perform ADL/self-care tasks. Pt requires MIN A for ADL transfers +VC for hand placement from std height surfaces. He was able to manage socks from a seated position using a figure 4 technique. In standing, pt able to perform pericare, but did require MIN A for thoroughness. Pt endorsed fatigue with limited exertion. HR in 50's, SpO2 90-91% on room air. Pt would benefit from skilled OT services to address noted impairments and functional limitations (see below for any additional details) in order to maximize safety and independence while minimizing falls risk and caregiver burden. Upon hospital discharge, recommend STR to maximize pt safety and return to PLOF.    Recommendations for follow up therapy are one component of a multi-disciplinary discharge planning process, led by the attending physician.  Recommendations may be updated based on patient status, additional functional criteria and insurance authorization.   Follow Up Recommendations  Skilled nursing-short term rehab (<3 hours/day)    Assistance Recommended at Discharge Intermittent Supervision/Assistance  Functional Status Assessment  Patient has had a recent decline in their functional status and demonstrates the ability to make significant improvements in function in a reasonable and predictable amount of time.  Equipment  Recommendations  BSC/3in1    Recommendations for Other Services       Precautions / Restrictions Precautions Precautions: Fall Restrictions Weight Bearing Restrictions: No      Mobility Bed Mobility Overal bed mobility: Needs Assistance Bed Mobility: Supine to Sit     Supine to sit: Supervision;HOB elevated          Transfers Overall transfer level: Needs assistance Equipment used: Rolling walker (2 wheels) Transfers: Sit to/from Stand Sit to Stand: Min assist           General transfer comment: MIN A from std height bed, VC for hand placement      Balance Overall balance assessment: Needs assistance Sitting-balance support: Feet supported;No upper extremity supported Sitting balance-Leahy Scale: Fair     Standing balance support: Reliant on assistive device for balance;Bilateral upper extremity supported;Single extremity supported;During functional activity Standing balance-Leahy Scale: Poor Standing balance comment: very unsteady, poor-fair                           ADL either performed or assessed with clinical judgement   ADL Overall ADL's : Needs assistance/impaired     Grooming: Sitting;Set up;Brushing hair               Lower Body Dressing: Sitting/lateral leans;Set up;Supervision/safety Lower Body Dressing Details (indicate cue type and reason): figure 4 technique to manage socks Toilet Transfer: Minimal assistance;BSC/3in1;Rolling walker (2 wheels);Ambulation;Cueing for safety;Cueing for sequencing   Toileting- Clothing Manipulation and Hygiene: Set up;Sit to/from stand;Min guard;Minimal assistance Toileting - Clothing Manipulation Details (indicate cue type and reason): CGA while he performed pericare intially, requiring MIN A for thoroughness  Vision Baseline Vision/History: 1 Wears glasses       Perception     Praxis      Pertinent Vitals/Pain Pain Assessment: No/denies pain     Hand Dominance      Extremity/Trunk Assessment Upper Extremity Assessment Upper Extremity Assessment: Generalized weakness   Lower Extremity Assessment Lower Extremity Assessment: Generalized weakness       Communication Communication Communication: HOH   Cognition Arousal/Alertness: Awake/alert Behavior During Therapy: WFL for tasks assessed/performed Overall Cognitive Status: No family/caregiver present to determine baseline cognitive functioning                                 General Comments: Alert, oriented, follows commands with VC, requires VC for safety/sequencing for ADL transfers to minimize LOB, appears to demo mild impairments in problem solving/safety     General Comments       Exercises Other Exercises Other Exercises: Pt instructed in ADL transfers, RW mgt, falls prevention   Shoulder Instructions      Home Living Family/patient expects to be discharged to:: Assisted living                             Home Equipment: Conservation officer, nature (2 wheels);Rollator (4 wheels)          Prior Functioning/Environment Prior Level of Function : Independent/Modified Independent             Mobility Comments: Use of RW or 4WW for mobility ADLs Comments: Receives meals but independently performs dressing, bathing (seated, uses baby wipes) and other ADL's. Does not leave community.        OT Problem List: Decreased strength;Decreased safety awareness;Decreased activity tolerance;Decreased knowledge of use of DME or AE;Impaired balance (sitting and/or standing)      OT Treatment/Interventions: Self-care/ADL training;Therapeutic exercise;Therapeutic activities;DME and/or AE instruction;Patient/family education;Balance training;Energy conservation    OT Goals(Current goals can be found in the care plan section) Acute Rehab OT Goals Patient Stated Goal: feel better and go back to ALF OT Goal Formulation: With patient Time For Goal Achievement:  09/05/21 Potential to Achieve Goals: Good ADL Goals Pt Will Perform Lower Body Dressing: with modified independence;sit to/from stand Pt Will Transfer to Toilet: with modified independence;ambulating (elevated commode, LRAD PRN) Pt Will Perform Toileting - Clothing Manipulation and hygiene: with modified independence;sitting/lateral leans Additional ADL Goal #1: Pt will verbalize plan to implement at least 1 learned falls prevention strategy.  OT Frequency: Min 2X/week   Barriers to D/C:            Co-evaluation              AM-PAC OT "6 Clicks" Daily Activity     Outcome Measure Help from another person eating meals?: None Help from another person taking care of personal grooming?: None Help from another person toileting, which includes using toliet, bedpan, or urinal?: A Little Help from another person bathing (including washing, rinsing, drying)?: A Little Help from another person to put on and taking off regular upper body clothing?: None Help from another person to put on and taking off regular lower body clothing?: A Little 6 Click Score: 21   End of Session Equipment Utilized During Treatment: Gait belt;Rolling walker (2 wheels) Nurse Communication: Other (comment);Mobility status (up in recliner, urine canister full, new linens for bed)  Activity Tolerance: Patient tolerated treatment well Patient left: in chair;with call bell/phone  within reach;with chair alarm set  OT Visit Diagnosis: Other abnormalities of gait and mobility (R26.89);History of falling (Z91.81);Muscle weakness (generalized) (M62.81)                Time: 0601-5615 OT Time Calculation (min): 36 min Charges:  OT General Charges $OT Visit: 1 Visit OT Evaluation $OT Eval Moderate Complexity: 1 Mod OT Treatments $Self Care/Home Management : 23-37 mins  Arman Filter., MPH, MS, OTR/L ascom [phone] 08/22/21, 1:46 PM

## 2021-08-22 NOTE — Consult Note (Signed)
Pharmacy Antibiotic Note  [name] [name] is [name] [name] 87 y.o. male w/ h/o Atrial Fibrillation (on Eliquis), T2DM, HLD, HTN, Acq'd hypothyroidism, PAD, & SSS (s/p PPM) admitted on 08/18/2021 with c/f possible seizure activity (no prior history) and aspiration pneumonia.  Pharmacy has been consulted for Zosyn dosing. - prescribed Avelox for pneumonia, due to suspected aspiration which started 12/2 am per daughter  Plan:  Day 4- Zosyn 3.375g IV q8h (4 hour infusion).   Crcl 32.81ml/min   Height: 5\' 11"  (180.3 cm) Weight: 86.1 kg (189 lb 13.1 oz) IBW/kg (Calculated) : 75.3  Temp (24hrs), Avg:97.8 F (36.6 C), Min:97.5 F (36.4 C), Max:98 F (36.7 C)  Recent Labs  Lab 08/18/21 1604 08/18/21 1758 08/18/21 2158 08/19/21 0718 08/20/21 0532 08/21/21 0418  WBC 12.5*  --   --  12.0*  --  7.1  CREATININE 2.23*  --   --  2.15* 1.75* 1.59*  LATICACIDVEN 2.4* 3.1* 7.7* 1.5  --   --      Estimated Creatinine Clearance: 32.2 mL/min ([name] [name]) (by C-G formula based on SCr of 1.59 mg/dL (H)).    Allergies  Allergen Reactions   Statins Palpitations    Antimicrobials this admission: VAN/CFP/Azith x1 in ED (12/2) Zosyn 12/2(evening) >>   Dose adjustments this admission: CTM closely ISO AKI (Scr 2.23; last known baseline ~0.9-1)  Microbiology results: 12/2 BCx: NG 4d 12/2 UCx: NG 12/2 Flu/Cov: negative  12/2 MRSA PCR: neg  Thank you for allowing pharmacy to be [name] [name] part of this patient's care.  [name] [name] [name] [name] 08/22/2021 9:25 AM

## 2021-08-23 LAB — BASIC METABOLIC PANEL
Anion gap: 6 (ref 5–15)
BUN: 36 mg/dL — ABNORMAL HIGH (ref 8–23)
CO2: 24 mmol/L (ref 22–32)
Calcium: 8.3 mg/dL — ABNORMAL LOW (ref 8.9–10.3)
Chloride: 107 mmol/L (ref 98–111)
Creatinine, Ser: 1.26 mg/dL — ABNORMAL HIGH (ref 0.61–1.24)
GFR, Estimated: 54 mL/min — ABNORMAL LOW (ref >60)
Glucose, Bld: 124 mg/dL — ABNORMAL HIGH (ref 70–99)
Potassium: 3.8 mmol/L (ref 3.5–5.1)
Sodium: 137 mmol/L (ref 135–145)

## 2021-08-23 LAB — CULTURE, BLOOD (ROUTINE X 2)
Culture: NO GROWTH
Culture: NO GROWTH
Special Requests: ADEQUATE
Special Requests: ADEQUATE

## 2021-08-23 LAB — GLUCOSE, CAPILLARY
Glucose-Capillary: 124 mg/dL — ABNORMAL HIGH (ref 70–99)
Glucose-Capillary: 169 mg/dL — ABNORMAL HIGH (ref 70–99)
Glucose-Capillary: 185 mg/dL — ABNORMAL HIGH (ref 70–99)
Glucose-Capillary: 161 mg/dL — ABNORMAL HIGH (ref 70–99)

## 2021-08-23 LAB — CBC
HCT: 28.1 % — ABNORMAL LOW (ref 39.0–52.0)
Hemoglobin: 8.4 g/dL — ABNORMAL LOW (ref 13.0–17.0)
MCH: 23.1 pg — ABNORMAL LOW (ref 26.0–34.0)
MCHC: 29.9 g/dL — ABNORMAL LOW (ref 30.0–36.0)
MCV: 77.4 fL — ABNORMAL LOW (ref 80.0–100.0)
Platelets: 178 10*3/uL (ref 150–400)
RBC: 3.63 MIL/uL — ABNORMAL LOW (ref 4.22–5.81)
RDW: 19.5 % — ABNORMAL HIGH (ref 11.5–15.5)
WBC: 6.1 10*3/uL (ref 4.0–10.5)
nRBC: 0.3 % — ABNORMAL HIGH (ref 0.0–0.2)

## 2021-08-23 LAB — FOLATE: Folate: 31 ng/mL (ref >5.9)

## 2021-08-23 LAB — IRON AND TIBC
Iron: 19 ug/dL — ABNORMAL LOW (ref 45–182)
Saturation Ratios: 5 % — ABNORMAL LOW (ref 17.9–39.5)
TIBC: 370 ug/dL (ref 250–450)
UIBC: 351 ug/dL

## 2021-08-23 LAB — VITAMIN B12: Vitamin B-12: 2527 pg/mL — ABNORMAL HIGH (ref 180–914)

## 2021-08-23 NOTE — Progress Notes (Signed)
PROGRESS NOTE    Estal Gelo  [id number] DOB: August 29, 1928 DOA: 08/18/2021 PCP: Ernesta Amble, MD  129A/129A-AA   Assessment & Plan:   Principal Problem:   Shock [facility]) Active Problems:   Symptomatic anemia   Sepsis with acute hypoxic respiratory failure and septic shock ([place])   Type 2 diabetes mellitus with hyperglycemia ([place])   Acute renal failure with tubular necrosis [facility])   Lynk Laberge is a 87 y.o. male with a history of atrial fibrillation, diabetes hypertension who was brought to the ED from [facility] assisted living facility due to seizure-like activities.  He does not have any history of seizures.  He did not fall, but per EMS had an apparent loss of consciousness associated with some involuntary muscle activity.  No recent trauma.   Sepsis  -lactic acid 7.7--1.5, elevated WBC, Acute renal failure  pneumonia suspect aspiration  -- chest x-ray shows by basilar atelectasis versus infiltrate -- CT abdomen pelvis shows left lower lobe consolidation -- started on IV Zosyn and transitioned to Augmentin --12/6-- patient underwent modified barium swallow. Per speech therapist oropharyngeal phase appears deconditioned and trace aspiration was noted. Speech therapy recommends follow-up therapy at the facility. Patient is at a high risk for aspiration.  Plan: Currently continue regular diet within liquid. --cont Augmentin to finish a 7-day course   acute on chronic anemia in the setting of anticoagulation with eliquis -- came in with hemoglobin of 5.6--- two unit blood transfusion 8.3-- 8.2 --occult blood positive -- seen by Dr. Haig Prophet G.I.-- recommends continue monitoring. -- No active bleeding documented -- IV Protonix-- changed to oral --Hgb stable ~8's Plan: --hold Eliquis --monitor Hgb  syncope versus possible seizure versus involuntary myoclonic activity -- no history of seizure disorder -- neurology consulted, dx with convulsive syncope in the setting of sepsis,  blood loss, and hypotension. -- patient was started on IV Keppra-- now discontinued by neurology since low clinical suspicion for seizure   acute renal failure  Hyperkalemia -- baseline creatinine ~1 one year ago. -- came in with creatinine of 2.23 --Cr improved with IVF --oral hydration now   chronic atrial fibrillation on eliquis --HR controlled Plan: --continue to hold eliquis --home dilt on hold for now due to intermittent low HR  Hyperlipidemia --cont Zetia  type II diabetes with neuropathy -- A1c 7.1 --SSI   Hypothyroidism -- continue Synthroid  BPH --monitor for urinary retention --encourage active voiding --cont home finasteride and Flomax   DVT prophylaxis: SCD/Compression stockings Code Status: DNR  Family Communication: daughter and son updated on the phone today  Level of care: Telemetry Medical Dispo:   The patient is from: ALF Anticipated d/c is to: SNF Anticipated d/c date is: whenever bed available  Patient currently is medically ready to d/c.   Subjective and Interval History:  Pt reported feeling worse today, tired from not sleeping well last night, because he had some pain lying on his right side and couldn't turn on his own.    Also found to have urine retaining in bladder scan, but was able to void when encouraged to do so.   Objective: Vitals:   08/23/21 0512 08/23/21 0808 08/23/21 1151 08/23/21 1639  BP: (!) 114/42 123/64 (!) 119/52 130/63  Pulse: (!) 59 60 (!) 58 (!) 59  Resp: 18 17 15 16   Temp: 97.6 F (36.4 C) 97.8 F (36.6 C) 97.7 F (36.5 C) 98.2 F (36.8 C)  TempSrc:  Oral Oral   SpO2: 92% 94% 95% 100%  Weight:  Height:        Intake/Output Summary (Last 24 hours) at 08/23/2021 2051 Last data filed at 08/23/2021 1856 Gross per 24 hour  Intake 720 ml  Output --  Net 720 ml   Filed Weights   08/18/21 1520 08/19/21 0500  Weight: 75 kg 86.1 kg    Examination:   Constitutional: NAD, AAOx3 HEENT: conjunctivae  and lids normal, EOMI CV: No cyanosis.   RESP: normal respiratory effort, on RA SKIN: warm, dry Neuro: II - XII grossly intact.   Psych: depressed mood and affect.  Appropriate judgement and reason   Data Reviewed: I have personally reviewed following labs and imaging studies  CBC: Recent Labs  Lab 08/18/21 1604 08/19/21 0718 08/19/21 0951 08/21/21 0418 08/23/21 0336  WBC 12.5* 12.0*  --  7.1 6.1  NEUTROABS 10.8*  --   --   --   --   HGB 5.6* 8.3* 8.2* 8.2* 8.4*  HCT 18.9* 26.4* 26.1* 26.7* 28.1*  MCV 73.3* 77.2*  --  77.2* 77.4*  PLT 200 180  --  184 0000000   Basic Metabolic Panel: Recent Labs  Lab 08/18/21 1604 08/19/21 0718 08/20/21 0532 08/21/21 0418 08/23/21 0336  NA 134* 133* 137 139 137  K 5.5* 4.6 4.1 3.9 3.8  CL 103 106 110 109 107  CO2 23 20* 21* 24 24  GLUCOSE 262* 165* 147* 133* 124*  BUN 62* 62* 58* 50* 36*  CREATININE 2.23* 2.15* 1.75* 1.59* 1.26*  CALCIUM 8.9 8.0* 8.3* 8.3* 8.3*  MG  --  2.1  --   --   --   PHOS  --  5.0*  --   --   --    GFR: Estimated Creatinine Clearance: 40.7 mL/min (A) (by C-G formula based on SCr of 1.26 mg/dL (H)). Liver Function Tests: Recent Labs  Lab 08/18/21 1604  AST 43*  ALT 46*  ALKPHOS 81  BILITOT 0.9  PROT 6.2*  ALBUMIN 3.3*   No results for input(s): LIPASE, AMYLASE in the last 168 hours. Recent Labs  Lab 08/19/21 1626  AMMONIA <10   Coagulation Profile: Recent Labs  Lab 08/18/21 1604 08/19/21 0718  INR 2.4* 2.4*   Cardiac Enzymes: No results for input(s): CKTOTAL, CKMB, CKMBINDEX, TROPONINI in the last 168 hours. BNP (last 3 results) No results for input(s): PROBNP in the last 8760 hours. HbA1C: No results for input(s): HGBA1C in the last 72 hours. CBG: Recent Labs  Lab 08/22/21 1644 08/22/21 2115 08/23/21 0820 08/23/21 1153 08/23/21 1640  GLUCAP 132* 161* 124* 169* 185*   Lipid Profile: No results for input(s): CHOL, HDL, LDLCALC, TRIG, CHOLHDL, LDLDIRECT in the last 72  hours. Thyroid Function Tests: No results for input(s): TSH, T4TOTAL, FREET4, T3FREE, THYROIDAB in the last 72 hours. Anemia Panel: Recent Labs    08/23/21 0336  FOLATE 31.0  TIBC 370  IRON 19*   Sepsis Labs: Recent Labs  Lab 08/18/21 1604 08/18/21 1758 08/18/21 2158 08/19/21 0718  PROCALCITON <0.10  --   --  0.10  LATICACIDVEN 2.4* 3.1* 7.7* 1.5    Recent Results (from the past 240 hour(s))  Resp Panel by RT-PCR (Flu A&B, Covid) Nasopharyngeal Swab     Status: None   Collection Time: 08/18/21  4:04 PM   Specimen: Nasopharyngeal Swab; Nasopharyngeal(NP) swabs in vial transport medium  Result Value Ref Range Status   SARS Coronavirus 2 by RT PCR NEGATIVE NEGATIVE Final    Comment: (NOTE) SARS-CoV-2 target nucleic acids are NOT DETECTED.  The SARS-CoV-2 RNA is generally detectable in upper respiratory specimens during the acute phase of infection. The lowest concentration of SARS-CoV-2 viral copies this assay can detect is 138 copies/mL. A negative result does not preclude SARS-Cov-2 infection and should not be used as the sole basis for treatment or other patient management decisions. A negative result may occur with  improper specimen collection/handling, submission of specimen other than nasopharyngeal swab, presence of viral mutation(s) within the areas targeted by this assay, and inadequate number of viral copies(<138 copies/mL). A negative result must be combined with clinical observations, patient history, and epidemiological information. The expected result is Negative.  Fact Sheet for Patients:  EntrepreneurPulse.com.au  Fact Sheet for Healthcare Providers:  IncredibleEmployment.be  This test is no t yet approved or cleared by the [place] FDA and  has been authorized for detection and/or diagnosis of SARS-CoV-2 by FDA under an Emergency Use Authorization (EUA). This EUA will remain  in effect (meaning this test can  be used) for the duration of the COVID-19 declaration under Section 564(b)(1) of the Act, 21 U.S.C.section 360bbb-3(b)(1), unless the authorization is terminated  or revoked sooner.       Influenza A by PCR NEGATIVE NEGATIVE Final   Influenza B by PCR NEGATIVE NEGATIVE Final    Comment: (NOTE) The Xpert Xpress SARS-CoV-2/FLU/RSV plus assay is intended as an aid in the diagnosis of influenza from Nasopharyngeal swab specimens and should not be used as a sole basis for treatment. Nasal washings and aspirates are unacceptable for Xpert Xpress SARS-CoV-2/FLU/RSV testing.  Fact Sheet for Patients: EntrepreneurPulse.com.au  Fact Sheet for Healthcare Providers: IncredibleEmployment.be  This test is not yet approved or cleared by the [place] FDA and has been authorized for detection and/or diagnosis of SARS-CoV-2 by FDA under an Emergency Use Authorization (EUA). This EUA will remain in effect (meaning this test can be used) for the duration of the COVID-19 declaration under Section 564(b)(1) of the Act, 21 U.S.C. section 360bbb-3(b)(1), unless the authorization is terminated or revoked.  Performed at [facility], [address] [place], [place] [address]   Blood Culture (routine x 2)     Status: None   Collection Time: 08/18/21  4:04 PM   Specimen: BLOOD  Result Value Ref Range Status   Specimen Description BLOOD LEFT ANTECUBITAL  Final   Special Requests   Final    BOTTLES DRAWN AEROBIC AND ANAEROBIC Blood Culture adequate volume   Culture   Final    NO GROWTH 5 DAYS Performed at [facility], [address], [place], [place] [address]    Report Status 08/23/2021 FINAL  Final  Blood Culture (routine x 2)     Status: None   Collection Time: 08/18/21  5:57 PM   Specimen: BLOOD  Result Value Ref Range Status   Specimen Description BLOOD BLOOD RIGHT FOREARM  Final   Special Requests   Final    BOTTLES DRAWN  AEROBIC AND ANAEROBIC Blood Culture adequate volume   Culture   Final    NO GROWTH 5 DAYS Performed at [facility], [address], [place], [place] [address]    Report Status 08/23/2021 FINAL  Final  MRSA Next Gen by PCR, Nasal     Status: None   Collection Time: 08/18/21  8:31 PM   Specimen: Nasal Mucosa; Nasal Swab  Result Value Ref Range Status   MRSA by PCR Next Gen NOT DETECTED NOT DETECTED Final    Comment: (NOTE) The GeneXpert MRSA  Assay (FDA approved for NASAL specimens only), is one component of a comprehensive MRSA colonization surveillance program. It is not intended to diagnose MRSA infection nor to guide or monitor treatment for MRSA infections. Test performance is not FDA approved in patients less than 7 years old. Performed at [facility], [address], [place], [place] [address]   Urine Culture     Status: None   Collection Time: 08/20/21  1:00 PM   Specimen: Urine, Random  Result Value Ref Range Status   Specimen Description   Final    URINE, RANDOM Performed at [facility], [address], [place], [place] [address]    Special Requests   Final    NONE Performed at [facility], [address], [place], [place] [address]    Culture   Final    NO GROWTH Performed at [facility], [address], [place], [place] [address]    Report Status 08/22/2021 FINAL  Final      Radiology Studies: EEG adult  Result Date: 17-Sep-2021 Jefferson Fuel, MD     08/22/2021  2:50 PM Routine EEG Report Holdan Stucke is a 87 y.o. male with a history of spells who is undergoing an EEG to evaluate for seizures. Report: This EEG was acquired with electrodes placed according to the International 10-20 electrode system (including Fp1, Fp2, F3, F4, C3, C4, P3, P4, O1, O2, T3, T4, T5, T6, A1, A2, Fz, Cz, Pz). The following electrodes were missing or displaced: none. The occipital dominant rhythm was 8.5 Hz. This activity is reactive to  stimulation. Drowsiness was manifested by background fragmentation; deeper stages of sleep were identified by K complexes and sleep spindles. There was no focal slowing. There were no interictal epileptiform discharges. There were no electrographic seizures identified. There was no abnormal response to photic stimulation or hyperventilation. Impression: This EEG was obtained while awake and asleep and is normal.   Clinical Correlation: Normal EEGs, however, do not rule out epilepsy. Bing Neighbors, MD [facility] [phone] If 7pm- 7am, please page neurology on call as listed in AMION.     Scheduled Meds:  amoxicillin-clavulanate  875 mg Oral Q12H   cholecalciferol   Oral Daily   ezetimibe  10 mg Oral Daily   finasteride  5 mg Oral Daily   insulin aspart  0-5 Units Subcutaneous QHS   insulin aspart  0-9 Units Subcutaneous TID WC   levothyroxine  125 mcg Oral QAC breakfast   multivitamin with minerals  1 tablet Oral Daily   pantoprazole  40 mg Oral Daily   psyllium  1 packet Oral QPM   tamsulosin  0.4 mg Oral QHS   cyanocobalamin  1,000 mcg Oral Daily   Continuous Infusions:   LOS: 5 days     Darlin Priestly, MD [facility] If 7PM-7AM, please contact night-coverage 08/23/2021, 8:51 PM

## 2021-08-23 NOTE — TOC Progression Note (Signed)
Transition of Care [facility]) - Progression Note    Patient Details  Name: [name] [name] MRN: [id number] Date of Birth: 1928/03/08  Transition of [facility]) CM/SW Contact  Caryn Section, RN Phone Number: 08/23/2021, 3:43 PM  Clinical Narrative:   Patient and daughter chose [facility] in [place], facility notified of choice, therapy notes sent.  Daughter states she is concerned about patient's condition and asks to speak with MD.  Notification sent to MD to call daughter.    Expected Discharge Plan: Skilled Nursing Facility Barriers to Discharge: Continued Medical Work up  Expected Discharge Plan and Services Expected Discharge Plan: Skilled Nursing Facility   Discharge Planning Services: CM Consult Post Acute Care Choice: Skilled Nursing Facility Living arrangements for the past 2 months: Assisted Living Facility                                       Social Determinants of Health (SDOH) Interventions    Readmission Risk Interventions No flowsheet data found.

## 2021-08-23 NOTE — TOC Progression Note (Signed)
Transition of Care [facility]) - Progression Note    Patient Details  Name: [name] [name] MRN: [id number] Date of Birth: 12-19-1927  Transition of Care [facility]) CM/SW Contact  Caryn Section, RN Phone Number: 08/23/2021, 9:20 AM  Clinical Narrative:   [name] [phone], awaiting bed offers    Expected Discharge Plan: Skilled Nursing Facility Barriers to Discharge: Continued Medical Work up  Expected Discharge Plan and Services Expected Discharge Plan: Skilled Nursing Facility   Discharge Planning Services: CM Consult Post Acute Care Choice: Skilled Nursing Facility Living arrangements for the past 2 months: Assisted Living Facility                                       Social Determinants of Health (SDOH) Interventions    Readmission Risk Interventions No flowsheet data found.

## 2021-08-23 NOTE — Progress Notes (Signed)
Speech Language Pathology Treatment: Dysphagia  Patient Details Name: [name] [name] MRN: [id number] DOB: [birth year] Today's Date: 08/23/2021 Time: 0964-3838 SLP Time Calculation (min) (ACUTE ONLY): 45 min  Assessment / Plan / Recommendation Clinical Impression  Pt seen today for ongoing assessment of swallowing post MBSS yesterday. Discussed w/ pt the results again highlighting his risk for aspiration of the pharyngeal residue and the need to follow aspiration precautions and compensatory swallowing strategies. Pt showed me the Handouts given to him by this SLP and could read the strategies/precautions. Pt is verbal and presents w/ adequate awareness but concerned about a Fatigue Factor for him as he continues to move forward managing his Dysphagia.   Noted min Baseline congested cough. Oral Care completed prior to po's of lunch meal. Pt required positioning upright in bed for more forward support. Pt is on RA; afebrile and wbc WNL.   Pt and this SLP discussed the strategies and aspiration precautions practicing them before po's consumed. Min-Mod cues needed intermittently for full follow-through. He seems to over-think the strategies as he is trying to implement them. He consumed trials of thin liquids Via Cup and soft solids(grilled cheese and slaw) w/ No consistent, overt clinical s/s of aspiration were noted w/ consistencies; respiratory status remained calm and unlabored, vocal quality fairly clear b/t trials. Delayed coughing noted x1 each post lengthy mastication w/ sandwich (residue build-up?) and sips of thin liquids when pt tended to SWISH the liquids and holding them orally. SLP clarified NO SWISHING of liquids, prompt swallowing, followed by lingual sweep and DRY swallow. Pt verbally agreed not fully aware he was SWISHING his liquids it appeared as he could not explain why he was doing so. Intermittent verbal cues given for follow-through w/ DRY swallows and TIME b/t bites/sips for DRY  swallowing.  NO STRAWS RECOMMENDED. Oral phase c/b increased mastication and bolus management time; oral holding and SWISHING. Suspect a degree of fatigue and declined Medical status impacting Cognition -- w/ pt's advanced age 87. Reducing Distractions was helpful during po trials (tv, closing door, less items on tray).   Pt appears at increased risk for aspiration d/t oropharyngeal phase dysphagia as seen on MBSS in setting of declined Medical status and advanced age 87. Recommend continue a regular diet w/ mech soft meats w/ gravies added to moisten foods; Thin liquids VIA CUP. Recommend aspiration precautions and compensatory swallowing strategies including f/u, DRY SWALLOWS b/t bites/sips; Pills Whole in Puree for cohesion and safer swallowing currently; tray setup and positioning assistance for meals w/ support/Supervision during meals for follow-through w/ precautions/strategies. General reflux precautions recommended. Precautions and strategies given/posted at bedside. MD/NSG updated. Much education w/ pt and family on above, and pt's overall status. Questions answered. Pt can continue w/ ST services at next venue of care for ongoing education and monitoring of clinical status. Support and guidance for family and pt from [place] would be beneficial.       HPI HPI: 87 yo M presenting to [facility] ED on 08/18/21 via EMS from [facility] with complaints of possible seizure activity; possible seizure versus involuntary myoclonic activity per MD note. No history of seizures. PMH includes: DM, HTN, urinary incontinence.  CXR: Bibasilar areas of atelectasis and/or consolidation with  superimposed moderate right and small left pleural effusions.  2. Mild cardiomegaly. MD notes/dx currently indicate: Sepsis in the setting of G.I. bleed/ pneumonia suspect aspiration; with symptomatic anemia; Acute renal failure.      SLP Plan  All goals met (he can be followed at  next venue of care for ongoing monitoring;  education w/ pt/family on dysphagia, aspiration precautions, compensatory swallowing strategies) -- education provided on pt's swallowing compensatory strategies and aspiration precautions per the MBSS(see results in eval) -- written Handouts highlighting the strategies and precautions to use during all oral intake given to pt/family.      Recommendations for follow up therapy are one component of a multi-disciplinary discharge planning process, led by the attending physician.  Recommendations may be updated based on patient status, additional functional criteria and insurance authorization.    Recommendations  Diet recommendations: Regular;Thin liquid (cut foods/meats, moistened) Liquids provided via: Cup;No straw Medication Administration: Whole meds with puree Supervision: Patient able to self feed;Intermittent supervision to cue for compensatory strategies Compensations: Minimize environmental distractions;Slow rate;Small sips/bites;Lingual sweep for clearance of pocketing;Multiple dry swallows after each bite/sip;Follow solids with liquid;Clear throat intermittently Postural Changes and/or Swallow Maneuvers: Out of bed for meals;Seated upright 90 degrees;Upright 30-60 min after meal (Reflux precautions)                General recommendations:  (Palliative Care for ongoing support/GOC; Dietician f/u as needed) Oral Care Recommendations: Oral care BID;Oral care before and after PO;Patient independent with oral care;Staff/trained caregiver to provide oral care (support) Follow Up Recommendations: Skilled nursing-short term rehab (<3 hours/day) Assistance recommended at discharge: Intermittent Supervision/Assistance SLP Visit Diagnosis: Dysphagia, oropharyngeal phase (R13.12) (see mbss) Plan: All goals met (he can be followed at next venue of care for ongoing monitoring; education w/ pt/family on dysphagia, aspiration precautions, compensatory swallowing strategies)       GO                   Orinda Kenner, MS, CCC-SLP Speech Language Pathologist Rehab Services [phone] [facility]  08/23/2021, 4:10 PM

## 2021-08-23 NOTE — Progress Notes (Signed)
PT Cancellation Note  Patient Details Name: [name] [name] MRN: [id number] DOB: March 18, 1928   Cancelled Treatment:    Reason Eval/Treat Not Completed: Fatigue/lethargy limiting ability to participate. Pt reports feeling very tired today. Requesting PT to attempt tomorrow. Will re-attempt as able tomorrow.   Delphia Grates. Fairly IV, PT, DPT Physical Therapist- [place]  [facility]  08/23/2021, 3:29 PM

## 2021-08-23 NOTE — NC FL2 (Signed)
[place] LEVEL OF CARE SCREENING TOOL     IDENTIFICATION  Patient Name: [name] [name] Birthdate: 02/05/1928 Sex: male Admission Date (Current Location): 08/18/2021  [place] and [place] Number:  Engineering geologist and Address:  [facility], [address] [place], [place] [address]      Provider Number: [id number]  Attending Physician Name and Address:  Enzo Bi, MD  Relative Name and Phone Number:  Florene Route (Daughter)   [phone] (Mobile)    Current Level of Care: Hospital Recommended Level of Care: [place] Prior Approval Number:    Date Approved/Denied:   PASRR Number: [id number]  Discharge Plan: SNF    Current Diagnoses: Patient Active Problem List   Diagnosis Date Noted   Sepsis with acute hypoxic respiratory failure and septic shock ([place])    Type 2 diabetes mellitus with hyperglycemia ([place])    Acute renal failure with tubular necrosis (HCC)    Shock ([place]) 08/18/2021   Symptomatic anemia    Gross hematuria 09/10/2020   Diabetes mellitus without complication (HCC)    Hypertension    PAD (peripheral artery disease) (HCC)    Osteomyelitis, chronic, lower leg, left (HCC)    Hypothyroid    Lung nodule seen on imaging study     Orientation RESPIRATION BLADDER Height & Weight     Self  Normal Incontinent Weight: 86.1 kg Height:  5\' 11"  (180.3 cm)  BEHAVIORAL SYMPTOMS/MOOD NEUROLOGICAL BOWEL NUTRITION STATUS        Diet (Heart)  AMBULATORY STATUS COMMUNICATION OF NEEDS Skin   Limited Assist Verbally Normal, Bruising (bilateral lower legs)                       Personal Care Assistance Level of Assistance  Bathing, Feeding, Dressing Bathing Assistance: Limited assistance Feeding assistance: Limited assistance Dressing Assistance: Limited assistance     Functional Limitations Info  Sight, Hearing, Speech Sight Info: Impaired Hearing Info: Impaired Speech Info: Adequate    SPECIAL  CARE FACTORS FREQUENCY  PT (By licensed PT), OT (By licensed OT)   Diabetic Urine Testing Frequency: na PT Frequency: 5x weely OT Frequency: 5x weekly            Contractures Contractures Info: Not present    Additional Factors Info  Code Status, Allergies Code Status Info: DNR Allergies Info: Statins           Current Medications (08/23/2021):  This is the current hospital active medication list Current Facility-Administered Medications  Medication Dose Route Frequency Provider Last Rate Last Admin   0.9 %  sodium chloride infusion  250 mL Intravenous Continuous Carrie Mew, MD   Stopped at 08/21/21 1449   acetaminophen (TYLENOL) tablet 650 mg  650 mg Oral Q6H PRN Fritzi Mandes, MD   650 mg at 08/23/21 0516   amoxicillin-clavulanate (AUGMENTIN) 400-57 MG/5ML suspension 875 mg  875 mg Oral Q12H Fritzi Mandes, MD   875 mg at 08/23/21 [id number]   cholecalciferol (VITAMIN D) tablet   Oral Daily Fritzi Mandes, MD   1,000 Units at 08/23/21 [id number]   docusate sodium (COLACE) capsule 100 mg  100 mg Oral BID PRN Flora Lipps, MD       ezetimibe (ZETIA) tablet 10 mg  10 mg Oral Daily Fritzi Mandes, MD   10 mg at 08/23/21 0909   finasteride (PROSCAR) tablet 5 mg  5 mg Oral Daily Fritzi Mandes, MD   5 mg at 08/23/21 0909   guaiFENesin (ROBITUSSIN)  100 MG/5ML liquid 5 mL  5 mL Oral Q4H PRN Eduard Clos, MD       insulin aspart (novoLOG) injection 0-5 Units  0-5 Units Subcutaneous QHS Enedina Finner, MD   2 Units at 08/19/21 2142   insulin aspart (novoLOG) injection 0-9 Units  0-9 Units Subcutaneous TID WC Enedina Finner, MD   1 Units at 08/23/21 5916   levothyroxine (SYNTHROID) tablet 125 mcg  125 mcg Oral QAC breakfast Enedina Finner, MD   125 mcg at 08/23/21 0840   multivitamin with minerals tablet 1 tablet  1 tablet Oral Daily Enedina Finner, MD   1 tablet at 08/23/21 0909   pantoprazole (PROTONIX) EC tablet 40 mg  40 mg Oral Daily Enedina Finner, MD   40 mg at 08/23/21 0910   polyethylene glycol (MIRALAX /  GLYCOLAX) packet 17 g  17 g Oral Daily PRN Erin Fulling, MD       psyllium (HYDROCIL/METAMUCIL) 1 packet  1 packet Oral QPM Enedina Finner, MD   1 packet at 08/22/21 1824   tamsulosin (FLOMAX) capsule 0.4 mg  0.4 mg Oral QHS Enedina Finner, MD   0.4 mg at 08/23/21 0120   vitamin B-12 (CYANOCOBALAMIN) tablet 1,000 mcg  1,000 mcg Oral Daily Enedina Finner, MD   1,000 mcg at 08/23/21 0909     Discharge Medications: Please see discharge summary for a list of discharge medications.  Relevant Imaging Results:  Relevant Lab Results:   Additional Information SSN [id number]  Caryn Section, RN

## 2021-08-24 LAB — GLUCOSE, CAPILLARY
Glucose-Capillary: 113 mg/dL — ABNORMAL HIGH (ref 70–99)
Glucose-Capillary: 184 mg/dL — ABNORMAL HIGH (ref 70–99)

## 2021-08-24 LAB — CBC
HCT: 27.8 % — ABNORMAL LOW (ref 39.0–52.0)
Hemoglobin: 8.3 g/dL — ABNORMAL LOW (ref 13.0–17.0)
MCH: 23.1 pg — ABNORMAL LOW (ref 26.0–34.0)
MCHC: 29.9 g/dL — ABNORMAL LOW (ref 30.0–36.0)
MCV: 77.2 fL — ABNORMAL LOW (ref 80.0–100.0)
Platelets: 166 10*3/uL (ref 150–400)
RBC: 3.6 MIL/uL — ABNORMAL LOW (ref 4.22–5.81)
RDW: 19.5 % — ABNORMAL HIGH (ref 11.5–15.5)
WBC: 6.6 10*3/uL (ref 4.0–10.5)
nRBC: 0.5 % — ABNORMAL HIGH (ref 0.0–0.2)

## 2021-08-24 LAB — BASIC METABOLIC PANEL
Anion gap: 4 — ABNORMAL LOW (ref 5–15)
BUN: 28 mg/dL — ABNORMAL HIGH (ref 8–23)
CO2: 25 mmol/L (ref 22–32)
Calcium: 8.3 mg/dL — ABNORMAL LOW (ref 8.9–10.3)
Chloride: 108 mmol/L (ref 98–111)
Creatinine, Ser: 1 mg/dL (ref 0.61–1.24)
GFR, Estimated: >60 mL/min (ref >60)
Glucose, Bld: 118 mg/dL — ABNORMAL HIGH (ref 70–99)
Potassium: 4.1 mmol/L (ref 3.5–5.1)
Sodium: 137 mmol/L (ref 135–145)

## 2021-08-24 LAB — RESP PANEL BY RT-PCR (FLU A&B, COVID) ARPGX2
Influenza A by PCR: NEGATIVE
Influenza B by PCR: NEGATIVE
SARS Coronavirus 2 by RT PCR: NEGATIVE

## 2021-08-24 LAB — MAGNESIUM: Magnesium: 2.1 mg/dL (ref 1.7–2.4)

## 2021-08-24 MED ORDER — FERROUS SULFATE 325 (65 FE) MG PO TABS: 325.0000 mg | ORAL_TABLET | Freq: Every day | ORAL | 3 refills | Status: DC

## 2021-08-24 MED ORDER — FUROSEMIDE 20 MG PO TABS: ORAL_TABLET | ORAL | Status: DC

## 2021-08-24 MED ORDER — FERROUS SULFATE 325 (65 FE) MG PO TABS
325.0000 mg | ORAL_TABLET | Freq: Every day | ORAL | Status: DC
  Administered 2021-08-24: 325 mg via ORAL
  Filled 2021-08-24 (×2): qty 1

## 2021-08-24 MED ORDER — DILTIAZEM HCL ER BEADS 180 MG PO CP24: ORAL_CAPSULE | ORAL | Status: DC

## 2021-08-24 MED ORDER — PANTOPRAZOLE SODIUM 40 MG PO TBEC: 40.0000 mg | DELAYED_RELEASE_TABLET | Freq: Every day | ORAL | Status: AC

## 2021-08-24 MED ORDER — GABAPENTIN 300 MG PO CAPS
300.0000 mg | ORAL_CAPSULE | Freq: Every day | ORAL | Status: DC
  Administered 2021-08-24: 15:00:00 300 mg via ORAL
  Filled 2021-08-24: qty 1

## 2021-08-24 MED ORDER — APIXABAN 5 MG PO TABS: ORAL_TABLET | ORAL | Status: DC

## 2021-08-24 NOTE — Discharge Summary (Addendum)
Physician Discharge Summary   [name] [name]  male DOB: [birth year]  YI:[id number]  PCP: Ernesta Amble, MD  Admit date: 08/18/2021 Discharge date: 08/24/2021  Admitted From: ALF Disposition:  SNF CODE STATUS: DNR  Discharge Instructions     Diet - low sodium heart healthy   Complete by: As directed    Discharge wound care:   Complete by: As directed    Wash right medial ankle wound area with soap and water, pat dry. Place a small piece of Aquacel Advantage Kellie Simmering [phone]) over the area, then a small foam dressing. [facility] Course:  For full details, please see H&P, progress notes, consult notes and ancillary notes.  Briefly,  [name] [name] is a 87 y.o. male with a history of atrial fibrillation, diabetes, hypertension who was brought to the ED from [facility] assisted living facility due to seizure-like activities.  He does not have any history of seizures.  He did not fall, but per EMS had an apparent loss of consciousness associated with some involuntary muscle activity.  No recent trauma.   Sepsis, ruled out --did not meet criteria, only had elevated WBC.  No fever, no tachycardia, RR only intermittently elevated.   Pneumonia suspect aspiration  -- chest x-ray showed by basilar atelectasis versus infiltrate -- CT abdomen pelvis showed left lower lobe consolidation -- started on IV Zosyn and transitioned to Augmentin and completed a 7-day course. --On 12/6, patient underwent modified barium swallow. Per speech therapist oropharyngeal phase appears deconditioned and trace aspiration was noted. Speech therapy recommends follow-up therapy at the facility. Patient is at a high risk for aspiration.  Daughter is aware and wants pt to continue regular diet with thin liquid.  acute on chronic anemia in the setting of anticoagulation with Eliquis Iron def --came in with hemoglobin of 5.6.  No active bleeding noted, though suspect occult slow GI bleed exacerbated by blood  thinner. --Home Eliquis held.  Pt received two unit blood transfusion, Hgb has been stable around 8's since transfusion. -- seen by Dr. Lynett Grimes.I., recommends monitoring for now. -- IV Protonix started and changed to oral, and discharged on protonix daily. --anemia workup showed only iron def, pt started on iron supplement.  syncope versus possible seizure versus involuntary myoclonic activity -- no history of seizure disorder -- neurology consulted, dx with convulsive syncope in the setting of infection, blood loss, and hypotension. -- patient was started on IV Keppra, since discontinued by neurology since low clinical suspicion for seizure  Lactic acidosis, POA --lactic acid 2.4 on presentation and trended up to 7.7, resolved with IVF.  Lactic acidosis likely contributed by pt's convulsion and hypotension.   Hypotension, likely hypovolemic --shortly after presentation, pt became hypotensive with systolic in [id number].  Received 3.5L NS and pressors in ICU.  Weaned off pressor the next day.   --Home dilt and lasix held, and blood pressure had been wnl during hospitalization without blood pressure meds.      Acute renal failure  Hyperkalemia -- baseline creatinine ~1 one year ago. -- came in with creatinine of 2.23 --Cr improved with IVF.  Cr 1.0 prior to discharge. --Ensure oral hydration   chronic atrial fibrillation on eliquis --home dilt held due to intermittent low HR. --Home Eliquis held due to Hgb drop with suspected occult GI bleed. --family requests outpatient followup with PCP to determine whether to resume anticoagulation.   Hyperlipidemia --cont Zetia   type II diabetes, well controlled  with neuropathy --A1c 7.1 --SSI while inpatient.  Home Januvia held during hospitalization and resumed after discharge. --Home gabapentin 300 mg nightly held during hospitalization, resumed prior to discharge.   Hypothyroidism -- continue Synthroid   BPH --monitor for urinary  retention --encourage active voiding --cont home finasteride and Flomax   Discharge Diagnoses:  Principal Problem:   Shock ([place]) Active Problems:   Symptomatic anemia   Sepsis with acute hypoxic respiratory failure and septic shock (HCC)   Type 2 diabetes mellitus with hyperglycemia (HCC)   Acute renal failure with tubular necrosis ([place])   30 Day Unplanned Readmission Risk Score    Flowsheet Row ED to Hosp-Admission (Current) from 08/18/2021 in [place] (1C)  30 Day Unplanned Readmission Risk Score (%) 20.01 Filed at 08/24/2021 1200       This score is the patient's risk of an unplanned readmission within 30 days of being discharged (0 -100%). The score is based on dignosis, age, lab data, medications, orders, and past utilization.   Low:  0-14.9   Medium: 15-21.9   High: 22-29.9   Extreme: 30 and above         Discharge Instructions:  Allergies as of 08/24/2021       Reactions   Statins Palpitations        Medication List     STOP taking these medications    moxifloxacin 400 MG tablet Commonly known as: AVELOX       TAKE these medications    acetaminophen 500 MG tablet Commonly known as: TYLENOL Take 500 mg by mouth every 8 (eight) hours as needed for mild pain.   apixaban 5 MG Tabs tablet Commonly known as: ELIQUIS Hold until followup with PCP due to drop in hemoglobin and suspected occult bleed. What changed:  how much to take how to take this when to take this additional instructions   CALAZIME SKIN PROTECTANT EX Apply 1 application topically as needed (periwound area).   Cholecalciferol 25 MCG (1000 UT) tablet Take 1,000 mg by mouth daily.   cyanocobalamin 1000 MCG tablet Take 1,000 mcg by mouth daily.   diltiazem 180 MG 24 hr capsule Commonly known as: TIAZAC Hold until outpatient followup due to normal blood pressure without it. What changed:  how much to take how to take this when to take  this additional instructions   ezetimibe 10 MG tablet Commonly known as: ZETIA Take 10 mg by mouth daily.   ferrous sulfate 325 (65 FE) MG tablet Take 1 tablet (325 mg total) by mouth daily with breakfast. Start taking on: August 25, 2021   finasteride 5 MG tablet Commonly known as: PROSCAR Take 5 mg by mouth daily.   furosemide 20 MG tablet Commonly known as: LASIX Hold until outpatient followup due to blood pressure normal without it. What changed:  how much to take how to take this when to take this additional instructions   gabapentin 300 MG capsule Commonly known as: NEURONTIN Take 300 mg by mouth at bedtime.   levothyroxine 125 MCG tablet Commonly known as: SYNTHROID Take 125 mcg by mouth daily before breakfast.   Metamucil Smooth Texture 58.6 % powder Generic drug: psyllium Take 1 packet by mouth every evening.   multivitamin with minerals Tabs tablet Take 1 tablet by mouth daily.   pantoprazole 40 MG tablet Commonly known as: PROTONIX Take 1 tablet (40 mg total) by mouth daily. Start taking on: August 25, 2021   sitaGLIPtin 100 MG tablet Commonly known  as: JANUVIA Take 100 mg by mouth daily.   tamsulosin 0.4 MG Caps capsule Commonly known as: FLOMAX Take 0.4 mg by mouth at bedtime.               Discharge Care Instructions  (From admission, onward)           Start     Ordered   08/24/21 0000  Discharge wound care:       Comments: Wash right medial ankle wound area with soap and water, pat dry. Place a small piece of Aquacel Advantage Kellie Simmering [phone]) over the area, then a small foam dressing. - -   08/24/21 1310             Contact information for follow-up providers     Ernesta Amble, MD Follow up in 1 week(s).   Specialty: Internal Medicine Why: Facillity to make follow up appt Contact information: [place] [address] [phone]              Contact information for after-discharge care      Destination     [facility] Preferred SNF .   [name]: Skilled Nursing Contact information: [place] [phone]                     Allergies  Allergen Reactions   Statins Palpitations     The results of significant diagnostics from this hospitalization (including imaging, microbiology, ancillary and laboratory) are listed below for reference.   Consultations:   Procedures/Studies: CT ABDOMEN PELVIS WO CONTRAST  Result Date: 08/19/2021 CLINICAL DATA:  Abdominal distension EXAM: CT ABDOMEN AND PELVIS WITHOUT CONTRAST TECHNIQUE: Multidetector CT imaging of the abdomen and pelvis was performed following the standard protocol without IV contrast. COMPARISON:  09/10/2020 FINDINGS: Lower chest: Bilateral pleural effusions are noted right greater than left. Considerable material is noted within the lower lobe bronchial tree with associated patchy areas of consolidation. These changes could represent bilateral aspiration. Coronary calcifications are noted. Hepatobiliary: No focal liver abnormality is seen. Status post cholecystectomy. No biliary dilatation. Pancreas: Unremarkable. No pancreatic ductal dilatation or surrounding inflammatory changes. Spleen: Normal in size without focal abnormality. Adrenals/Urinary Tract: Adrenal glands are within normal limits bilaterally. Kidneys are well visualized bilaterally without renal calculi or obstructive changes. The ureters are within normal limits. The bladder is partially distended. Hypodensities are noted within the left kidney likely representing cysts. This is stable from the prior exam. Stomach/Bowel: Circumferential wall thickening is noted within the rectum which may represent some focal proctitis. No perforation is noted. The more proximal colon is within normal limits. The cecum extends inferiorly into a right inguinal hernia along with a loop of terminal ileum. No  obstructive changes are seen. The more proximal small bowel is within normal limits with the exception of a small duodenal diverticulum adjacent to the head of pancreas. Stomach is decompressed. Appendix has been surgically removed Vascular/Lymphatic: Aortic atherosclerosis. No enlarged abdominal or pelvic lymph nodes. Reproductive: Prostate is unremarkable. Other: No abdominal wall hernia or abnormality. No abdominopelvic ascites. Musculoskeletal: Degenerative changes of the lumbar spine are noted. IMPRESSION: Right inguinal hernia containing a loop of terminal ileum as well as the cecum. No associated obstruction is noted. Circumferential wall thickening within the rectum which may represent some mild proctitis. Bilateral pleural effusions with associated lower lobe consolidation. Increased soft tissue density is noted within the lower lobe bronchial tree bilaterally which may represent bilateral aspiration.  Electronically Signed   By: Inez Catalina M.D.   On: 08/19/2021 00:58   CT HEAD WO CONTRAST (5MM)  Result Date: 08/19/2021 CLINICAL DATA:  Syncope, simple, normal neuro exam EXAM: CT HEAD WITHOUT CONTRAST TECHNIQUE: Contiguous axial images were obtained from the base of the skull through the vertex without intravenous contrast. COMPARISON:  None. FINDINGS: Brain: There is no acute intracranial hemorrhage, mass effect, or edema. Gray-white differentiation is preserved. There is no extra-axial fluid collection. Prominence of the ventricles and sulci reflects parenchymal volume loss. Patchy and confluent hypoattenuation in the supratentorial white matter is nonspecific but probably reflects moderate chronic microvascular ischemic changes. Vascular: There is atherosclerotic calcification at the skull base. Skull: Calvarium is unremarkable. Sinuses/Orbits: Patchy mucosal thickening. Bilateral lens replacements. Other: None. IMPRESSION: No acute intracranial abnormality. Chronic microvascular ischemic changes.  Electronically Signed   By: Macy Mis M.D.   On: 08/19/2021 16:29   US RENAL  Result Date: 08/20/2021 CLINICAL DATA:  Hematuria EXAM: RENAL / URINARY TRACT ULTRASOUND COMPLETE COMPARISON:  CT abdomen/pelvis dated 08/19/2021 FINDINGS: Right Kidney: Renal measurements: 10.7 x 6.0 x 5.8 cm = volume: 196 mL. Echogenic renal parenchyma. No mass or hydronephrosis visualized. Left Kidney: Renal measurements: 11.2 x 6.8 x 6.5 cm = volume: 257 mL. Echogenic renal parenchyma. 3.0 x 2.3 x 2.5 cm interpolar cyst, simple. No hydronephrosis. Bladder: Layering debris in the bladder. Other: None. IMPRESSION: Layering debris in the bladder. No hydronephrosis. 3.0 cm simple left renal cyst, benign. Electronically Signed   By: Julian Hy M.D.   On: 08/20/2021 20:56   DG Chest Port 1 View  Result Date: 08/18/2021 CLINICAL DATA:  87 year old male with possible sepsis. EXAM: PORTABLE CHEST 1 VIEW COMPARISON:  No priors. FINDINGS: Lung volumes are low. Bibasilar opacities may reflect areas of atelectasis and/or consolidation. Moderate right and small left pleural effusions. No pneumothorax. No evidence of pulmonary edema. Heart size is mildly enlarged. Upper mediastinal contours are within normal limits. Left-sided pacemaker device in place with lead tips projecting over the expected location of the right atrium and right ventricle. IMPRESSION: 1. Bibasilar areas of atelectasis and/or consolidation with superimposed moderate right and small left pleural effusions. 2. Mild cardiomegaly. Electronically Signed   By: Vinnie Langton M.D.   On: 08/18/2021 16:20   EEG adult  Result Date: 08/21/2021 Derek Jack, MD     08/22/2021  2:50 PM Routine EEG Report Jayren Hewett is a 87 y.o. male with a history of spells who is undergoing an EEG to evaluate for seizures. Report: This EEG was acquired with electrodes placed according to the International 10-20 electrode system (including Fp1, Fp2, F3, F4, C3, C4, P3, P4, O1,  O2, T3, T4, T5, T6, A1, A2, Fz, Cz, Pz). The following electrodes were missing or displaced: none. The occipital dominant rhythm was 8.5 Hz. This activity is reactive to stimulation. Drowsiness was manifested by background fragmentation; deeper stages of sleep were identified by K complexes and sleep spindles. There was no focal slowing. There were no interictal epileptiform discharges. There were no electrographic seizures identified. There was no abnormal response to photic stimulation or hyperventilation. Impression: This EEG was obtained while awake and asleep and is normal.   Clinical Correlation: Normal EEGs, however, do not rule out epilepsy. Su Monks, MD [facility] [phone] If 7pm- 7am, please page neurology on call as listed in [place].      Labs: BNP (last 3 results) No results for input(s): BNP in the last 8760 hours. Basic  Metabolic Panel: Recent Labs  Lab 08/19/21 0718 08/20/21 0532 08/21/21 0418 08/23/21 0336 08/24/21 0453  NA 133* 137 139 137 137  K 4.6 4.1 3.9 3.8 4.1  CL 106 110 109 107 108  CO2 20* 21* 24 24 25   GLUCOSE 165* 147* 133* 124* 118*  BUN 62* 58* 50* 36* 28*  CREATININE 2.15* 1.75* 1.59* 1.26* 1.00  CALCIUM 8.0* 8.3* 8.3* 8.3* 8.3*  MG 2.1  --   --   --  2.1  PHOS 5.0*  --   --   --   --    Liver Function Tests: Recent Labs  Lab 08/18/21 1604  AST 43*  ALT 46*  ALKPHOS 81  BILITOT 0.9  PROT 6.2*  ALBUMIN 3.3*   No results for input(s): LIPASE, AMYLASE in the last 168 hours. Recent Labs  Lab 08/19/21 1626  AMMONIA <10   CBC: Recent Labs  Lab 08/18/21 1604 08/19/21 0718 08/19/21 0951 08/21/21 0418 08/23/21 0336 08/24/21 0453  WBC 12.5* 12.0*  --  7.1 6.1 6.6  NEUTROABS 10.8*  --   --   --   --   --   HGB 5.6* 8.3* 8.2* 8.2* 8.4* 8.3*  HCT 18.9* 26.4* 26.1* 26.7* 28.1* 27.8*  MCV 73.3* 77.2*  --  77.2* 77.4* 77.2*  PLT 200 180  --  184 178 166   Cardiac Enzymes: No results for input(s): CKTOTAL, CKMB, CKMBINDEX,  TROPONINI in the last 168 hours. BNP: Invalid input(s): POCBNP CBG: Recent Labs  Lab 08/23/21 1153 08/23/21 1640 08/23/21 2205 08/24/21 0842 08/24/21 1152  GLUCAP 169* 185* 161* 113* 184*   D-Dimer No results for input(s): DDIMER in the last 72 hours. Hgb A1c No results for input(s): HGBA1C in the last 72 hours. Lipid Profile No results for input(s): CHOL, HDL, LDLCALC, TRIG, CHOLHDL, LDLDIRECT in the last 72 hours. Thyroid function studies No results for input(s): TSH, T4TOTAL, T3FREE, THYROIDAB in the last 72 hours.  Invalid input(s): FREET3 Anemia work up Recent Labs    08/23/21 0336 08/23/21 1448  VITAMINB12  --  2,527*  FOLATE 31.0  --   TIBC 370  --   IRON 19*  --    Urinalysis    Component Value Date/Time   COLORURINE RED (A) 08/20/2021 1300   APPEARANCEUR TURBID (A) 08/20/2021 1300   LABSPEC 1.026 08/20/2021 1300   PHURINE  08/20/2021 1300    TEST NOT REPORTED DUE TO COLOR INTERFERENCE OF URINE PIGMENT   GLUCOSEU (A) 08/20/2021 1300    TEST NOT REPORTED DUE TO COLOR INTERFERENCE OF URINE PIGMENT   HGBUR (A) 08/20/2021 1300    TEST NOT REPORTED DUE TO COLOR INTERFERENCE OF URINE PIGMENT   BILIRUBINUR (A) 08/20/2021 1300    TEST NOT REPORTED DUE TO COLOR INTERFERENCE OF URINE PIGMENT   KETONESUR (A) 08/20/2021 1300    TEST NOT REPORTED DUE TO COLOR INTERFERENCE OF URINE PIGMENT   PROTEINUR (A) 08/20/2021 1300    TEST NOT REPORTED DUE TO COLOR INTERFERENCE OF URINE PIGMENT   NITRITE (A) 08/20/2021 1300    TEST NOT REPORTED DUE TO COLOR INTERFERENCE OF URINE PIGMENT   LEUKOCYTESUR (A) 08/20/2021 1300    TEST NOT REPORTED DUE TO COLOR INTERFERENCE OF URINE PIGMENT   Sepsis Labs Invalid input(s): PROCALCITONIN,  WBC,  LACTICIDVEN Microbiology Recent Results (from the past 240 hour(s))  Resp Panel by RT-PCR (Flu A&B, Covid) Nasopharyngeal Swab     Status: None   Collection Time: 08/18/21  4:04 PM  Specimen: Nasopharyngeal Swab; Nasopharyngeal(NP) swabs  in vial transport medium  Result Value Ref Range Status   SARS Coronavirus 2 by RT PCR NEGATIVE NEGATIVE Final    Comment: (NOTE) SARS-CoV-2 target nucleic acids are NOT DETECTED.  The SARS-CoV-2 RNA is generally detectable in upper respiratory specimens during the acute phase of infection. The lowest concentration of SARS-CoV-2 viral copies this assay can detect is 138 copies/mL. A negative result does not preclude SARS-Cov-2 infection and should not be used as the sole basis for treatment or other patient management decisions. A negative result may occur with  improper specimen collection/handling, submission of specimen other than nasopharyngeal swab, presence of viral mutation(s) within the areas targeted by this assay, and inadequate number of viral copies(<138 copies/mL). A negative result must be combined with clinical observations, patient history, and epidemiological information. The expected result is Negative.  Fact Sheet for Patients:  BloggerCourse.comhttps://www.fda.gov/media/152166/download  Fact Sheet for Healthcare Providers:  SeriousBroker.ithttps://www.fda.gov/media/152162/download  This test is no t yet approved or cleared by the [place] FDA and  has been authorized for detection and/or diagnosis of SARS-CoV-2 by FDA under an Emergency Use Authorization (EUA). This EUA will remain  in effect (meaning this test can be used) for the duration of the COVID-19 declaration under Section 564(b)(1) of the Act, 21 U.S.C.section 360bbb-3(b)(1), unless the authorization is terminated  or revoked sooner.       Influenza A by PCR NEGATIVE NEGATIVE Final   Influenza B by PCR NEGATIVE NEGATIVE Final    Comment: (NOTE) The Xpert Xpress SARS-CoV-2/FLU/RSV plus assay is intended as an aid in the diagnosis of influenza from Nasopharyngeal swab specimens and should not be used as a sole basis for treatment. Nasal washings and aspirates are unacceptable for Xpert Xpress  SARS-CoV-2/FLU/RSV testing.  Fact Sheet for Patients: BloggerCourse.comhttps://www.fda.gov/media/152166/download  Fact Sheet for Healthcare Providers: SeriousBroker.ithttps://www.fda.gov/media/152162/download  This test is not yet approved or cleared by the [place] FDA and has been authorized for detection and/or diagnosis of SARS-CoV-2 by FDA under an Emergency Use Authorization (EUA). This EUA will remain in effect (meaning this test can be used) for the duration of the COVID-19 declaration under Section 564(b)(1) of the Act, 21 U.S.C. section 360bbb-3(b)(1), unless the authorization is terminated or revoked.  Performed at [facility], [address] [place], [place] [address]   Blood Culture (routine x 2)     Status: None   Collection Time: 08/18/21  4:04 PM   Specimen: BLOOD  Result Value Ref Range Status   Specimen Description BLOOD LEFT ANTECUBITAL  Final   Special Requests   Final    BOTTLES DRAWN AEROBIC AND ANAEROBIC Blood Culture adequate volume   Culture   Final    NO GROWTH 5 DAYS Performed at [facility], [address] [place], [place] [address]    Report Status 08/23/2021 FINAL  Final  Blood Culture (routine x 2)     Status: None   Collection Time: 08/18/21  5:57 PM   Specimen: BLOOD  Result Value Ref Range Status   Specimen Description BLOOD BLOOD RIGHT FOREARM  Final   Special Requests   Final    BOTTLES DRAWN AEROBIC AND ANAEROBIC Blood Culture adequate volume   Culture   Final    NO GROWTH 5 DAYS Performed at [facility], [address] [place], [place] [address]    Report Status 08/23/2021 FINAL  Final  MRSA Next Gen by PCR, Nasal     Status: None  Collection Time: 08/18/21  8:31 PM   Specimen: Nasal Mucosa; Nasal Swab  Result Value Ref Range Status   MRSA by PCR Next Gen NOT DETECTED NOT DETECTED Final    Comment: (NOTE) The GeneXpert MRSA Assay (FDA approved for NASAL specimens only), is one component of a comprehensive MRSA  colonization surveillance program. It is not intended to diagnose MRSA infection nor to guide or monitor treatment for MRSA infections. Test performance is not FDA approved in patients less than 74 years old. Performed at [facility], [address], [place], [place] [address]   Urine Culture     Status: None   Collection Time: 08/20/21  1:00 PM   Specimen: Urine, Random  Result Value Ref Range Status   Specimen Description   Final    URINE, RANDOM Performed at [facility], [address] [place], [place] [address]    Special Requests   Final    NONE Performed at [facility], [address], [place], [place] [address]    Culture   Final    NO GROWTH Performed at [facility], [place] [address] [place], [place] [address]    Report Status 08/22/2021 FINAL  Final  Resp Panel by RT-PCR (Flu A&B, Covid) Nasopharyngeal Swab     Status: None   Collection Time: 08/24/21 12:41 PM   Specimen: Nasopharyngeal Swab; Nasopharyngeal(NP) swabs in vial transport medium  Result Value Ref Range Status   SARS Coronavirus 2 by RT PCR NEGATIVE NEGATIVE Final    Comment: (NOTE) SARS-CoV-2 target nucleic acids are NOT DETECTED.  The SARS-CoV-2 RNA is generally detectable in upper respiratory specimens during the acute phase of infection. The lowest concentration of SARS-CoV-2 viral copies this assay can detect is 138 copies/mL. A negative result does not preclude SARS-Cov-2 infection and should not be used as the sole basis for treatment or other patient management decisions. A negative result may occur with  improper specimen collection/handling, submission of specimen other than nasopharyngeal swab, presence of viral mutation(s) within the areas targeted by this assay, and inadequate number of viral copies(<138 copies/mL). A negative result must be combined with clinical observations, patient history, and epidemiological information. The expected result is  Negative.  Fact Sheet for Patients:  EntrepreneurPulse.com.au  Fact Sheet for Healthcare Providers:  IncredibleEmployment.be  This test is no t yet approved or cleared by the [place] FDA and  has been authorized for detection and/or diagnosis of SARS-CoV-2 by FDA under an Emergency Use Authorization (EUA). This EUA will remain  in effect (meaning this test can be used) for the duration of the COVID-19 declaration under Section 564(b)(1) of the Act, 21 U.S.C.section 360bbb-3(b)(1), unless the authorization is terminated  or revoked sooner.       Influenza A by PCR NEGATIVE NEGATIVE Final   Influenza B by PCR NEGATIVE NEGATIVE Final    Comment: (NOTE) The Xpert Xpress SARS-CoV-2/FLU/RSV plus assay is intended as an aid in the diagnosis of influenza from Nasopharyngeal swab specimens and should not be used as a sole basis for treatment. Nasal washings and aspirates are unacceptable for Xpert Xpress SARS-CoV-2/FLU/RSV testing.  Fact Sheet for Patients: EntrepreneurPulse.com.au  Fact Sheet for Healthcare Providers: IncredibleEmployment.be  This test is not yet approved or cleared by the [place] FDA and has been authorized for detection and/or diagnosis of SARS-CoV-2 by FDA under an Emergency Use Authorization (EUA). This EUA will remain in effect (meaning this test can be used) for the duration of  the COVID-19 declaration under Section 564(b)(1) of the Act, 21 U.S.C. section 360bbb-3(b)(1), unless the authorization is terminated or revoked.  Performed at [facility], [address] [place], [place] [address]      Total time spend on discharging this patient, including the last patient exam, discussing the hospital stay, instructions for ongoing care as it relates to all pertinent caregivers, as well as preparing the medical discharge records, prescriptions, and/or referrals as  applicable, is 45 minutes.    Enzo Bi, MD  [facility] 08/24/2021, 1:56 PM

## 2021-08-24 NOTE — Progress Notes (Signed)
Report called to Alyson Ingles LPN at [facility] patient coing to room 605 A

## 2021-08-24 NOTE — Progress Notes (Signed)
Physical Therapy Treatment Patient Details Name: [name] [name] MRN: [id number] DOB: [birth year] Today's Date: 08/24/2021   History of Present Illness 87 yo M presenting to [facility] ED on 08/18/21 via EMS from [facility] with complaints of possible seizure activity. No history of seizures. PMH includes: DM, HTN, urinary incontinence.    PT Comments    Pt received supine in bed. Agreeable to PT. Able to transfer to EOB with supervision but requires minguard to stand to RW with LE's being very shaky and increased time to reach upright posture. Tolerated ambulating 12' with flexed posture reporting need to sit. Recliner provided. Pt reports fatigue and SOB. HR at rest at 55 BPM and post amb at 59 BPM. SPO2 remained 94% on RA. No dizziness reported today however. RN aware of soiled linens in bed and pt requesting to "get cleaned up". Pt remains limited in strength/endurance with ambulation and safety deficits with transfers.  Will benefit from STR for these deficits prior to return to [facility] ALF.   Recommendations for follow up therapy are one component of a multi-disciplinary discharge planning process, led by the attending physician.  Recommendations may be updated based on patient status, additional functional criteria and insurance authorization.  Follow Up Recommendations  Skilled nursing-short term rehab (<3 hours/day)     Assistance Recommended at Discharge Intermittent Supervision/Assistance  Equipment Recommendations  None recommended by PT    Recommendations for Other Services       Precautions / Restrictions Precautions Precautions: Fall Restrictions Weight Bearing Restrictions: No     Mobility  Bed Mobility Overal bed mobility: Needs Assistance Bed Mobility: Supine to Sit     Supine to sit: Supervision;HOB elevated     General bed mobility comments: NT up in recliner Patient Response: Cooperative  Transfers Overall transfer level: Needs assistance Equipment used:  Rolling walker (2 wheels) Transfers: Sit to/from Stand Sit to Stand: Min guard           General transfer comment: Very unsteady with standing to RW    Ambulation/Gait Ambulation/Gait assistance: Min guard Gait Distance (Feet): 12 Feet Assistive device: Rolling walker (2 wheels) Gait Pattern/deviations: Step-through pattern;Decreased dorsiflexion - left;Decreased dorsiflexion - right       General Gait Details: Heavy UE use on RW with increased hip and knee flexion to clear LE's in swing phase due to hx of B foot drop   Stairs             Wheelchair Mobility    Modified Rankin (Stroke Patients Only)       Balance Overall balance assessment: Needs assistance Sitting-balance support: Feet supported;No upper extremity supported Sitting balance-Leahy Scale: Fair     Standing balance support: Reliant on assistive device for balance;Bilateral upper extremity supported;During functional activity Standing balance-Leahy Scale: Fair Standing balance comment: Does rely on UE's on RW for support.                            Cognition Arousal/Alertness: Awake/alert Behavior During Therapy: WFL for tasks assessed/performed Overall Cognitive Status: No family/caregiver present to determine baseline cognitive functioning                                          Exercises      General Comments        Pertinent Vitals/Pain Pain Assessment: No/denies pain  Home Living                          Prior Function            PT Goals (current goals can now be found in the care plan section) Acute Rehab PT Goals Patient Stated Goal: to return to [facility] PT Goal Formulation: With patient Time For Goal Achievement: 09/04/21 Potential to Achieve Goals: Good Progress towards PT goals: Progressing toward goals    Frequency    Min 2X/week      PT Plan Current plan remains appropriate    Co-evaluation               AM-PAC PT "6 Clicks" Mobility   Outcome Measure  Help needed turning from your back to your side while in a flat bed without using bedrails?: A Little Help needed moving from lying on your back to sitting on the side of a flat bed without using bedrails?: A Little Help needed moving to and from a bed to a chair (including a wheelchair)?: A Little   Help needed to walk in hospital room?: A Little Help needed climbing 3-5 steps with a railing? : A Lot 6 Click Score: 14    End of Session Equipment Utilized During Treatment: Gait belt Activity Tolerance: Patient tolerated treatment well;Patient limited by fatigue Patient left: in chair;with call bell/phone within reach;with chair alarm set Nurse Communication: Mobility status PT Visit Diagnosis: Other abnormalities of gait and mobility (R26.89);Muscle weakness (generalized) (M62.81);Difficulty in walking, not elsewhere classified (R26.2)     Time: 3299-2426 PT Time Calculation (min) (ACUTE ONLY): 22 min  Charges:  $Therapeutic Activity: 8-22 mins                    Maelin Kurkowski M. Fairly IV, PT, DPT Physical Therapist- [facility]  08/24/2021, 12:34 PM

## 2021-08-24 NOTE — TOC Progression Note (Signed)
Transition of Care [facility]) - Progression Note    Patient Details  Name: [name] [name] MRN: [id number] Date of Birth: 1927-12-30  [facility] CM/SW Contact  Caryn Section, RN Phone Number: 08/24/2021, 2:23 PM  Clinical Narrative:   [facility] room [facility]A.  EMS contacted to transport.  Patient and family aware.    Expected Discharge Plan: Skilled Nursing Facility Barriers to Discharge: Continued Medical Work up  Expected Discharge Plan and Services Expected Discharge Plan: Skilled Nursing Facility   Discharge Planning Services: CM Consult Post Acute Care Choice: Skilled Nursing Facility Living arrangements for the past 2 months: Assisted Living Facility Expected Discharge Date: 08/24/21                                     Social Determinants of Health (SDOH) Interventions    Readmission Risk Interventions No flowsheet data found.

## 2021-08-24 NOTE — Care Management Important Message (Signed)
Important Message  Patient Details  Name: [name] [name] MRN: [id number] Date of Birth: [birth year]   Medicare Important Message Given:  Yes  I had left a message for HCPOA [name] [phone]) and she returned my call before I entered my note.  I reviewed the Important Message from Medicare with her and she is in agreement with the discharge plan. I thanked her for returning my call.   [name] A [place] 08/24/2021, 2:32 PM

## 2021-08-24 NOTE — Progress Notes (Signed)
Occupational Therapy Treatment Patient Details Name: [name] [name] MRN: [id number] DOB: [birth year] Today's Date: 08/24/2021   History of present illness 87 yo M presenting to [facility] ED on 08/18/21 via EMS from [facility] with complaints of possible seizure activity. No history of seizures. PMH includes: DM, HTN, urinary incontinence.   OT comments  Pt seen for OT tx. Pt received in recliner. Pt reports just having gotten up with therapy recently and declines to perform additional transfers for standing grooming tasks. Pt educated in benefits of ADL/mobility to maintain/improve strength, activity tolerance, and balance. Pt verbalized understanding but continued to politely decline at this time. Pt set up for washing face, brushing teeth, and combing his hair. He required MIN A to complete combing due to tangles. OT facilitated review of SLP's instructions to minimize aspiration risk and pt able to correctly follow instructions as written. Pt endorses feeling a bit better today and looking forward to his children's visit later today. Pt progressing slowly, continues to benefit from skilled OT Services. Continue to recommend short term rehab at SNF at this time.    Recommendations for follow up therapy are one component of a multi-disciplinary discharge planning process, led by the attending physician.  Recommendations may be updated based on patient status, additional functional criteria and insurance authorization.    Follow Up Recommendations  Skilled nursing-short term rehab (<3 hours/day)    Assistance Recommended at Discharge Intermittent Supervision/Assistance  Equipment Recommendations  BSC/3in1    Recommendations for Other Services      Precautions / Restrictions Precautions Precautions: Fall Restrictions Weight Bearing Restrictions: No       Mobility Bed Mobility               General bed mobility comments: NT up in recliner    Transfers                    General transfer comment: pt reports having just gotten up and declines to stand from recliner for grooming tasks     Balance                                           ADL either performed or assessed with clinical judgement   ADL Overall ADL's : Needs assistance/impaired     Grooming: Sitting;Wash/dry face;Oral care;Brushing hair;Set up;Minimal assistance Grooming Details (indicate cue type and reason): set up for brushing teeth, Min A to comb hair (still had glue in hair from EEG)                                    Extremity/Trunk Assessment              Vision       Perception     Praxis      Cognition Arousal/Alertness: Awake/alert Behavior During Therapy: WFL for tasks assessed/performed Overall Cognitive Status: No family/caregiver present to determine baseline cognitive functioning                                            Exercises     Shoulder Instructions       General Comments      Pertinent Vitals/ Pain  Home Living                                          Prior Functioning/Environment              Frequency  Min 2X/week        Progress Toward Goals  OT Goals(current goals can now be found in the care plan section)  Progress towards OT goals: Progressing toward goals  Acute Rehab OT Goals Patient Stated Goal: feel better and go back to ALF OT Goal Formulation: With patient Time For Goal Achievement: 09/05/21 Potential to Achieve Goals: Good  Plan Discharge plan remains appropriate;Frequency remains appropriate    Co-evaluation                 AM-PAC OT "6 Clicks" Daily Activity     Outcome Measure   Help from another person eating meals?: None Help from another person taking care of personal grooming?: None Help from another person toileting, which includes using toliet, bedpan, or urinal?: A Little Help from another person bathing  (including washing, rinsing, drying)?: A Little Help from another person to put on and taking off regular upper body clothing?: None Help from another person to put on and taking off regular lower body clothing?: A Little 6 Click Score: 21    End of Session    OT Visit Diagnosis: Other abnormalities of gait and mobility (R26.89);History of falling (Z91.81);Muscle weakness (generalized) (M62.81)   Activity Tolerance Patient tolerated treatment well   Patient Left in chair;with call bell/phone within reach;with chair alarm set   Nurse Communication          Time: 6734-1937 OT Time Calculation (min): 17 min  Charges: OT General Charges $OT Visit: 1 Visit OT Treatments $Self Care/Home Management : 8-22 mins  Arman Filter., MPH, MS, OTR/L ascom [phone] 08/24/21, 11:35 AM

## 2021-09-12 ENCOUNTER — Other Ambulatory Visit: Payer: Self-pay

## 2021-09-12 ENCOUNTER — Inpatient Hospital Stay
Admission: EM | Admit: 2021-09-12 | Discharge: 2021-09-15 | DRG: 194 | Disposition: A | Discharge status: Skilled Nursing Facility | Payer: Medicare Other | Attending: Internal Medicine | Admitting: Internal Medicine

## 2021-09-12 ENCOUNTER — Emergency Department: Payer: Medicare Other

## 2021-09-12 DIAGNOSIS — R911 Solitary pulmonary nodule: Secondary | ICD-10-CM | POA: Diagnosis present

## 2021-09-12 DIAGNOSIS — I700 Atherosclerosis of aorta: Secondary | ICD-10-CM | POA: Diagnosis present

## 2021-09-12 DIAGNOSIS — I2510 Atherosclerotic heart disease of native coronary artery without angina pectoris: Secondary | ICD-10-CM | POA: Diagnosis present

## 2021-09-12 DIAGNOSIS — L89152 Pressure ulcer of sacral region, stage 2: Secondary | ICD-10-CM | POA: Diagnosis present

## 2021-09-12 DIAGNOSIS — Z888 Allergy status to other drugs, medicaments and biological substances status: Secondary | ICD-10-CM

## 2021-09-12 DIAGNOSIS — D649 Anemia, unspecified: Secondary | ICD-10-CM | POA: Diagnosis present

## 2021-09-12 DIAGNOSIS — Z87891 Personal history of nicotine dependence: Secondary | ICD-10-CM

## 2021-09-12 DIAGNOSIS — Z7984 Long term (current) use of oral hypoglycemic drugs: Secondary | ICD-10-CM

## 2021-09-12 DIAGNOSIS — I959 Hypotension, unspecified: Secondary | ICD-10-CM | POA: Diagnosis present

## 2021-09-12 DIAGNOSIS — I495 Sick sinus syndrome: Secondary | ICD-10-CM | POA: Diagnosis present

## 2021-09-12 DIAGNOSIS — E1169 Type 2 diabetes mellitus with other specified complication: Secondary | ICD-10-CM | POA: Diagnosis present

## 2021-09-12 DIAGNOSIS — E039 Hypothyroidism, unspecified: Secondary | ICD-10-CM | POA: Diagnosis present

## 2021-09-12 DIAGNOSIS — E119 Type 2 diabetes mellitus without complications: Secondary | ICD-10-CM

## 2021-09-12 DIAGNOSIS — N179 Acute kidney failure, unspecified: Secondary | ICD-10-CM | POA: Diagnosis present

## 2021-09-12 DIAGNOSIS — Z87898 Personal history of other specified conditions: Secondary | ICD-10-CM

## 2021-09-12 DIAGNOSIS — R42 Dizziness and giddiness: Secondary | ICD-10-CM | POA: Diagnosis not present

## 2021-09-12 DIAGNOSIS — I9589 Other hypotension: Secondary | ICD-10-CM

## 2021-09-12 DIAGNOSIS — Z9049 Acquired absence of other specified parts of digestive tract: Secondary | ICD-10-CM

## 2021-09-12 DIAGNOSIS — J189 Pneumonia, unspecified organism: Principal | ICD-10-CM | POA: Diagnosis present

## 2021-09-12 DIAGNOSIS — D509 Iron deficiency anemia, unspecified: Secondary | ICD-10-CM | POA: Diagnosis not present

## 2021-09-12 DIAGNOSIS — M86662 Other chronic osteomyelitis, left tibia and fibula: Secondary | ICD-10-CM | POA: Diagnosis present

## 2021-09-12 DIAGNOSIS — L8990 Pressure ulcer of unspecified site, unspecified stage: Secondary | ICD-10-CM | POA: Insufficient documentation

## 2021-09-12 DIAGNOSIS — E1151 Type 2 diabetes mellitus with diabetic peripheral angiopathy without gangrene: Secondary | ICD-10-CM | POA: Diagnosis present

## 2021-09-12 DIAGNOSIS — Z79890 Hormone replacement therapy (postmenopausal): Secondary | ICD-10-CM

## 2021-09-12 DIAGNOSIS — Z7901 Long term (current) use of anticoagulants: Secondary | ICD-10-CM

## 2021-09-12 DIAGNOSIS — D500 Iron deficiency anemia secondary to blood loss (chronic): Secondary | ICD-10-CM | POA: Diagnosis present

## 2021-09-12 DIAGNOSIS — H538 Other visual disturbances: Secondary | ICD-10-CM | POA: Diagnosis present

## 2021-09-12 DIAGNOSIS — Z950 Presence of cardiac pacemaker: Secondary | ICD-10-CM

## 2021-09-12 DIAGNOSIS — E785 Hyperlipidemia, unspecified: Secondary | ICD-10-CM | POA: Diagnosis present

## 2021-09-12 DIAGNOSIS — I482 Chronic atrial fibrillation, unspecified: Secondary | ICD-10-CM | POA: Diagnosis present

## 2021-09-12 DIAGNOSIS — E860 Dehydration: Secondary | ICD-10-CM | POA: Diagnosis present

## 2021-09-12 DIAGNOSIS — Z20822 Contact with and (suspected) exposure to covid-19: Secondary | ICD-10-CM | POA: Diagnosis present

## 2021-09-12 DIAGNOSIS — I4891 Unspecified atrial fibrillation: Secondary | ICD-10-CM | POA: Diagnosis not present

## 2021-09-12 DIAGNOSIS — R0902 Hypoxemia: Secondary | ICD-10-CM | POA: Diagnosis present

## 2021-09-12 DIAGNOSIS — E861 Hypovolemia: Secondary | ICD-10-CM | POA: Diagnosis present

## 2021-09-12 DIAGNOSIS — H9190 Unspecified hearing loss, unspecified ear: Secondary | ICD-10-CM | POA: Diagnosis present

## 2021-09-12 DIAGNOSIS — R531 Weakness: Secondary | ICD-10-CM | POA: Diagnosis not present

## 2021-09-12 DIAGNOSIS — I10 Essential (primary) hypertension: Secondary | ICD-10-CM | POA: Diagnosis present

## 2021-09-12 DIAGNOSIS — Z79899 Other long term (current) drug therapy: Secondary | ICD-10-CM

## 2021-09-12 DIAGNOSIS — E1165 Type 2 diabetes mellitus with hyperglycemia: Secondary | ICD-10-CM | POA: Diagnosis present

## 2021-09-12 DIAGNOSIS — Z66 Do not resuscitate: Secondary | ICD-10-CM | POA: Diagnosis present

## 2021-09-12 LAB — CBC
HCT: 34.3 % — ABNORMAL LOW (ref 39.0–52.0)
Hemoglobin: 10.4 g/dL — ABNORMAL LOW (ref 13.0–17.0)
MCH: 23.5 pg — ABNORMAL LOW (ref 26.0–34.0)
MCHC: 30.3 g/dL (ref 30.0–36.0)
MCV: 77.4 fL — ABNORMAL LOW (ref 80.0–100.0)
Platelets: 412 10*3/uL — ABNORMAL HIGH (ref 150–400)
RBC: 4.43 MIL/uL (ref 4.22–5.81)
RDW: 22.8 % — ABNORMAL HIGH (ref 11.5–15.5)
WBC: 17.5 10*3/uL — ABNORMAL HIGH (ref 4.0–10.5)
nRBC: 0 % (ref 0.0–0.2)

## 2021-09-12 LAB — LACTIC ACID, PLASMA
Lactic Acid, Venous: 1.7 mmol/L (ref 0.5–1.9)
Lactic Acid, Venous: 1.4 mmol/L (ref 0.5–1.9)

## 2021-09-12 LAB — BASIC METABOLIC PANEL
Anion gap: 7 (ref 5–15)
BUN: 29 mg/dL — ABNORMAL HIGH (ref 8–23)
CO2: 28 mmol/L (ref 22–32)
Calcium: 8.6 mg/dL — ABNORMAL LOW (ref 8.9–10.3)
Chloride: 101 mmol/L (ref 98–111)
Creatinine, Ser: 1.4 mg/dL — ABNORMAL HIGH (ref 0.61–1.24)
GFR, Estimated: 47 mL/min — ABNORMAL LOW (ref >60)
Glucose, Bld: 205 mg/dL — ABNORMAL HIGH (ref 70–99)
Potassium: 4 mmol/L (ref 3.5–5.1)
Sodium: 136 mmol/L (ref 135–145)

## 2021-09-12 LAB — RESP PANEL BY RT-PCR (FLU A&B, COVID) ARPGX2
Influenza A by PCR: NEGATIVE
Influenza B by PCR: NEGATIVE
SARS Coronavirus 2 by RT PCR: NEGATIVE

## 2021-09-12 LAB — PROCALCITONIN: Procalcitonin: 0.1 ng/mL

## 2021-09-12 LAB — TROPONIN I (HIGH SENSITIVITY): Troponin I (High Sensitivity): 52 ng/L — ABNORMAL HIGH (ref <18)

## 2021-09-12 MED ORDER — VANCOMYCIN HCL IN DEXTROSE 1-5 GM/200ML-% IV SOLN
1000.0000 mg | Freq: Once | INTRAVENOUS | Status: AC
  Administered 2021-09-12: 21:00:00 1000 mg via INTRAVENOUS
  Filled 2021-09-12: qty 200

## 2021-09-12 MED ORDER — SODIUM CHLORIDE 0.9 % IV SOLN: INTRAVENOUS | Status: DC

## 2021-09-12 MED ORDER — TAMSULOSIN HCL 0.4 MG PO CAPS
0.4000 mg | ORAL_CAPSULE | Freq: Every day | ORAL | Status: DC
  Administered 2021-09-12 – 2021-09-14 (×3): 0.4 mg via ORAL
  Filled 2021-09-12 (×3): qty 1

## 2021-09-12 MED ORDER — HYDRALAZINE HCL 20 MG/ML IJ SOLN: 10.0000 mg | Freq: Four times a day (QID) | INTRAMUSCULAR | Status: DC | PRN

## 2021-09-12 MED ORDER — ACETAMINOPHEN 650 MG RE SUPP: 650.0000 mg | Freq: Four times a day (QID) | RECTAL | Status: DC | PRN

## 2021-09-12 MED ORDER — ACETAMINOPHEN 325 MG PO TABS: 650.0000 mg | ORAL_TABLET | Freq: Four times a day (QID) | ORAL | Status: DC | PRN

## 2021-09-12 MED ORDER — SODIUM CHLORIDE 0.9 % IV SOLN: Freq: Once | INTRAVENOUS | Status: AC

## 2021-09-12 MED ORDER — FINASTERIDE 5 MG PO TABS
5.0000 mg | ORAL_TABLET | Freq: Every day | ORAL | Status: DC
  Administered 2021-09-13 – 2021-09-15 (×3): 5 mg via ORAL
  Filled 2021-09-12 (×3): qty 1

## 2021-09-12 MED ORDER — SODIUM CHLORIDE 0.9 % IV BOLUS
1000.0000 mL | Freq: Once | INTRAVENOUS | Status: AC
  Administered 2021-09-12: 16:00:00 1000 mL via INTRAVENOUS

## 2021-09-12 MED ORDER — SODIUM CHLORIDE 0.9% FLUSH
3.0000 mL | Freq: Two times a day (BID) | INTRAVENOUS | Status: DC
  Administered 2021-09-12 – 2021-09-15 (×5): 3 mL via INTRAVENOUS

## 2021-09-12 MED ORDER — CEFEPIME HCL 2 G IJ SOLR
2.0000 g | Freq: Two times a day (BID) | INTRAMUSCULAR | Status: DC
  Filled 2021-09-12 (×2): qty 2

## 2021-09-12 MED ORDER — LEVOTHYROXINE SODIUM 100 MCG PO TABS
125.0000 ug | ORAL_TABLET | Freq: Every day | ORAL | Status: DC
  Administered 2021-09-14 – 2021-09-15 (×2): 125 ug via ORAL
  Filled 2021-09-12 (×3): qty 1

## 2021-09-12 MED ORDER — VANCOMYCIN HCL IN DEXTROSE 1-5 GM/200ML-% IV SOLN
1000.0000 mg | Freq: Once | INTRAVENOUS | Status: AC
  Administered 2021-09-12: 1000 mg via INTRAVENOUS
  Filled 2021-09-12: qty 200

## 2021-09-12 MED ORDER — SODIUM CHLORIDE 0.9 % IV SOLN
2.0000 g | Freq: Once | INTRAVENOUS | Status: AC
  Administered 2021-09-12: 20:00:00 2 g via INTRAVENOUS
  Filled 2021-09-12: qty 2

## 2021-09-12 NOTE — ED Triage Notes (Signed)
Pt to [facility] for dizziness in the mornings upon standing. Pt states he is not dizzy right now. HOH. Answers orientation questions appropriately.

## 2021-09-12 NOTE — Consult Note (Signed)
Pharmacy Antibiotic Note  [name] [name] is a 87 y.o. male admitted on 09/12/2021 with HCAP.  Pharmacy has been consulted for Vancomycin & Cefepime dosing.  Plan: Cefepime 2g q12h per current renal fxn. Vancomycin IV loading dose of 2g (1g+1g in ED) Current AUC calculation predicts 36-48hr dosing interval. Will follow up with AM labs to assess regimen. Ordered MRSA PCR f/u result and cultures to assist in targeted therapy  Height: 5\' 11"  (180.3 cm) Weight: 92 kg (202 lb 13.2 oz) IBW/kg (Calculated) : 75.3  Temp (24hrs), Avg:98.4 F (36.9 C), Min:98.4 F (36.9 C), Max:98.4 F (36.9 C)  Recent Labs  Lab 09/12/21 1519 09/12/21 2005  WBC 17.5*  --   CREATININE 1.40*  --   LATICACIDVEN 1.7 1.4    Estimated Creatinine Clearance: 39.9 mL/min (A) (by C-G formula based on SCr of 1.4 mg/dL (H)).    Allergies  Allergen Reactions   Statins Palpitations    Antimicrobials this admission: Vancomycin & Cefepime (12/27 >>   Dose adjustments this admission: CTM renal fxn and adjust PRN  Microbiology results: 12/27 BCx: sent 12/27 Cov/Flu: negative 12/27 MRSA PCR: ordered  Thank you for allowing pharmacy to be a part of this patients care.  1/28 [name] [name] 09/12/2021 10:03 PM

## 2021-09-12 NOTE — ED Provider Notes (Signed)
[facility] Emergency Department Provider Note  ____________________________________________   I have reviewed the triage vital signs and the nursing notes.   HISTORY  Chief Complaint Vision change   History limited by: Not Limited   HPI [name] [name] is a 87 y.o. male who presents to the emergency department today from [facility] because of concerns for episode of vision change that occurred this morning.  Patient states it happened when he was first getting up.  Stated everything went blurry.  This has been happening every morning over the past week.  He states that the nursing staff at [facility] has been working to try to help with this problem.  It is unclear if anything was different today that made him call 911.  Patient does state that part of the issue is that he was told his oxygen levels go low when this happens although he denies any associated shortness of breath.  No chest pain. Denies any recent fevers or illness.   Records reviewed. Per medical record review patient has a history of DM, atrial fibrillation, HLD, HTN.  Past Medical History:  Diagnosis Date   Atrial fibrillation ([place])    Diabetes mellitus without complication ([place])    Hyperlipidemia    Hypertension     Patient Active Problem List   Diagnosis Date Noted   Sepsis with acute hypoxic respiratory failure and septic shock (HCC)    Type 2 diabetes mellitus with hyperglycemia ([place])    Acute renal failure with tubular necrosis (HCC)    Shock ([place]) 08/18/2021   Symptomatic anemia    Gross hematuria 09/10/2020   Diabetes mellitus without complication (HCC)    Hypertension    PAD (peripheral artery disease) (HCC)    Osteomyelitis, chronic, lower leg, left (HCC)    Hypothyroid    Lung nodule seen on imaging study     Past Surgical History:  Procedure Laterality Date   APPENDECTOMY     CHOLECYSTECTOMY     TONSILLECTOMY      Prior to Admission medications   Medication  Sig Start Date End Date Taking? Authorizing Provider  acetaminophen (TYLENOL) 500 MG tablet Take 500 mg by mouth every 8 (eight) hours as needed for mild pain.    [provider]  apixaban (ELIQUIS) 5 MG TABS tablet Hold until followup with PCP due to drop in hemoglobin and suspected occult bleed. 08/24/21   Enzo Bi, MD  Cholecalciferol 25 MCG (1000 UT) tablet Take 1,000 mg by mouth daily. 08/24/20   [provider]  cyanocobalamin 1000 MCG tablet Take 1,000 mcg by mouth daily.    [provider]  diltiazem (TIAZAC) 180 MG 24 hr capsule Hold until outpatient followup due to normal blood pressure without it. 08/24/21   Enzo Bi, MD  ezetimibe (ZETIA) 10 MG tablet Take 10 mg by mouth daily. 08/09/20   [provider]  ferrous sulfate 325 (65 FE) MG tablet Take 1 tablet (325 mg total) by mouth daily with breakfast. 08/25/21   Enzo Bi, MD  finasteride (PROSCAR) 5 MG tablet Take 5 mg by mouth daily. 08/24/20   [provider]  furosemide (LASIX) 20 MG tablet Hold until outpatient followup due to blood pressure normal without it. 08/24/21   Enzo Bi, MD  gabapentin (NEURONTIN) 300 MG capsule Take 300 mg by mouth at bedtime.    [provider]  levothyroxine (SYNTHROID) 125 MCG tablet Take 125 mcg by mouth daily before breakfast.    [provider]  Multiple Vitamin (MULTIVITAMIN WITH MINERALS) TABS tablet Take 1 tablet by mouth daily.    [provider]  pantoprazole (PROTONIX) 40 MG tablet Take 1 tablet (40 mg total) by mouth daily. 08/25/21   Darlin Priestly, MD  psyllium (METAMUCIL SMOOTH TEXTURE) 58.6 % powder Take 1 packet by mouth every evening.    [provider]  sitaGLIPtin (JANUVIA) 100 MG tablet Take 100 mg by mouth daily.    [provider]  Skin Protectants, Misc. (CALAZIME SKIN PROTECTANT EX) Apply 1 application topically as needed (periwound area).    [provider]  tamsulosin (FLOMAX) 0.4 MG CAPS  capsule Take 0.4 mg by mouth at bedtime. 08/09/20   [provider]    Allergies Statins  No family history on file.  Social History Social History   Tobacco Use   Smoking status: Former   Smokeless tobacco: Never  Building services engineer Use: Never used  Substance Use Topics   Alcohol use: Never   Drug use: Never    Review of Systems Constitutional: No fever/chills Eyes: Positive for vision change. ENT: No sore throat. Cardiovascular: Denies chest pain. Respiratory: Denies shortness of breath. Gastrointestinal: No abdominal pain.  No nausea, no vomiting.  No diarrhea.   Genitourinary: Negative for dysuria. Musculoskeletal: Negative for back pain. Skin: Negative for rash. Neurological: Negative for headaches, focal weakness or numbness.  ____________________________________________   PHYSICAL EXAM:  VITAL SIGNS: ED Triage Vitals  Enc Vitals Group     BP 09/12/21 1518 (!) 74/41     Pulse Rate 09/12/21 1518 62     Resp 09/12/21 1517 18     Temp 09/12/21 1521 98.4 F (36.9 C)     Temp src --      SpO2 09/12/21 1518 98 %     Weight 09/12/21 1518 202 lb 13.2 oz (92 kg)     Height 09/12/21 1518 5\' 11"  (1.803 m)     Head Circumference --      [facility] Flow --      Pain Score 09/12/21 1517 0   Constitutional: Alert and oriented.  Eyes: Conjunctivae are normal.  ENT      Head: Normocephalic and atraumatic.      Nose: No congestion/rhinnorhea.      Mouth/Throat: Mucous membranes are moist.      Neck: No stridor. Hematological/Lymphatic/Immunilogical: No cervical lymphadenopathy. Cardiovascular: Normal rate, regular rhythm.  No murmurs, rubs, or gallops.  Respiratory: Normal respiratory effort without tachypnea nor retractions. Breath sounds are clear and equal bilaterally. No wheezes/rales/rhonchi. Gastrointestinal: Soft and non tender. No rebound. No guarding.  Genitourinary: Deferred Musculoskeletal: Normal range of motion in all extremities. No lower  extremity edema. Neurologic:  Normal speech and language. No gross focal neurologic deficits are appreciated.  Skin:  Skin is warm, dry and intact. No rash noted. Psychiatric: Mood and affect are normal. Speech and behavior are normal. Patient exhibits appropriate insight and judgment.  ____________________________________________    LABS (pertinent positives/negatives)  Lactic acid 1.7 BMP na 136, k 4.0, glu 205, cr 1.40 CBC wbc 17.5, hgb 10.4, plt 412  ____________________________________________   EKG  I, 09/14/21, attending physician, personally viewed and interpreted this EKG  EKG Time: 1527 Rate: 67 Rhythm: atrial and ventricular paced rhythm Axis: left axis deviation Intervals: qtc 475 QRS: RBBB ST changes: no st elevation Impression: abnormal ekg ____________________________________________    RADIOLOGY  CXR Bilateral pleural effusions with hazy atelectasis vs pneumonia  ____________________________________________   PROCEDURES  Procedures  ____________________________________________   INITIAL IMPRESSION / ASSESSMENT AND PLAN / ED COURSE  Pertinent labs & imaging results that were available during my care of the patient were reviewed by me and considered in my medical decision making (see chart for details).   Patient presented to the emergency department today from [facility] because of concern for apparent low oxygen levels. Patient describes having his vision start to blur with sitting up. Patient without hypoxia here. Was found to be hypotensive initially although this appeared to respond well to iv fluids. Blood work showed patient to have leukocytosis. X-ray is concerning for possible pneumonia. Will plan on starting antibiotics here in the emergency department. Will plan on admission.  ________________________________________   FINAL CLINICAL IMPRESSION(S) / ED DIAGNOSES  Final diagnoses:  Pneumonia due to infectious organism,  unspecified laterality, unspecified part of lung     Note: This dictation was prepared with Dragon dictation. Any transcriptional errors that result from this process are unintentional     Nance Pear, MD 09/12/21 [id number]

## 2021-09-12 NOTE — H&P (Signed)
History and Physical    [name] [name] [id number] DOB: 10-27-1927 DOA: 09/12/2021  PCP: Ernesta Amble, MD    Patient coming from:  SNF   Chief Complaint:  Hypoxia /Blurred vision.   HPI:  [name] [name] is a 87 y.o. male seen in ed with complaints of low blood pressure and blurred vision.  Patient states that [facility] and is coming to [place] because when he used to get up he is noticed for the past few days that he was having blurred vision and is unclear about his visual abnormality and the nurses at the facility were trying to help him try to figure out and noticed that his oxygen was low and so he was brought to the emergency room.  HPI and review of systems is limited secondary to his hearing impaired state but he does look comfortable and in no distress and denies any chest pain fevers chills abdominal pain or any other complaints.  CODE STATUS verified with the patient he states that he misses his wife and sometimes he hears her voice and he has lived a very good life he is 87 years old he has 2 wonderful children and grandchildren and he is tired of getting in and out of the hospital and it he gets into a condition where his heart is not beating or he is not breathing he would like to be not resuscitated.  Patient at this time is alert awake oriented and [name] [name] the ED nurse is at bedside.  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  States he is talks to both his children every day. Patient was recently admitted on August 18, 2021 and discharged on the August 24, 2021.  Admission was for seizure-like activities and question loss of consciousness.  Patient had a CT abdomen and pelvis that showed left lower lobe consolidation and treated with IV Zosyn and patient was started on Augmentin on a barium swallow done on admission showed trace aspiration per note daughter was  aware to continue regular diet with thin liquids.  Patient also had anemia with a hemoglobin of 5.6 and patient was transfused and seen by GI with recommendations for monitoring and PPI therapy.  I suspect that patient had a syncopal episode from an acute GI bleed leading to hemorrhagic shock from acute blood loss anemia and lactic acidosis.  Pt has past medical history of atrial fibrillation, diabetes mellitus, hyperlipidemia, hypertension, sepsis, acute kidney injury, hematuria, PAD, osteomyelitis, hypothyroidism, lung nodule, allergy to statins.  ED Course:  Vitals:   09/12/21 1700 09/12/21 1805 09/12/21 1930 09/12/21 2100  BP: (!) 107/59 (!) 120/46 118/61 (!) 133/46  Pulse: 61 61 60 (!) 59  Resp: 18 19 18 20   Temp:      SpO2: 96% 93% 97% 97%  Weight:      Height:      In the emergency room patient is able to give brief history initially he was hypotensive and blood pressure has improved, patient received 1 L normal saline bolus along with vancomycin and cefepime for possible healthcare associated pneumonia, patient also started on normal saline at 100 cc/h. Initial EKG showed atrial and ventricular paced rhythm at 67, LAD, right bundle branch block, QTC of 475.  Chest x-ray showed bilateral pleural effusion with atelectasis versus pneumonia.  Troponin of 52, CMP shows a glucose of 205 acute kidney injury  with a creatinine of 1.40, GFR of 47, CBC shows a white count of 17.5 hemoglobin of 10.4 MCV of 77.4 and platelets of 412.  Patient's last hemoglobin A1c was August 19, 2021 at 7.1.  Blood cultures collected in the emergency room.  Review of Systems  Unable to perform ROS: Other (hearing imapried.)  HENT:  Positive for hearing loss.   Respiratory:  Negative for shortness of breath.   Cardiovascular:  Negative for chest pain.  Gastrointestinal: Negative.   Musculoskeletal:  Positive for back pain.  Neurological: Negative.     Past Medical History:  Diagnosis Date   Atrial  fibrillation ([place])    Diabetes mellitus without complication ([place])    Hyperlipidemia    Hypertension     Past Surgical History:  Procedure Laterality Date   APPENDECTOMY     CHOLECYSTECTOMY     TONSILLECTOMY       reports that he has quit smoking. He has never used smokeless tobacco. He reports that he does not drink alcohol and does not use drugs.  Allergies  Allergen Reactions   Statins Palpitations    History reviewed. No pertinent family history.  Prior to Admission medications   Medication Sig Start Date End Date Taking? Authorizing Provider  acetaminophen (TYLENOL) 500 MG tablet Take 500 mg by mouth every 8 (eight) hours as needed for mild pain.    [provider]  apixaban (ELIQUIS) 5 MG TABS tablet Hold until followup with PCP due to drop in hemoglobin and suspected occult bleed. 08/24/21   [name] Bi, MD  Cholecalciferol 25 MCG (1000 UT) tablet Take 1,000 mg by mouth daily. 08/24/20   [provider]  cyanocobalamin 1000 MCG tablet Take 1,000 mcg by mouth daily.    [provider]  diltiazem (TIAZAC) 180 MG 24 hr capsule Hold until outpatient followup due to normal blood pressure without it. 08/24/21   [name] Bi, MD  ezetimibe (ZETIA) 10 MG tablet Take 10 mg by mouth daily. 08/09/20   [provider]  ferrous sulfate 325 (65 FE) MG tablet Take 1 tablet (325 mg total) by mouth daily with breakfast. 08/25/21   [name] Bi, MD  finasteride (PROSCAR) 5 MG tablet Take 5 mg by mouth daily. 08/24/20   [provider]  furosemide (LASIX) 20 MG tablet Hold until outpatient followup due to blood pressure normal without it. 08/24/21   [name] Bi, MD  gabapentin (NEURONTIN) 300 MG capsule Take 300 mg by mouth at bedtime.    [provider]  levothyroxine (SYNTHROID) 125 MCG tablet Take 125 mcg by mouth daily before breakfast.    [provider]  Multiple Vitamin (MULTIVITAMIN WITH MINERALS) TABS tablet Take 1 tablet by mouth daily.     [provider]  pantoprazole (PROTONIX) 40 MG tablet Take 1 tablet (40 mg total) by mouth daily. 08/25/21   [name] Bi, MD  psyllium (METAMUCIL SMOOTH TEXTURE) 58.6 % powder Take 1 packet by mouth every evening.    [provider]  sitaGLIPtin (JANUVIA) 100 MG tablet Take 100 mg by mouth daily.    [provider]  Skin Protectants, Misc. (CALAZIME SKIN PROTECTANT EX) Apply 1 application topically as needed (periwound area).    [provider]  tamsulosin (FLOMAX) 0.4 MG CAPS capsule Take 0.4 mg by mouth at bedtime. 08/09/20   [provider]    Physical Exam: Vitals:   09/12/21 1700 09/12/21 1805 09/12/21 1930 09/12/21 2100  BP: (!) 107/59 (!) 120/46 118/61 (!) 133/46  Pulse: 61 61 60 (!) 59  Resp: 18 19 18 20   Temp:      SpO2: 96% 93% 97% 97%  Weight:      Height:       Physical Exam Vitals reviewed.  Constitutional:      General: He is not in acute distress.    Appearance: He is not ill-appearing.  HENT:     Head: Normocephalic and atraumatic.     Right Ear: External ear normal.     Left Ear: External ear normal.     Nose: Nose normal.     Mouth/Throat:     Mouth: Mucous membranes are moist.  Eyes:     Extraocular Movements: Extraocular movements intact.     Pupils: Pupils are equal, round, and reactive to light.  Cardiovascular:     Rate and Rhythm: Normal rate and regular rhythm.     Pulses: Normal pulses.     Heart sounds: Normal heart sounds.  Pulmonary:     Effort: Pulmonary effort is normal.     Breath sounds: Normal breath sounds.  Abdominal:     General: Bowel sounds are normal. There is no distension.     Palpations: Abdomen is soft. There is no mass.     Tenderness: There is no abdominal tenderness. There is no guarding.     Hernia: No hernia is present.  Musculoskeletal:     Right lower leg: No edema.     Left lower leg: No edema.  Skin:    General: Skin is warm.  Neurological:     General: No focal deficit  present.     Mental Status: He is alert and oriented to person, place, and time.  Psychiatric:        Mood and Affect: Mood normal.        Behavior: Behavior normal.     Labs on Admission: I have personally reviewed following labs and imaging studies  No results for input(s): CKTOTAL, CKMB, TROPONINI in the last 72 hours. Lab Results  Component Value Date   WBC 17.5 (H) 09/12/2021   HGB 10.4 (L) 09/12/2021   HCT 34.3 (L) 09/12/2021   MCV 77.4 (L) 09/12/2021   PLT 412 (H) 09/12/2021    Recent Labs  Lab 09/12/21 1519  NA 136  K 4.0  CL 101  CO2 28  BUN 29*  CREATININE 1.40*  CALCIUM 8.6*  GLUCOSE 205*   No results found for: CHOL, HDL, LDLCALC, TRIG No results found for: DDIMER Invalid input(s): POCBNP   COVID-19 Labs No results for input(s): DDIMER, FERRITIN, LDH, CRP in the last 72 hours. Lab Results  Component Value Date   SARSCOV2NAA NEGATIVE 09/12/2021   SARSCOV2NAA NEGATIVE 08/24/2021   SARSCOV2NAA NEGATIVE 08/18/2021   SARSCOV2NAA NEGATIVE 09/10/2020    Radiological Exams on Admission: DG Chest Portable 1 View  Result Date: 09/12/2021 CLINICAL DATA:  Shortness of breath EXAM: PORTABLE CHEST 1 VIEW COMPARISON:  08/18/2021 FINDINGS: Left-sided pacing device as before. Small bilateral effusions with hazy bibasilar opacity. Stable cardiomediastinal silhouette with aortic atherosclerosis. IMPRESSION: 1. Small bilateral effusions with hazy atelectasis or pneumonia at the bases. 2. Mild cardiomegaly Electronically Signed   By: 14/10/2020 M.D.   On: 09/12/2021 18:12    EKG: Independently reviewed.  Atrial and ventricular paced rhythm at 67.   Assessment/Plan: Principal Problem:   Hypoxia Active Problems:   Blurred vision   Anemia   AKI (acute kidney injury) (HCC)   Hypotension   Type  2 diabetes mellitus with hyperglycemia (HCC)   Hypertension   Hypothyroid   SSS (sick sinus syndrome) (HCC)   Atrial fibrillation, chronic (HCC)   H/O  syncope Hypoxia: SpO2: 97 % Resolved patient is 97% on room air.  Blurred vision: Suspect patient's blurred vision is related to nonneurological issue possibly cataracts and recommend eye exam on outpatient evaluation also for glaucoma.  Currently patient has no visual complaints his symptoms have resolved.  Differentials also include vitreous degeneration. Patient had a noncontrast head CT on previous admission recently which was negative for any acute abnormalities.  Suspect visual aberrations also secondary to hypotension.  Anemia: Suspect patient has iron deficiency anemia from chronic blood loss in the setting of anticoagulation as patient has a history of PAD has a pacemaker for sick sinus syndrome.  CBC today shows a hemoglobin of 10.4. We will type and screen continue with IV PPI and follow.  Acute kidney injury/hypotension: Lab Results  Component Value Date   CREATININE 1.40 (H) 09/12/2021   CREATININE 1.00 08/24/2021   CREATININE 1.26 (H) 08/23/2021  Attribute to hypotension secondary to hypovolemia and dehydration.  62 -- -- 74/41 Abnormal  Sitting 98 %   Will hold patient's Lasix, diltiazem. Restart at lower dose. Suspect renal issue to be secondary to prerenal etiology.  Dm II: SSI, consistent carb diet with mechanical soft as pt has trace aspiration.    HTN: Blood pressure (!) 133/46, pulse (!) 59, temperature 98.4 F (36.9 C), resp. rate 20, height 5\' 11"  (1.803 m), weight 92 kg, SpO2 97 %. Pt hypotensive on arrival. Will hold bp meds and restart in AM. Fall precautions.  Hypothyroidism: Resume levothyroxine and TFT's.   SSS s/p pacemaker/ C/H Atrial fibrillation: Device interrogation. Cardiology consult as needed.  Eliquis continued for a.fib pt is in  sinus today.   H/O syncope: Pt had syncope recent admission , I suspect is most likely from acute blood loss anemia and hemorrhagic shock.  Current hb is 10.4 we will monitor.  Will get PT consult for  generalized weakness and gait safety.    DVT prophylaxis:  Eliquis   Code Status:  DNR.  Family Communication:  Chemical engineer (Daughter)  [phone] (Mobile)   Disposition Plan:  TBD   Consults called:  None   Admission status: Inpatient.     Para Skeans MD [facility]  6 PM- 2 AM. Please contact me via secure Chat 6 PM-2 AM.   Please check AMION to check for cross coverage for the hours specific coverage for [facility].  Check the care team in [facility] and look for a) attending/consulting [facility] provider listed and b) the [facility] team listed Log into [email or web address] and use [facility] universal password to access. If you do not have the password, please contact the hospital operator. Locate the [facility] provider you are looking for under [facility] and page to a number that you can be directly reached. If you still have difficulty reaching the provider, please page the [facility] (Director on Call) for the Hospitalists listed on amion for assistance. [email or web address] 09/13/2021, 12:34 AM

## 2021-09-12 NOTE — ED Notes (Signed)
Pt changed into clean brief and Male Purewick applied

## 2021-09-12 NOTE — ED Provider Notes (Signed)
Emergency Medicine Provider Triage Evaluation Note  [name] [name], a 87 y.o. male below medical history including hypertension, diabetes, and hypothyroidism who presents to the ED was evaluated in triage.  Pt complains of weakness and dizziness in the mornings upon standing.  Patient presents via EMS from [facility].  Denies any fever, cough, congestion, nausea, vomiting, or diarrhea.  He is without any complaints of pain at this time.  Review of Systems  Positive: Weakness, dizziness Negative: NVD  Physical Exam  BP (!) 74/41 (BP Location: Right Arm)    Pulse 62    Temp 98.4 F (36.9 C)    Resp 18    Ht 5\' 11"  (1.803 m)    Wt 92 kg    SpO2 98%    BMI 28.29 kg/m  Gen:   Awake, no distress  NAD Resp:  Normal effort CTA MSK:   Moves extremities without difficulty  Other:  CVS: brady rate  Medical Decision Making  Medically screening exam initiated at 3:26 PM.  Appropriate orders placed.  [name] [name] was informed that the remainder of the evaluation will be completed by another provider, this initial triage assessment does not replace that evaluation, and the importance of remaining in the ED until their evaluation is complete.  Geriatric patient with ED evaluation of weakness and dizziness presented to the ED via EMS from [facility].   Peterson Lombard, PA-C 09/12/21 1528    09/14/21, MD 09/12/21 [phone]

## 2021-09-13 DIAGNOSIS — Z87898 Personal history of other specified conditions: Secondary | ICD-10-CM

## 2021-09-13 DIAGNOSIS — L8990 Pressure ulcer of unspecified site, unspecified stage: Secondary | ICD-10-CM | POA: Insufficient documentation

## 2021-09-13 DIAGNOSIS — D509 Iron deficiency anemia, unspecified: Secondary | ICD-10-CM

## 2021-09-13 DIAGNOSIS — R531 Weakness: Secondary | ICD-10-CM | POA: Diagnosis not present

## 2021-09-13 DIAGNOSIS — J189 Pneumonia, unspecified organism: Secondary | ICD-10-CM | POA: Diagnosis not present

## 2021-09-13 DIAGNOSIS — H538 Other visual disturbances: Secondary | ICD-10-CM | POA: Diagnosis present

## 2021-09-13 DIAGNOSIS — I495 Sick sinus syndrome: Secondary | ICD-10-CM | POA: Diagnosis present

## 2021-09-13 DIAGNOSIS — I959 Hypotension, unspecified: Secondary | ICD-10-CM

## 2021-09-13 DIAGNOSIS — I4891 Unspecified atrial fibrillation: Secondary | ICD-10-CM | POA: Diagnosis not present

## 2021-09-13 DIAGNOSIS — I482 Chronic atrial fibrillation, unspecified: Secondary | ICD-10-CM | POA: Diagnosis not present

## 2021-09-13 DIAGNOSIS — N179 Acute kidney failure, unspecified: Secondary | ICD-10-CM | POA: Diagnosis present

## 2021-09-13 LAB — COMPREHENSIVE METABOLIC PANEL
ALT: 12 U/L (ref 0–44)
AST: 19 U/L (ref 15–41)
Albumin: 2.5 g/dL — ABNORMAL LOW (ref 3.5–5.0)
Alkaline Phosphatase: 71 U/L (ref 38–126)
Anion gap: 6 (ref 5–15)
BUN: 32 mg/dL — ABNORMAL HIGH (ref 8–23)
CO2: 25 mmol/L (ref 22–32)
Calcium: 8.2 mg/dL — ABNORMAL LOW (ref 8.9–10.3)
Chloride: 105 mmol/L (ref 98–111)
Creatinine, Ser: 1 mg/dL (ref 0.61–1.24)
GFR, Estimated: >60 mL/min (ref >60)
Glucose, Bld: 299 mg/dL — ABNORMAL HIGH (ref 70–99)
Potassium: 3.7 mmol/L (ref 3.5–5.1)
Sodium: 136 mmol/L (ref 135–145)
Total Bilirubin: 0.7 mg/dL (ref 0.3–1.2)
Total Protein: 5.5 g/dL — ABNORMAL LOW (ref 6.5–8.1)

## 2021-09-13 LAB — CBC
HCT: 26.6 % — ABNORMAL LOW (ref 39.0–52.0)
Hemoglobin: 7.9 g/dL — ABNORMAL LOW (ref 13.0–17.0)
MCH: 22.6 pg — ABNORMAL LOW (ref 26.0–34.0)
MCHC: 29.7 g/dL — ABNORMAL LOW (ref 30.0–36.0)
MCV: 76 fL — ABNORMAL LOW (ref 80.0–100.0)
Platelets: 315 10*3/uL (ref 150–400)
RBC: 3.5 MIL/uL — ABNORMAL LOW (ref 4.22–5.81)
RDW: 22.8 % — ABNORMAL HIGH (ref 11.5–15.5)
WBC: 15.2 10*3/uL — ABNORMAL HIGH (ref 4.0–10.5)
nRBC: 0 % (ref 0.0–0.2)

## 2021-09-13 LAB — MRSA NEXT GEN BY PCR, NASAL: MRSA by PCR Next Gen: NOT DETECTED

## 2021-09-13 MED ORDER — IPRATROPIUM-ALBUTEROL 0.5-2.5 (3) MG/3ML IN SOLN: 3.0000 mL | Freq: Four times a day (QID) | RESPIRATORY_TRACT | Status: DC | PRN

## 2021-09-13 MED ORDER — SODIUM CHLORIDE 0.9 % IV SOLN
1.0000 g | INTRAVENOUS | Status: DC
  Administered 2021-09-13 – 2021-09-15 (×3): 1 g via INTRAVENOUS
  Filled 2021-09-13: qty 10
  Filled 2021-09-13: qty 1
  Filled 2021-09-13: qty 10
  Filled 2021-09-13: qty 1

## 2021-09-13 MED ORDER — FERROUS SULFATE 325 (65 FE) MG PO TABS
325.0000 mg | ORAL_TABLET | Freq: Every day | ORAL | Status: DC
  Administered 2021-09-13 – 2021-09-15 (×3): 325 mg via ORAL
  Filled 2021-09-13 (×3): qty 1

## 2021-09-13 MED ORDER — VITAMIN B-12 1000 MCG PO TABS
1000.0000 ug | ORAL_TABLET | Freq: Every day | ORAL | Status: DC
  Administered 2021-09-13 – 2021-09-15 (×3): 1000 ug via ORAL
  Filled 2021-09-13 (×3): qty 1

## 2021-09-13 MED ORDER — SODIUM CHLORIDE 0.9 % IV SOLN: INTRAVENOUS | Status: DC

## 2021-09-13 MED ORDER — SODIUM CHLORIDE 0.9 % IV SOLN
500.0000 mg | INTRAVENOUS | Status: DC
  Administered 2021-09-13 – 2021-09-14 (×2): 500 mg via INTRAVENOUS
  Filled 2021-09-13: qty 500
  Filled 2021-09-13: qty 5

## 2021-09-13 MED ORDER — PSYLLIUM 95 % PO PACK
1.0000 | PACK | Freq: Every day | ORAL | Status: DC
  Administered 2021-09-13 – 2021-09-15 (×3): 1 via ORAL
  Filled 2021-09-13 (×3): qty 1

## 2021-09-13 MED ORDER — ZINC SULFATE 220 (50 ZN) MG PO CAPS
220.0000 mg | ORAL_CAPSULE | Freq: Every day | ORAL | Status: DC
  Administered 2021-09-13 – 2021-09-15 (×3): 220 mg via ORAL
  Filled 2021-09-13 (×3): qty 1

## 2021-09-13 MED ORDER — PANTOPRAZOLE SODIUM 40 MG PO TBEC
40.0000 mg | DELAYED_RELEASE_TABLET | Freq: Every day | ORAL | Status: DC
  Administered 2021-09-13 – 2021-09-15 (×3): 40 mg via ORAL
  Filled 2021-09-13 (×3): qty 1

## 2021-09-13 MED ORDER — APIXABAN 2.5 MG PO TABS
2.5000 mg | ORAL_TABLET | Freq: Two times a day (BID) | ORAL | Status: DC
  Administered 2021-09-13 – 2021-09-15 (×5): 2.5 mg via ORAL
  Filled 2021-09-13 (×5): qty 1

## 2021-09-13 MED ORDER — EZETIMIBE 10 MG PO TABS
10.0000 mg | ORAL_TABLET | Freq: Every day | ORAL | Status: DC
  Administered 2021-09-13 – 2021-09-15 (×3): 10 mg via ORAL
  Filled 2021-09-13 (×3): qty 1

## 2021-09-13 MED ORDER — VITAMIN D 25 MCG (1000 UNIT) PO TABS
1000.0000 [IU] | ORAL_TABLET | Freq: Every day | ORAL | Status: DC
  Administered 2021-09-13 – 2021-09-15 (×3): 1000 [IU] via ORAL
  Filled 2021-09-13 (×3): qty 1

## 2021-09-13 MED ORDER — GABAPENTIN 300 MG PO CAPS
300.0000 mg | ORAL_CAPSULE | Freq: Three times a day (TID) | ORAL | Status: DC
  Administered 2021-09-13 – 2021-09-15 (×9): 300 mg via ORAL
  Filled 2021-09-13 (×9): qty 1

## 2021-09-13 NOTE — Progress Notes (Signed)
PROGRESS NOTE    [name] [name]  QIW:[id number] DOB: [birth year] DOA: 09/12/2021 PCP: Zenaida Niece, MD    Assessment & Plan:   Principal Problem:   Hypoxia Active Problems:   Hypertension   Hypothyroid   Anemia   Type 2 diabetes mellitus with hyperglycemia (HCC)   Blurred vision   AKI (acute kidney injury) (HCC)   Hypotension   SSS (sick sinus syndrome) (HCC)   H/O syncope   Atrial fibrillation, chronic (HCC)   Pressure injury of skin  Pneumonia: continue on IV azithromycin, rocephin. Continue on bronchodilators and encourage incentive spirometry. Was d/c on 08/24/21 for pneumonia was initially treated w/ zosyn & switched to po augmentin. 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 Will check iron studies  Blurred vision: etiology unclear. Denies any complaints. Will need to see ophthalmologist as an outpatient   CKDIIa: Cr is trending down today  DM2: likely poorly controlled. Continue on SSI w/ accuchecks  HTN: will hold home anti-HTN meds as BP is low normal  Sick sinus syndrome: s/p pacemaker. Hx of likely PAF. Continue on eliquis  Generalized weakness: likely d/c back to SNF, [facility]     DVT prophylaxis: eliquis Code Status: DNR Family Communication: discussed pt's care w/ pt's daughter, Victorino Dike, and answered her  Disposition Plan: possibly d/c to SNF  Level of care: Telemetry Medical  Status is: Inpatient  Remains inpatient appropriate because: severity of illness     Consultants:    Procedures:   Antimicrobials: rocephin, azithromycin    Subjective: Pt c/o fatigue   Objective: Vitals:   09/12/21 2100 09/13/21 0100 09/13/21 0220 09/13/21 0523  BP: (!) 133/46 (!) 120/50 (!) 117/50 (!) 125/49  Pulse: (!) 59 (!) 59 (!) 59 (!) 59  Resp: 20 14 16 16   Temp:   97.8 F (36.6 C) 97.6 F (36.4 C)  SpO2: 97% 97% 95% 98%  Weight:      Height:        Intake/Output Summary (Last 24 hours) at 09/13/2021  0741 Last data filed at 09/13/2021 0447 Gross per 24 hour  Intake 271.43 ml  Output --  Net 271.43 ml   Filed Weights   09/12/21 1518  Weight: 92 kg    Examination:  General exam: Appears calm and comfortable  Respiratory system: decreased breath sounds b/l  Cardiovascular system: S1 & S2 +. No rubs, gallops or clicks.  Gastrointestinal system: Abdomen is nondistended, soft and nontender. Normal bowel sounds heard. Central nervous system: Alert and oriented. Moves all extremities  Psychiatry: Judgement and insight appear abnormal. Flat mood and affect     Data Reviewed: I have personally reviewed following labs and imaging studies  CBC: Recent Labs  Lab 09/12/21 1519 09/13/21 0518  WBC 17.5* 15.2*  HGB 10.4* 7.9*  HCT 34.3* 26.6*  MCV 77.4* 76.0*  PLT 412* 315   Basic Metabolic Panel: Recent Labs  Lab 09/12/21 1519 09/13/21 0518  NA 136 136  K 4.0 3.7  CL 101 105  CO2 28 25  GLUCOSE 205* 299*  BUN 29* 32*  CREATININE 1.40* 1.00  CALCIUM 8.6* 8.2*   GFR: Estimated Creatinine Clearance: 55.8 mL/min (by C-G formula based on SCr of 1 mg/dL). Liver Function Tests: Recent Labs  Lab 09/13/21 0518  AST 19  ALT 12  ALKPHOS 71  BILITOT 0.7  PROT 5.5*  ALBUMIN 2.5*   No results for input(s): LIPASE, AMYLASE in the last 168 hours. No results for  input(s): AMMONIA in the last 168 hours. Coagulation Profile: No results for input(s): INR, PROTIME in the last 168 hours. Cardiac Enzymes: No results for input(s): CKTOTAL, CKMB, CKMBINDEX, TROPONINI in the last 168 hours. BNP (last 3 results) No results for input(s): PROBNP in the last 8760 hours. HbA1C: No results for input(s): HGBA1C in the last 72 hours. CBG: No results for input(s): GLUCAP in the last 168 hours. Lipid Profile: No results for input(s): CHOL, HDL, LDLCALC, TRIG, CHOLHDL, LDLDIRECT in the last 72 hours. Thyroid Function Tests: No results for input(s): TSH, T4TOTAL, FREET4, T3FREE, THYROIDAB  in the last 72 hours. Anemia Panel: No results for input(s): VITAMINB12, FOLATE, FERRITIN, TIBC, IRON, RETICCTPCT in the last 72 hours. Sepsis Labs: Recent Labs  Lab 09/12/21 1519 09/12/21 2005  PROCALCITON 0.10  --   LATICACIDVEN 1.7 1.4    Recent Results (from the past 240 hour(s))  Resp Panel by RT-PCR (Flu A&B, Covid) Nasopharyngeal Swab     Status: None   Collection Time: 09/12/21  8:31 PM   Specimen: Nasopharyngeal Swab; Nasopharyngeal(NP) swabs in vial transport medium  Result Value Ref Range Status   SARS Coronavirus 2 by RT PCR NEGATIVE NEGATIVE Final    Comment: (NOTE) SARS-CoV-2 target nucleic acids are NOT DETECTED.  The SARS-CoV-2 RNA is generally detectable in upper respiratory specimens during the acute phase of infection. The lowest concentration of SARS-CoV-2 viral copies this assay can detect is 138 copies/mL. A negative result does not preclude SARS-Cov-2 infection and should not be used as the sole basis for treatment or other patient management decisions. A negative result may occur with  improper specimen collection/handling, submission of specimen other than nasopharyngeal swab, presence of viral mutation(s) within the areas targeted by this assay, and inadequate number of viral copies(<138 copies/mL). A negative result must be combined with clinical observations, patient history, and epidemiological information. The expected result is Negative.  Fact Sheet for Patients:  BloggerCourse.com  Fact Sheet for Healthcare Providers:  SeriousBroker.it  This test is no t yet approved or cleared by the [place] FDA and  has been authorized for detection and/or diagnosis of SARS-CoV-2 by FDA under an Emergency Use Authorization (EUA). This EUA will remain  in effect (meaning this test can be used) for the duration of the COVID-19 declaration under Section 564(b)(1) of the Act, 21 U.S.C.section  360bbb-3(b)(1), unless the authorization is terminated  or revoked sooner.       Influenza A by PCR NEGATIVE NEGATIVE Final   Influenza B by PCR NEGATIVE NEGATIVE Final    Comment: (NOTE) The Xpert Xpress SARS-CoV-2/FLU/RSV plus assay is intended as an aid in the diagnosis of influenza from Nasopharyngeal swab specimens and should not be used as a sole basis for treatment. Nasal washings and aspirates are unacceptable for Xpert Xpress SARS-CoV-2/FLU/RSV testing.  Fact Sheet for Patients: BloggerCourse.com  Fact Sheet for Healthcare Providers: SeriousBroker.it  This test is not yet approved or cleared by the [place] FDA and has been authorized for detection and/or diagnosis of SARS-CoV-2 by FDA under an Emergency Use Authorization (EUA). This EUA will remain in effect (meaning this test can be used) for the duration of the COVID-19 declaration under Section 564(b)(1) of the Act, 21 U.S.C. section 360bbb-3(b)(1), unless the authorization is terminated or revoked.  Performed at [facility], [address] [place], [place] [address]   MRSA Next Gen by PCR, Nasal     Status: None   Collection Time: 09/13/21  3:24 AM  Specimen: Nasal Mucosa; Nasal Swab  Result Value Ref Range Status   MRSA by PCR Next Gen NOT DETECTED NOT DETECTED Final    Comment: (NOTE) The GeneXpert MRSA Assay (FDA approved for NASAL specimens only), is one component of a comprehensive MRSA colonization surveillance program. It is not intended to diagnose MRSA infection nor to guide or monitor treatment for MRSA infections. Test performance is not FDA approved in patients less than 64 years old. Performed at [facility], [address], [place], [place] [address]          Radiology Studies: DG Chest Portable 1 View  Result Date: 09/12/2021 CLINICAL DATA:  Shortness of breath EXAM: PORTABLE CHEST 1 VIEW COMPARISON:   08/18/2021 FINDINGS: Left-sided pacing device as before. Small bilateral effusions with hazy bibasilar opacity. Stable cardiomediastinal silhouette with aortic atherosclerosis. IMPRESSION: 1. Small bilateral effusions with hazy atelectasis or pneumonia at the bases. 2. Mild cardiomegaly Electronically Signed   By: Jasmine Pang M.D.   On: 09/12/2021 18:12        Scheduled Meds:  cholecalciferol  1,000 Units Oral Daily   ezetimibe  10 mg Oral Daily   ferrous sulfate  325 mg Oral Q breakfast   finasteride  5 mg Oral Daily   gabapentin  300 mg Oral TID   levothyroxine  125 mcg Oral Q0600   pantoprazole  40 mg Oral Daily   psyllium  1 packet Oral Daily   sodium chloride flush  3 mL Intravenous Q12H   tamsulosin  0.4 mg Oral QHS   vitamin B-12  1,000 mcg Oral Daily   zinc sulfate  220 mg Oral Daily   Continuous Infusions:  sodium chloride 30 mL/hr at 09/13/21 0220   ceFEPime (MAXIPIME) IV       LOS: 1 day    Time spent: 7 mins     Charise Killian, MD [facility] Pager [phone]  If 7PM-7AM, please contact night-coverage 09/13/2021, 7:41 AM

## 2021-09-13 NOTE — Evaluation (Signed)
Physical Therapy Evaluation Patient Details Name: [name] [name] MRN: [id number] DOB: January 02, 1928 Today's Date: 09/13/2021  History of Present Illness  [name] [name] is a 87 y.o. male here with complaints of low blood pressure and blurred vision at rehab facility. Recently admitted 12/2-8/22 for PNA. PMH: atrial fibrillation, diabetes mellitus, hyperlipidemia, hypertension, sepsis, acute kidney injury, hematuria, PAD, osteomyelitis, hypothyroidism, lung nodule, allergy to statins.  Clinical Impression  Pt pleasant and eager to work with PT.  He did relatively well with bed mobility and ambulation, however is unable to rise from seated w/o assist.  Even from elevated surfaces, with extra cuing and assist with set up and sequencing he simply did not have the quad strength to attain standing w/o mod assist.  Reports he walks to the dining hall 3x/day and is essentially independent in his apartment at baseline.  Pt with no lightheadness or other symptoms t/o the session, O2 and HR stable and appropriate pre, during, post activity.     Recommendations for follow up therapy are one component of a multi-disciplinary discharge planning process, led by the attending physician.  Recommendations may be updated based on patient status, additional functional criteria and insurance authorization.  Follow Up Recommendations Skilled nursing-short term rehab (<3 hours/day)    Assistance Recommended at Discharge Intermittent Supervision/Assistance  Functional Status Assessment Patient has had a recent decline in their functional status and demonstrates the ability to make significant improvements in function in a reasonable and predictable amount of time.  Equipment Recommendations  None recommended by PT    Recommendations for Other Services       Precautions / Restrictions Precautions Precautions: Fall Precaution Comments: orthostatic Restrictions Weight Bearing Restrictions: No      Mobility  Bed  Mobility Overal bed mobility: Needs Assistance Bed Mobility: Supine to Sit Rolling: Min assist   Supine to sit: Min assist Sit to supine: Min assist   General bed mobility comments: Pt showed good effort with getting to EOB, needed only light assist    Transfers Overall transfer level: Needs assistance Equipment used: Rolling walker (2 wheels) Transfers: Sit to/from Stand Sit to Stand: Mod assist;Max assist           General transfer comment: Multiple sit to stand bouts t/o the session, but needed considerable assist with each attempt. Even with much cuing for set up and encouragement pt's LE weakness was too profound to attain standing even with elevated surface.    Ambulation/Gait Ambulation/Gait assistance: Min guard Gait Distance (Feet): 200 Feet Assistive device: Rolling walker (2 wheels)         General Gait Details: Heavy UE use on RW (unable to straighten up/keep walker close despite cuing).  Increased hip and knee flexion to clear LE's in swing phase due to hx of B foot drop.  Pt very much struggled to attain standing but once up was able to assume slow but consistent circumambulation of the nurses' station.  O2 remained in the mid 90s t/o the session, HR in the 60s with c/o mild fatigue by the end of the loop.  Stairs            Wheelchair Mobility    Modified Rankin (Stroke Patients Only)       Balance Overall balance assessment: Needs assistance Sitting-balance support: Feet supported;No upper extremity supported Sitting balance-Leahy Scale: Fair     Standing balance support: Bilateral upper extremity supported;Reliant on assistive device for balance Standing balance-Leahy Scale: Fair  Pertinent Vitals/Pain Pain Assessment: No/denies pain    Home Living Family/patient expects to be discharged to:: [place]: Conservation officer, nature (2 wheels);Rollator (4  wheels)      Prior Function Prior Level of Function : Independent/Modified Independent             Mobility Comments: Prior to recent hospitalization pt was MOD I for mobility at [facility] ADLs Comments: Reports he walks ~100 ft 3x/day to dining hall, independently performs dressing, bathing (seated, uses baby wipes) and other ADL's. Does not leave community.     Hand Dominance        Extremity/Trunk Assessment   Upper Extremity Assessment Upper Extremity Assessment: Generalized weakness    Lower Extremity Assessment Lower Extremity Assessment: Generalized weakness       Communication   Communication: HOH  Cognition Arousal/Alertness: Awake/alert Behavior During Therapy: WFL for tasks assessed/performed Overall Cognitive Status: Within Functional Limits for tasks assessed                                 General Comments: A&O x4        General Comments      Exercises     Assessment/Plan    PT Assessment Patient needs continued PT services  PT Problem List Decreased strength;Decreased activity tolerance;Decreased mobility;Decreased safety awareness;Cardiopulmonary status limiting activity       PT Treatment Interventions Gait training;DME instruction;Therapeutic exercise;Balance training;Stair training;Neuromuscular re-education;Functional mobility training;Therapeutic activities;Patient/family education    PT Goals (Current goals can be found in the Care Plan section)  Acute Rehab PT Goals Patient Stated Goal: continue working with PT at rehab until he is strong enough to go back to his apartment PT Goal Formulation: With patient Time For Goal Achievement: 09/27/21 Potential to Achieve Goals: Good    Frequency Min 2X/week   Barriers to discharge        Co-evaluation               AM-PAC PT "6 Clicks" Mobility  Outcome Measure Help needed turning from your back to your side while in a flat bed without using bedrails?: A  Little Help needed moving from lying on your back to sitting on the side of a flat bed without using bedrails?: A Little Help needed moving to and from a bed to a chair (including a wheelchair)?: A Little Help needed standing up from a chair using your arms (e.g., wheelchair or bedside chair)?: A Lot Help needed to walk in hospital room?: A Little Help needed climbing 3-5 steps with a railing? : A Lot 6 Click Score: 16    End of Session Equipment Utilized During Treatment: Gait belt Activity Tolerance: Patient tolerated treatment well;Patient limited by fatigue Patient left: with call bell/phone within reach;with chair alarm set Nurse Communication: Mobility status PT Visit Diagnosis: Other abnormalities of gait and mobility (R26.89);Muscle weakness (generalized) (M62.81);Difficulty in walking, not elsewhere classified (R26.2)    Time: [id number] PT Time Calculation (min) (ACUTE ONLY): 32 min   Charges:   PT Evaluation $PT Eval Low Complexity: 1 Low PT Treatments $Gait Training: 8-22 mins $Therapeutic Activity: 8-22 mins        Kreg Shropshire, DPT 09/13/2021, 2:02 PM

## 2021-09-13 NOTE — Evaluation (Signed)
Occupational Therapy Evaluation Patient Details Name: [name] [name] MRN: [id number] DOB: Dec 13, 1927 Today's Date: 09/13/2021   History of Present Illness [name] [name] is a 87 y.o. male seen in ed with complaints of low blood pressure and blurred vision. Patient was recently admitted on August 18, 2021 and discharged on the August 24, 2021. PMH: atrial fibrillation, diabetes mellitus, hyperlipidemia, hypertension, sepsis, acute kidney injury, hematuria, PAD, osteomyelitis, hypothyroidism, lung nodule, allergy to statins.   Clinical Impression   [name] [name] was seen for OT evaluation this date. Prior to hospital admission, pt was recently d/c from hospital to STR, prior to recent admission pt was MOD I at [facility]. Pt presents to acute OT demonstrating impaired ADL performance and functional mobility 2/2 decreased activity tolerance and functional strength/ROM/balance deficits. Pt currently requires MIN A exit L side of bed, MOD A + RW sit<>stand and side steps at EOB. MAX A doff brief in standing - pt requires BUE support and limited standing tolerance. MAX A perihygiene at bed level. Pt would benefit from skilled OT to address noted impairments and functional limitations (see below for any additional details) in order to maximize safety and independence while minimizing falls risk and caregiver burden. Upon hospital discharge, recommend STR to maximize pt safety and return to PLOF.  Supine: BP 114/58, HR 60, MAP 75 Sitting: BP 105/42, HR 60, MAP 57 Standing: BP 79/46, HR 61, MAP 57      Recommendations for follow up therapy are one component of a multi-disciplinary discharge planning process, led by the attending physician.  Recommendations may be updated based on patient status, additional functional criteria and insurance authorization.   Follow Up Recommendations  Skilled nursing-short term rehab (<3 hours/day)    Assistance Recommended at Discharge Intermittent Supervision/Assistance   Functional Status Assessment  Patient has had a recent decline in their functional status and demonstrates the ability to make significant improvements in function in a reasonable and predictable amount of time.  Equipment Recommendations  BSC/3in1    Recommendations for Other Services       Precautions / Restrictions Precautions Precautions: Fall Precaution Comments: orthostatic Restrictions Weight Bearing Restrictions: No      Mobility Bed Mobility Overal bed mobility: Needs Assistance Bed Mobility: Supine to Sit;Sit to Supine;Rolling Rolling: Min assist   Supine to sit: Min assist Sit to supine: Min assist   General bed mobility comments: assist for BLE    Transfers Overall transfer level: Needs assistance Equipment used: Rolling walker (2 wheels) Transfers: Sit to/from Stand Sit to Stand: Mod assist           General transfer comment: assist for lift off and eccentric control      Balance Overall balance assessment: Needs assistance Sitting-balance support: Feet supported;No upper extremity supported Sitting balance-Leahy Scale: Fair     Standing balance support: Bilateral upper extremity supported;Reliant on assistive device for balance Standing balance-Leahy Scale: Fair                             ADL either performed or assessed with clinical judgement   ADL Overall ADL's : Needs assistance/impaired                                       General ADL Comments: MAX A don B socks at bed level. MOD A + RW for ADL t/f. MAX  A perihygiene at bed level.      Pertinent Vitals/Pain Pain Assessment: No/denies pain     Hand Dominance     Extremity/Trunk Assessment Upper Extremity Assessment Upper Extremity Assessment: Generalized weakness   Lower Extremity Assessment Lower Extremity Assessment: Generalized weakness       Communication Communication Communication: HOH   Cognition Arousal/Alertness:  Awake/alert Behavior During Therapy: WFL for tasks assessed/performed Overall Cognitive Status: Within Functional Limits for tasks assessed                                 General Comments: A&O x4                Home Living Family/patient expects to be discharged to:: Skilled nursing facility                             Home Equipment: Conservation officer, nature (2 wheels);Rollator (4 wheels)          Prior Functioning/Environment               Mobility Comments: Prior to recent hospitalization pt was MOD I for mobility at [facility]          OT Problem List: Decreased strength;Decreased safety awareness;Decreased activity tolerance;Decreased knowledge of use of DME or AE;Impaired balance (sitting and/or standing);Decreased range of motion      OT Treatment/Interventions: Self-care/ADL training;Therapeutic exercise;Therapeutic activities;DME and/or AE instruction;Patient/family education;Balance training;Energy conservation    OT Goals(Current goals can be found in the care plan section) Acute Rehab OT Goals Patient Stated Goal: to go home OT Goal Formulation: With patient Time For Goal Achievement: 09/27/21 Potential to Achieve Goals: Good ADL Goals Pt Will Perform Grooming: with min assist;standing (c LRAD PRN) Pt Will Perform Lower Body Dressing: with mod assist;sitting/lateral leans Pt Will Transfer to Toilet: with min assist;bedside commode;ambulating (c LRAD PRN)  OT Frequency: Min 2X/week    AM-PAC OT "6 Clicks" Daily Activity     Outcome Measure Help from another person eating meals?: None Help from another person taking care of personal grooming?: A Little Help from another person toileting, which includes using toliet, bedpan, or urinal?: A Lot Help from another person bathing (including washing, rinsing, drying)?: A Lot Help from another person to put on and taking off regular upper body clothing?: A Little Help from another  person to put on and taking off regular lower body clothing?: A Lot 6 Click Score: 16   End of Session Equipment Utilized During Treatment: Rolling walker (2 wheels) Nurse Communication: Mobility status  Activity Tolerance: Patient tolerated treatment well Patient left: in bed;with call bell/phone within reach;with bed alarm set;with nursing/sitter in room  OT Visit Diagnosis: Unsteadiness on feet (R26.81);Muscle weakness (generalized) (M62.81)                Time: [id number] OT Time Calculation (min): 25 min Charges:  OT General Charges $OT Visit: 1 Visit OT Evaluation $OT Eval Moderate Complexity: 1 Mod OT Treatments $Self Care/Home Management : 8-22 mins  Dessie Coma, M.S. OTR/L  09/13/21, 10:51 AM  ascom [phone]

## 2021-09-14 DIAGNOSIS — E1169 Type 2 diabetes mellitus with other specified complication: Secondary | ICD-10-CM | POA: Diagnosis not present

## 2021-09-14 DIAGNOSIS — R531 Weakness: Secondary | ICD-10-CM | POA: Diagnosis not present

## 2021-09-14 DIAGNOSIS — J189 Pneumonia, unspecified organism: Secondary | ICD-10-CM | POA: Diagnosis not present

## 2021-09-14 LAB — CBC
HCT: 28.6 % — ABNORMAL LOW (ref 39.0–52.0)
Hemoglobin: 8.5 g/dL — ABNORMAL LOW (ref 13.0–17.0)
MCH: 22.8 pg — ABNORMAL LOW (ref 26.0–34.0)
MCHC: 29.7 g/dL — ABNORMAL LOW (ref 30.0–36.0)
MCV: 76.7 fL — ABNORMAL LOW (ref 80.0–100.0)
Platelets: 294 10*3/uL (ref 150–400)
RBC: 3.73 MIL/uL — ABNORMAL LOW (ref 4.22–5.81)
RDW: 22.3 % — ABNORMAL HIGH (ref 11.5–15.5)
WBC: 8.2 10*3/uL (ref 4.0–10.5)
nRBC: 0 % (ref 0.0–0.2)

## 2021-09-14 LAB — BASIC METABOLIC PANEL
Anion gap: 7 (ref 5–15)
BUN: 25 mg/dL — ABNORMAL HIGH (ref 8–23)
CO2: 26 mmol/L (ref 22–32)
Calcium: 8.6 mg/dL — ABNORMAL LOW (ref 8.9–10.3)
Chloride: 105 mmol/L (ref 98–111)
Creatinine, Ser: 0.82 mg/dL (ref 0.61–1.24)
GFR, Estimated: >60 mL/min (ref >60)
Glucose, Bld: 139 mg/dL — ABNORMAL HIGH (ref 70–99)
Potassium: 3.5 mmol/L (ref 3.5–5.1)
Sodium: 138 mmol/L (ref 135–145)

## 2021-09-14 MED ORDER — AZITHROMYCIN 500 MG PO TABS
500.0000 mg | ORAL_TABLET | Freq: Every day | ORAL | Status: DC
  Administered 2021-09-15: 11:00:00 500 mg via ORAL
  Filled 2021-09-14: qty 1

## 2021-09-14 NOTE — TOC Progression Note (Signed)
Transition of Care [facility]) - Progression Note    Patient Details  Name: [name] [name] MRN: [id number] Date of Birth: 1928/06/21  [facility] CM/SW Contact  Caryn Section, RN Phone Number: 09/14/2021, 3:33 PM  Clinical Narrative:   As per daughter, patient can return to [facility], however [facility] is closed to admissions due to COVID         Expected Discharge Plan and Services                                                 Social Determinants of Health (SDOH) Interventions    Readmission Risk Interventions No flowsheet data found.

## 2021-09-14 NOTE — Progress Notes (Signed)
Physical Therapy Treatment Patient Details Name: [name] [name] MRN: [id number] DOB: [birth year] Today's Date: 09/14/2021   History of Present Illness [name] [name] is a 87 y.o. male here with complaints of low blood pressure and blurred vision at rehab facility. Recently admitted 12/2-8/22 for PNA. PMH: atrial fibrillation, diabetes mellitus, hyperlipidemia, hypertension, sepsis, acute kidney injury, hematuria, PAD, osteomyelitis, hypothyroidism, lung nodule, allergy to statins.    PT Comments    On arrival pt bed soaked in urine, PT assisted with cleaning him up and changing linen when he started to have a BM (unknown to him).  Assisted to [facility] (heavy assist to attain standing and turn); able to tolerate prolonged sitting on commode and later standing during subsequent extensive clean up but did fatigue quite a bit with this effort.  Pt too fatigue to attempt longer bout of ambulation but did some limited in room activity and seated exercises.  Nursing notified of course of session.  Recommendations for follow up therapy are one component of a multi-disciplinary discharge planning process, led by the attending physician.  Recommendations may be updated based on patient status, additional functional criteria and insurance authorization.  Follow Up Recommendations  Skilled nursing-short term rehab (<3 hours/day)     Assistance Recommended at Discharge Intermittent Supervision/Assistance  Equipment Recommendations  None recommended by PT    Recommendations for Other Services       Precautions / Restrictions Precautions Precautions: Fall Restrictions Weight Bearing Restrictions: No     Mobility  Bed Mobility Overal bed mobility: Needs Assistance Bed Mobility: Supine to Sit     Supine to sit: Min assist     General bed mobility comments: Pt showed good effort with getting to EOB, needed only light assist    Transfers Overall transfer level: Needs assistance Equipment used: Rolling  walker (2 wheels) Transfers: Sit to/from Stand Sit to Stand: Max assist           General transfer comment: sit to stand 4xt/o the session, needed considerable assist with each attempt. Even with much cuing for set up and encouragement pt's LE weakness was too profound to attain standing even from elevated surface.    Ambulation/Gait Ambulation/Gait assistance: Min guard Gait Distance (Feet): 20 Feet Assistive device: Rolling walker (2 wheels)         General Gait Details: Pt very fatigued (though O2 remained in high 90s, HR in the 60-70 range) during effort of having a bowel movement and the clean up involved with this so we deferred prolonged bout of ambulation and just did some minimal in-room ambulation   Stairs             Wheelchair Mobility    Modified Rankin (Stroke Patients Only)       Balance Overall balance assessment: Needs assistance Sitting-balance support: Feet supported;No upper extremity supported Sitting balance-Leahy Scale: Fair     Standing balance support: Bilateral upper extremity supported;Reliant on assistive device for balance Standing balance-Leahy Scale: Fair                              Cognition Arousal/Alertness: Awake/alert Behavior During Therapy: WFL for tasks assessed/performed Overall Cognitive Status: Within Functional Limits for tasks assessed                                 General Comments: Pt repeats himself t/o the session but alert and  able to hold reasonable conversation        Exercises General Exercises - Lower Extremity Ankle Circles/Pumps: PROM;5 reps;Both Long Arc Quad: Strengthening;10 reps;Both Heel Slides: Strengthening;10 reps;Both Hip ABduction/ADduction: Strengthening;10 reps;Both Hip Flexion/Marching: Strengthening;10 reps;Both    General Comments General comments (skin integrity, edema, etc.): Pt needed a lot of assist with getting cleaned up after BM.  He showed good  effort as he was able but was too weak and limited to assist very much.      Pertinent Vitals/Pain Pain Assessment: No/denies pain    Home Living                          Prior Function            PT Goals (current goals can now be found in the care plan section) Progress towards PT goals: Progressing toward goals    Frequency    Min 2X/week      PT Plan Current plan remains appropriate    Co-evaluation              AM-PAC PT "6 Clicks" Mobility   Outcome Measure  Help needed turning from your back to your side while in a flat bed without using bedrails?: A Little Help needed moving from lying on your back to sitting on the side of a flat bed without using bedrails?: A Little Help needed moving to and from a bed to a chair (including a wheelchair)?: A Little Help needed standing up from a chair using your arms (e.g., wheelchair or bedside chair)?: A Lot Help needed to walk in hospital room?: A Little Help needed climbing 3-5 steps with a railing? : A Lot 6 Click Score: 16    End of Session Equipment Utilized During Treatment: Gait belt Activity Tolerance: Patient limited by fatigue Patient left: with chair alarm set;with call bell/phone within reach;with nursing/sitter in room Nurse Communication: Mobility status (pt has BM) PT Visit Diagnosis: Other abnormalities of gait and mobility (R26.89);Muscle weakness (generalized) (M62.81);Difficulty in walking, not elsewhere classified (R26.2)     Time: [id number] PT Time Calculation (min) (ACUTE ONLY): 46 min  Charges:  $Gait Training: 8-22 mins $Therapeutic Exercise: 8-22 mins $Therapeutic Activity: 8-22 mins                     Kreg Shropshire, DPT 09/14/2021, 3:27 PM

## 2021-09-14 NOTE — Progress Notes (Signed)
PROGRESS NOTE    [name] [name]  GDJ:[id number] DOB: [birth year] DOA: 09/12/2021 PCP: Zenaida Niece, MD    Assessment & Plan:   Principal Problem:   Hypoxia Active Problems:   Hypertension   Hypothyroid   Anemia   Type 2 diabetes mellitus with hyperglycemia (HCC)   Blurred vision   AKI (acute kidney injury) (HCC)   Hypotension   SSS (sick sinus syndrome) (HCC)   H/O syncope   Atrial fibrillation, chronic (HCC)   Pressure injury of skin  Pneumonia: continue on IV rocephin, azithromycin, bronchodilators & encourage incentive spirometry. Was d/c on 08/24/21 for pneumonia was initially treated w/ zosyn & switched to po augmentin. 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 No need for transfusion currently   Blurred vision: etiology unclear. Denies any complaints. Will need to see ophthalmologist as an outpatient   CKDIIa: Cr is trending down daily. Avoid nephrotoxic meds   DM2: poorly controlled, HbA1c 7.1. Continue on SSI w/ accuchecks   HTN: continue to hold all home anti-HTN meds as BP is low normal   Sick sinus syndrome: s/p pacemaker. Hx of likely PAF. Continue on eliquis   Generalized weakness: likely d/c back to SNF, [facility]     DVT prophylaxis: eliquis Code Status: DNR Family Communication: discussed pt's care w/ pt's daughter, Victorino Dike, and answered her  Disposition Plan: possibly d/c to SNF  Level of care: Telemetry Medical  Status is: Inpatient  Remains inpatient appropriate because: severity of illness     Consultants:    Procedures:   Antimicrobials: rocephin, azithromycin    Subjective: Pt c/o generalized weakness  Objective: Vitals:   09/13/21 1147 09/13/21 1629 09/13/21 1940 09/14/21 0434  BP: (!) 111/44 134/61 (!) 122/47 (!) 134/49  Pulse: 60 61 (!) 59 (!) 59  Resp: 16 16 16 16   Temp: (!) 97.5 F (36.4 C) 97.6 F (36.4 C) (!) 97.5 F (36.4 C) (!) 97.4 F (36.3 C)  TempSrc:   Oral Oral  SpO2: 98% 100%  100% 99%  Weight:      Height:        Intake/Output Summary (Last 24 hours) at 09/14/2021 0731 Last data filed at 09/14/2021 0510 Gross per 24 hour  Intake 1197.05 ml  Output 500 ml  Net 697.05 ml   Filed Weights   09/12/21 1518  Weight: 92 kg    Examination:  General exam: Appears comfortable. Frail appearing  Respiratory system: diminished breath sounds b/l  Cardiovascular system: S1/S2+. No gallops or rubs  Gastrointestinal system: Abd is soft, NT, ND & normal bowel sounds  Central nervous system: alert and oriented. Moves all extremities Psychiatry: Judgement and insight appear normal. Flat mood and affect    Data Reviewed: I have personally reviewed following labs and imaging studies  CBC: Recent Labs  Lab 09/12/21 1519 09/13/21 0518 09/14/21 0509  WBC 17.5* 15.2* 8.2  HGB 10.4* 7.9* 8.5*  HCT 34.3* 26.6* 28.6*  MCV 77.4* 76.0* 76.7*  PLT 412* 315 294   Basic Metabolic Panel: Recent Labs  Lab 09/12/21 1519 09/13/21 0518 09/14/21 0509  NA 136 136 138  K 4.0 3.7 3.5  CL 101 105 105  CO2 28 25 26   GLUCOSE 205* 299* 139*  BUN 29* 32* 25*  CREATININE 1.40* 1.00 0.82  CALCIUM 8.6* 8.2* 8.6*   GFR: Estimated Creatinine Clearance: 68.1 mL/min (by C-G formula based on SCr of 0.82 mg/dL). Liver Function Tests: Recent Labs  Lab 09/13/21 0518  AST 19  ALT 12  ALKPHOS 71  BILITOT 0.7  PROT 5.5*  ALBUMIN 2.5*   No results for input(s): LIPASE, AMYLASE in the last 168 hours. No results for input(s): AMMONIA in the last 168 hours. Coagulation Profile: No results for input(s): INR, PROTIME in the last 168 hours. Cardiac Enzymes: No results for input(s): CKTOTAL, CKMB, CKMBINDEX, TROPONINI in the last 168 hours. BNP (last 3 results) No results for input(s): PROBNP in the last 8760 hours. HbA1C: No results for input(s): HGBA1C in the last 72 hours. CBG: No results for input(s): GLUCAP in the last 168 hours. Lipid Profile: No results for input(s):  CHOL, HDL, LDLCALC, TRIG, CHOLHDL, LDLDIRECT in the last 72 hours. Thyroid Function Tests: No results for input(s): TSH, T4TOTAL, FREET4, T3FREE, THYROIDAB in the last 72 hours. Anemia Panel: No results for input(s): VITAMINB12, FOLATE, FERRITIN, TIBC, IRON, RETICCTPCT in the last 72 hours. Sepsis Labs: Recent Labs  Lab 09/12/21 1519 09/12/21 2005  PROCALCITON 0.10  --   LATICACIDVEN 1.7 1.4    Recent Results (from the past 240 hour(s))  Blood culture (routine x 2)     Status: None (Preliminary result)   Collection Time: 09/12/21  8:06 PM   Specimen: BLOOD  Result Value Ref Range Status   Specimen Description BLOOD BLOOD RIGHT FOREARM  Final   Special Requests   Final    BOTTLES DRAWN AEROBIC AND ANAEROBIC Blood Culture adequate volume   Culture   Final    NO GROWTH 2 DAYS Performed at [facility], [address], [place], [place] [address]    Report Status PENDING  Incomplete  Blood culture (routine x 2)     Status: None (Preliminary result)   Collection Time: 09/12/21  8:06 PM   Specimen: BLOOD  Result Value Ref Range Status   Specimen Description BLOOD RIGHT ANTECUBITAL  Final   Special Requests   Final    BOTTLES DRAWN AEROBIC AND ANAEROBIC Blood Culture results may not be optimal due to an excessive volume of blood received in culture bottles   Culture   Final    NO GROWTH 2 DAYS Performed at [facility], [address] [place], [place] [address]    Report Status PENDING  Incomplete  Resp Panel by RT-PCR (Flu A&B, Covid) Nasopharyngeal Swab     Status: None   Collection Time: 09/12/21  8:31 PM   Specimen: Nasopharyngeal Swab; Nasopharyngeal(NP) swabs in vial transport medium  Result Value Ref Range Status   SARS Coronavirus 2 by RT PCR NEGATIVE NEGATIVE Final    Comment: (NOTE) SARS-CoV-2 target nucleic acids are NOT DETECTED.  The SARS-CoV-2 RNA is generally detectable in upper respiratory specimens during the acute phase of infection. The  lowest concentration of SARS-CoV-2 viral copies this assay can detect is 138 copies/mL. A negative result does not preclude SARS-Cov-2 infection and should not be used as the sole basis for treatment or other patient management decisions. A negative result may occur with  improper specimen collection/handling, submission of specimen other than nasopharyngeal swab, presence of viral mutation(s) within the areas targeted by this assay, and inadequate number of viral copies(<138 copies/mL). A negative result must be combined with clinical observations, patient history, and epidemiological information. The expected result is Negative.  Fact Sheet for Patients:  EntrepreneurPulse.com.au  Fact Sheet for Healthcare Providers:  IncredibleEmployment.be  This test is no t yet approved or cleared by the [place] FDA and  has been authorized for detection and/or diagnosis of SARS-CoV-2 by  FDA under an Emergency Use Authorization (EUA). This EUA will remain  in effect (meaning this test can be used) for the duration of the COVID-19 declaration under Section 564(b)(1) of the Act, 21 U.S.C.section 360bbb-3(b)(1), unless the authorization is terminated  or revoked sooner.       Influenza A by PCR NEGATIVE NEGATIVE Final   Influenza B by PCR NEGATIVE NEGATIVE Final    Comment: (NOTE) The Xpert Xpress SARS-CoV-2/FLU/RSV plus assay is intended as an aid in the diagnosis of influenza from Nasopharyngeal swab specimens and should not be used as a sole basis for treatment. Nasal washings and aspirates are unacceptable for Xpert Xpress SARS-CoV-2/FLU/RSV testing.  Fact Sheet for Patients: EntrepreneurPulse.com.au  Fact Sheet for Healthcare Providers: IncredibleEmployment.be  This test is not yet approved or cleared by the [place] FDA and has been authorized for detection and/or diagnosis of SARS-CoV-2 by FDA under  an Emergency Use Authorization (EUA). This EUA will remain in effect (meaning this test can be used) for the duration of the COVID-19 declaration under Section 564(b)(1) of the Act, 21 U.S.C. section 360bbb-3(b)(1), unless the authorization is terminated or revoked.  Performed at [facility], [address] [place], [place] [address]   MRSA Next Gen by PCR, Nasal     Status: None   Collection Time: 09/13/21  3:24 AM   Specimen: Nasal Mucosa; Nasal Swab  Result Value Ref Range Status   MRSA by PCR Next Gen NOT DETECTED NOT DETECTED Final    Comment: (NOTE) The GeneXpert MRSA Assay (FDA approved for NASAL specimens only), is one component of a comprehensive MRSA colonization surveillance program. It is not intended to diagnose MRSA infection nor to guide or monitor treatment for MRSA infections. Test performance is not FDA approved in patients less than 2 years old. Performed at [facility], [address], [place], [place] [address]          Radiology Studies: DG Chest Portable 1 View  Result Date: 09/12/2021 CLINICAL DATA:  Shortness of breath EXAM: PORTABLE CHEST 1 VIEW COMPARISON:  08/18/2021 FINDINGS: Left-sided pacing device as before. Small bilateral effusions with hazy bibasilar opacity. Stable cardiomediastinal silhouette with aortic atherosclerosis. IMPRESSION: 1. Small bilateral effusions with hazy atelectasis or pneumonia at the bases. 2. Mild cardiomegaly Electronically Signed   By: Donavan Foil M.D.   On: 09/12/2021 18:12        Scheduled Meds:  apixaban  2.5 mg Oral BID   cholecalciferol  1,000 Units Oral Daily   ezetimibe  10 mg Oral Daily   ferrous sulfate  325 mg Oral Q breakfast   finasteride  5 mg Oral Daily   gabapentin  300 mg Oral TID   levothyroxine  125 mcg Oral Q0600   pantoprazole  40 mg Oral Daily   psyllium  1 packet Oral Daily   sodium chloride flush  3 mL Intravenous Q12H   tamsulosin  0.4 mg Oral QHS   vitamin  B-12  1,000 mcg Oral Daily   zinc sulfate  220 mg Oral Daily   Continuous Infusions:  sodium chloride 50 mL/hr at 09/14/21 0510   azithromycin Stopped (09/13/21 1235)   cefTRIAXone (ROCEPHIN)  IV Stopped (09/13/21 1040)     LOS: 2 days    Time spent: 30 mins     Wyvonnia Dusky, MD [facility] Pager [phone]  If 7PM-7AM, please contact night-coverage 09/14/2021, 7:31 AM

## 2021-09-15 DIAGNOSIS — J189 Pneumonia, unspecified organism: Secondary | ICD-10-CM | POA: Diagnosis not present

## 2021-09-15 DIAGNOSIS — D500 Iron deficiency anemia secondary to blood loss (chronic): Secondary | ICD-10-CM | POA: Diagnosis not present

## 2021-09-15 DIAGNOSIS — E1169 Type 2 diabetes mellitus with other specified complication: Secondary | ICD-10-CM | POA: Diagnosis not present

## 2021-09-15 LAB — RESP PANEL BY RT-PCR (FLU A&B, COVID) ARPGX2
Influenza A by PCR: NEGATIVE
Influenza B by PCR: NEGATIVE
SARS Coronavirus 2 by RT PCR: NEGATIVE

## 2021-09-15 LAB — CBC
HCT: 27.7 % — ABNORMAL LOW (ref 39.0–52.0)
Hemoglobin: 8.1 g/dL — ABNORMAL LOW (ref 13.0–17.0)
MCH: 22.3 pg — ABNORMAL LOW (ref 26.0–34.0)
MCHC: 29.2 g/dL — ABNORMAL LOW (ref 30.0–36.0)
MCV: 76.1 fL — ABNORMAL LOW (ref 80.0–100.0)
Platelets: 309 10*3/uL (ref 150–400)
RBC: 3.64 MIL/uL — ABNORMAL LOW (ref 4.22–5.81)
RDW: 22.7 % — ABNORMAL HIGH (ref 11.5–15.5)
WBC: 6.5 10*3/uL (ref 4.0–10.5)
nRBC: 0 % (ref 0.0–0.2)

## 2021-09-15 LAB — BASIC METABOLIC PANEL
Anion gap: 6 (ref 5–15)
BUN: 20 mg/dL (ref 8–23)
CO2: 26 mmol/L (ref 22–32)
Calcium: 8.6 mg/dL — ABNORMAL LOW (ref 8.9–10.3)
Chloride: 109 mmol/L (ref 98–111)
Creatinine, Ser: 0.76 mg/dL (ref 0.61–1.24)
GFR, Estimated: >60 mL/min (ref >60)
Glucose, Bld: 106 mg/dL — ABNORMAL HIGH (ref 70–99)
Potassium: 3.6 mmol/L (ref 3.5–5.1)
Sodium: 141 mmol/L (ref 135–145)

## 2021-09-15 MED ORDER — AZITHROMYCIN 500 MG PO TABS
500.0000 mg | ORAL_TABLET | Freq: Every day | ORAL | 0 refills | Status: AC
Start: 2021-09-16 — End: 2021-09-17

## 2021-09-15 MED ORDER — APIXABAN 2.5 MG PO TABS: 2.5000 mg | ORAL_TABLET | Freq: Two times a day (BID) | ORAL | Status: AC

## 2021-09-15 NOTE — Discharge Summary (Signed)
Physician Discharge Summary  [name] [name] IEP:[id number] DOB: July 24, 1928 DOA: 09/12/2021  PCP: Zenaida Niece, MD  Admit date: 09/12/2021 Discharge date: 09/15/2021  Admitted From: [facility] Disposition: [facility]  Recommendations for Outpatient Follow-up:  Follow up with PCP in 1-2 weeks   Home Health: no  Equipment/Devices:  Discharge Condition: stable  CODE STATUS: full  Diet recommendation: Heart Healthy / Carb Modified   Brief/Interim Summary: HPI was taken from Dr. Irena Cords: [name] [name] is a 87 y.o. male seen in ed with complaints of low blood pressure and blurred vision.  Patient states that [facility] and is coming to [place] because when he used to get up he is noticed for the past few days that he was having blurred vision and is unclear about his visual abnormality and the nurses at the facility were trying to help him try to figure out and noticed that his oxygen was low and so he was brought to the emergency room.  HPI and review of systems is limited secondary to his hearing impaired state but he does look comfortable and in no distress and denies any chest pain fevers chills abdominal pain or any other complaints.  CODE STATUS verified with the patient he states that he misses his wife and sometimes he hears her voice and he has lived a very good life he is 87 years old he has 2 wonderful children and grandchildren and he is tired of getting in and out of the hospital and it he gets into a condition where his heart is not beating or he is not breathing he would like to be not resuscitated.  Patient at this time is alert awake oriented and Zollie Scale the ED nurse is at bedside.  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  States he is talks to both his children every day. Patient was recently admitted on August 18, 2021 and discharged on the August 24, 2021.   Admission was for seizure-like activities and question loss of consciousness.  Patient had a CT abdomen and pelvis that showed left lower lobe consolidation and treated with IV Zosyn and patient was started on Augmentin on a barium swallow done on admission showed trace aspiration per note daughter was aware to continue regular diet with thin liquids.  Patient also had anemia with a hemoglobin of 5.6 and patient was transfused and seen by GI with recommendations for monitoring and PPI therapy.  I suspect that patient had a syncopal episode from an acute GI bleed leading to hemorrhagic shock from acute blood loss anemia and lactic acidosis.   Pt has past medical history of atrial fibrillation, diabetes mellitus, hyperlipidemia, hypertension, sepsis, acute kidney injury, hematuria, PAD, osteomyelitis, hypothyroidism, lung nodule, allergy to statins.   Hospital course from Dr. Mayford Knife 12/28-12/30/22: Pt was found to have pneumonia that was treated IV rocephin, azithromycin, bronchodilators & incentive spirometry. Pt was d/c on 08/24/21 for pneumonia and was treated initially w/ zosyn and d/c to po augmentin. PT/OT evaluated the pt and recommend pt return to SNF.   Discharge Diagnoses:  Principal Problem:   Hypoxia Active Problems:   Hypertension   Hypothyroid   Anemia   Type 2 diabetes mellitus with hyperglycemia (HCC)   Blurred vision   AKI (acute kidney injury) (HCC)   Hypotension   SSS (sick sinus syndrome) (HCC)   H/O syncope  Atrial fibrillation, chronic (HCC)   Pressure injury of skin   Pneumonia: continue on IV rocephin, azithromycin. Continue on bronchodilators & encourage incentive spirometry. Pt was d/c home po azithromycin x 1 day more to complete the course. Was d/c on 08/24/21 for pneumonia was initially treated w/ zosyn & switched to po augmentin. 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 No need for transfusion currently   Blurred vision:  etiology unclear. Denies any complaints. Will need to see ophthalmologist as an outpatient   CKDIIa: Cr is better than baseline   DM2: poorly controlled, HbA1c 7.1. Continue on SSI w/ accuchecks   HTN: hold home BP meds until pt can see PCP as BP has been WNL   Sick sinus syndrome: s/p pacemaker. Hx of likely PAF. Continue on eliquis   Generalized weakness: likely d/c back to SNF[facility]   Discharge Instructions  Discharge Instructions     Diet - low sodium heart healthy   Complete by: As directed    Diet Carb Modified   Complete by: As directed    Discharge instructions   Complete by: As directed    F/u w/ PCP in 1-2 weeks   Increase activity slowly   Complete by: As directed    No wound care   Complete by: As directed       Allergies as of 09/15/2021       Reactions   Statins Palpitations        Medication List     TAKE these medications    acetaminophen 500 MG tablet Commonly known as: TYLENOL Take 500 mg by mouth every 8 (eight) hours as needed for mild pain.   apixaban 2.5 MG Tabs tablet Commonly known as: ELIQUIS Take 1 tablet (2.5 mg total) by mouth 2 (two) times daily. What changed:  medication strength how much to take how to take this when to take this additional instructions   azithromycin 500 MG tablet Commonly known as: ZITHROMAX Take 1 tablet (500 mg total) by mouth daily for 1 day. Start taking on: September 16, 2021   CALAZIME SKIN PROTECTANT EX Apply 1 application topically as needed (periwound area).   Cholecalciferol 25 MCG (1000 UT) tablet Take 1,000 mg by mouth daily.   cyanocobalamin 1000 MCG tablet Take 1,000 mcg by mouth daily.   diltiazem 180 MG 24 hr capsule Commonly known as: TIAZAC Hold until outpatient followup due to normal blood pressure without it.   ezetimibe 10 MG tablet Commonly known as: ZETIA Take 10 mg by mouth daily.   ferrous sulfate 325 (65 FE) MG tablet Take 1 tablet (325 mg total) by mouth daily  with breakfast.   finasteride 5 MG tablet Commonly known as: PROSCAR Take 5 mg by mouth daily.   furosemide 20 MG tablet Commonly known as: LASIX Hold until outpatient followup due to blood pressure normal without it.   gabapentin 300 MG capsule Commonly known as: NEURONTIN Take 300 mg by mouth daily.   levothyroxine 125 MCG tablet Commonly known as: SYNTHROID Take 125 mcg by mouth daily before breakfast.   Metamucil Smooth Texture 58.6 % powder Generic drug: psyllium Take 1 packet by mouth every evening.   multivitamin with minerals Tabs tablet Take 1 tablet by mouth daily.   pantoprazole 40 MG tablet Commonly known as: PROTONIX Take 1 tablet (40 mg total) by mouth daily.   sitaGLIPtin 100 MG tablet Commonly known as: JANUVIA Take 100 mg by mouth daily.  tamsulosin 0.4 MG Caps capsule Commonly known as: FLOMAX Take 0.4 mg by mouth at bedtime.   vitamin C 500 MG tablet Commonly known as: ASCORBIC ACID Take 500 mg by mouth 2 (two) times daily.   zinc sulfate 220 (50 Zn) MG capsule Take 220 mg by mouth daily.        Allergies  Allergen Reactions   Statins Palpitations    Consultations:   Procedures/Studies: CT ABDOMEN PELVIS WO CONTRAST  Result Date: 08/19/2021 CLINICAL DATA:  Abdominal distension EXAM: CT ABDOMEN AND PELVIS WITHOUT CONTRAST TECHNIQUE: Multidetector CT imaging of the abdomen and pelvis was performed following the standard protocol without IV contrast. COMPARISON:  09/10/2020 FINDINGS: Lower chest: Bilateral pleural effusions are noted right greater than left. Considerable material is noted within the lower lobe bronchial tree with associated patchy areas of consolidation. These changes could represent bilateral aspiration. Coronary calcifications are noted. Hepatobiliary: No focal liver abnormality is seen. Status post cholecystectomy. No biliary dilatation. Pancreas: Unremarkable. No pancreatic ductal dilatation or surrounding inflammatory  changes. Spleen: Normal in size without focal abnormality. Adrenals/Urinary Tract: Adrenal glands are within normal limits bilaterally. Kidneys are well visualized bilaterally without renal calculi or obstructive changes. The ureters are within normal limits. The bladder is partially distended. Hypodensities are noted within the left kidney likely representing cysts. This is stable from the prior exam. Stomach/Bowel: Circumferential wall thickening is noted within the rectum which may represent some focal proctitis. No perforation is noted. The more proximal colon is within normal limits. The cecum extends inferiorly into a right inguinal hernia along with a loop of terminal ileum. No obstructive changes are seen. The more proximal small bowel is within normal limits with the exception of a small duodenal diverticulum adjacent to the head of pancreas. Stomach is decompressed. Appendix has been surgically removed Vascular/Lymphatic: Aortic atherosclerosis. No enlarged abdominal or pelvic lymph nodes. Reproductive: Prostate is unremarkable. Other: No abdominal wall hernia or abnormality. No abdominopelvic ascites. Musculoskeletal: Degenerative changes of the lumbar spine are noted. IMPRESSION: Right inguinal hernia containing a loop of terminal ileum as well as the cecum. No associated obstruction is noted. Circumferential wall thickening within the rectum which may represent some mild proctitis. Bilateral pleural effusions with associated lower lobe consolidation. Increased soft tissue density is noted within the lower lobe bronchial tree bilaterally which may represent bilateral aspiration. Electronically Signed   By: Inez Catalina M.D.   On: 08/19/2021 00:58   CT HEAD WO CONTRAST (5MM)  Result Date: 08/19/2021 CLINICAL DATA:  Syncope, simple, normal neuro exam EXAM: CT HEAD WITHOUT CONTRAST TECHNIQUE: Contiguous axial images were obtained from the base of the skull through the vertex without intravenous  contrast. COMPARISON:  None. FINDINGS: Brain: There is no acute intracranial hemorrhage, mass effect, or edema. Gray-white differentiation is preserved. There is no extra-axial fluid collection. Prominence of the ventricles and sulci reflects parenchymal volume loss. Patchy and confluent hypoattenuation in the supratentorial white matter is nonspecific but probably reflects moderate chronic microvascular ischemic changes. Vascular: There is atherosclerotic calcification at the skull base. Skull: Calvarium is unremarkable. Sinuses/Orbits: Patchy mucosal thickening. Bilateral lens replacements. Other: None. IMPRESSION: No acute intracranial abnormality. Chronic microvascular ischemic changes. Electronically Signed   By: Macy Mis M.D.   On: 08/19/2021 16:29   US RENAL  Result Date: 08/20/2021 CLINICAL DATA:  Hematuria EXAM: RENAL / URINARY TRACT ULTRASOUND COMPLETE COMPARISON:  CT abdomen/pelvis dated 08/19/2021 FINDINGS: Right Kidney: Renal measurements: 10.7 x 6.0 x 5.8 cm = volume: 196 mL. Echogenic  renal parenchyma. No mass or hydronephrosis visualized. Left Kidney: Renal measurements: 11.2 x 6.8 x 6.5 cm = volume: 257 mL. Echogenic renal parenchyma. 3.0 x 2.3 x 2.5 cm interpolar cyst, simple. No hydronephrosis. Bladder: Layering debris in the bladder. Other: None. IMPRESSION: Layering debris in the bladder. No hydronephrosis. 3.0 cm simple left renal cyst, benign. Electronically Signed   By: Julian Hy M.D.   On: 08/20/2021 20:56   DG Chest Portable 1 View  Result Date: 09/12/2021 CLINICAL DATA:  Shortness of breath EXAM: PORTABLE CHEST 1 VIEW COMPARISON:  08/18/2021 FINDINGS: Left-sided pacing device as before. Small bilateral effusions with hazy bibasilar opacity. Stable cardiomediastinal silhouette with aortic atherosclerosis. IMPRESSION: 1. Small bilateral effusions with hazy atelectasis or pneumonia at the bases. 2. Mild cardiomegaly Electronically Signed   By: Donavan Foil M.D.   On:  09/12/2021 18:12   DG Chest Port 1 View  Result Date: 08/18/2021 CLINICAL DATA:  87 year old male with possible sepsis. EXAM: PORTABLE CHEST 1 VIEW COMPARISON:  No priors. FINDINGS: Lung volumes are low. Bibasilar opacities may reflect areas of atelectasis and/or consolidation. Moderate right and small left pleural effusions. No pneumothorax. No evidence of pulmonary edema. Heart size is mildly enlarged. Upper mediastinal contours are within normal limits. Left-sided pacemaker device in place with lead tips projecting over the expected location of the right atrium and right ventricle. IMPRESSION: 1. Bibasilar areas of atelectasis and/or consolidation with superimposed moderate right and small left pleural effusions. 2. Mild cardiomegaly. Electronically Signed   By: Vinnie Langton M.D.   On: 08/18/2021 16:20   EEG adult  Result Date: 08/21/2021 Derek Jack, MD     08/22/2021  2:50 PM Routine EEG Report [name] [name] is a 87 y.o. male with a history of spells who is undergoing an EEG to evaluate for seizures. Report: This EEG was acquired with electrodes placed according to the International 10-20 electrode system (including Fp1, Fp2, F3, F4, C3, C4, P3, P4, O1, O2, T3, T4, T5, T6, A1, A2, Fz, Cz, Pz). The following electrodes were missing or displaced: none. The occipital dominant rhythm was 8.5 Hz. This activity is reactive to stimulation. Drowsiness was manifested by background fragmentation; deeper stages of sleep were identified by K complexes and sleep spindles. There was no focal slowing. There were no interictal epileptiform discharges. There were no electrographic seizures identified. There was no abnormal response to photic stimulation or hyperventilation. Impression: This EEG was obtained while awake and asleep and is normal.   Clinical Correlation: Normal EEGs, however, do not rule out epilepsy. Su Monks, MD [facility] [phone] If 7pm- 7am, please page neurology on call as  listed in [place].   (Echo, Carotid, EGD, Colonoscopy, ERCP)    Subjective: Pt denies any complaints    Discharge Exam: Vitals:   09/15/21 0731 09/15/21 1208  BP: (!) 129/54 (!) 146/51  Pulse: (!) 59 (!) 41  Resp: 16 16  Temp: (!) 97.3 F (36.3 C) (!) 97.4 F (36.3 C)  SpO2: 100% 98%   Vitals:   09/14/21 2044 09/15/21 0510 09/15/21 0731 09/15/21 1208  BP: (!) 130/58 (!) 123/52 (!) 129/54 (!) 146/51  Pulse: 60 60 (!) 59 (!) 41  Resp: 18 18 16 16   Temp: 98 F (36.7 C) 97.6 F (36.4 C) (!) 97.3 F (36.3 C) (!) 97.4 F (36.3 C)  TempSrc: Oral Oral Oral Oral  SpO2: 98% 94% 100% 98%  Weight:      Height:  General: Pt is alert, awake, not in acute distress Cardiovascular: S1/S2 +, no rubs, no gallops Respiratory: decreased breath sounds b/l  Abdominal: Soft, NT, ND, bowel sounds + Extremities: no edema, no cyanosis    The results of significant diagnostics from this hospitalization (including imaging, microbiology, ancillary and laboratory) are listed below for reference.     Microbiology: Recent Results (from the past 240 hour(s))  Blood culture (routine x 2)     Status: None (Preliminary result)   Collection Time: 09/12/21  8:06 PM   Specimen: BLOOD  Result Value Ref Range Status   Specimen Description BLOOD BLOOD RIGHT FOREARM  Final   Special Requests   Final    BOTTLES DRAWN AEROBIC AND ANAEROBIC Blood Culture adequate volume   Culture   Final    NO GROWTH 3 DAYS Performed at [facility], [address] [place], [place] [address]    Report Status PENDING  Incomplete  Blood culture (routine x 2)     Status: None (Preliminary result)   Collection Time: 09/12/21  8:06 PM   Specimen: BLOOD  Result Value Ref Range Status   Specimen Description BLOOD RIGHT ANTECUBITAL  Final   Special Requests   Final    BOTTLES DRAWN AEROBIC AND ANAEROBIC Blood Culture results may not be optimal due to an excessive volume of blood received in culture bottles    Culture   Final    NO GROWTH 3 DAYS Performed at [facility], [address], [place], [place] [address]    Report Status PENDING  Incomplete  Resp Panel by RT-PCR (Flu A&B, Covid) Nasopharyngeal Swab     Status: None   Collection Time: 09/12/21  8:31 PM   Specimen: Nasopharyngeal Swab; Nasopharyngeal(NP) swabs in vial transport medium  Result Value Ref Range Status   SARS Coronavirus 2 by RT PCR NEGATIVE NEGATIVE Final    Comment: (NOTE) SARS-CoV-2 target nucleic acids are NOT DETECTED.  The SARS-CoV-2 RNA is generally detectable in upper respiratory specimens during the acute phase of infection. The lowest concentration of SARS-CoV-2 viral copies this assay can detect is 138 copies/mL. A negative result does not preclude SARS-Cov-2 infection and should not be used as the sole basis for treatment or other patient management decisions. A negative result may occur with  improper specimen collection/handling, submission of specimen other than nasopharyngeal swab, presence of viral mutation(s) within the areas targeted by this assay, and inadequate number of viral copies(<138 copies/mL). A negative result must be combined with clinical observations, patient history, and epidemiological information. The expected result is Negative.  Fact Sheet for Patients:  EntrepreneurPulse.com.au  Fact Sheet for Healthcare Providers:  IncredibleEmployment.be  This test is no t yet approved or cleared by the [place] FDA and  has been authorized for detection and/or diagnosis of SARS-CoV-2 by FDA under an Emergency Use Authorization (EUA). This EUA will remain  in effect (meaning this test can be used) for the duration of the COVID-19 declaration under Section 564(b)(1) of the Act, 21 U.S.C.section 360bbb-3(b)(1), unless the authorization is terminated  or revoked sooner.       Influenza A by PCR NEGATIVE NEGATIVE Final   Influenza B by  PCR NEGATIVE NEGATIVE Final    Comment: (NOTE) The Xpert Xpress SARS-CoV-2/FLU/RSV plus assay is intended as an aid in the diagnosis of influenza from Nasopharyngeal swab specimens and should not be used as a sole basis for treatment. Nasal washings and aspirates are unacceptable for Xpert Xpress SARS-CoV-2/FLU/RSV testing.  Fact Sheet for Patients: EntrepreneurPulse.com.au  Fact Sheet for Healthcare Providers: IncredibleEmployment.be  This test is not yet approved or cleared by the [place] FDA and has been authorized for detection and/or diagnosis of SARS-CoV-2 by FDA under an Emergency Use Authorization (EUA). This EUA will remain in effect (meaning this test can be used) for the duration of the COVID-19 declaration under Section 564(b)(1) of the Act, 21 U.S.C. section 360bbb-3(b)(1), unless the authorization is terminated or revoked.  Performed at [facility], [address] [place], [place] [address]   MRSA Next Gen by PCR, Nasal     Status: None   Collection Time: 09/13/21  3:24 AM   Specimen: Nasal Mucosa; Nasal Swab  Result Value Ref Range Status   MRSA by PCR Next Gen NOT DETECTED NOT DETECTED Final    Comment: (NOTE) The GeneXpert MRSA Assay (FDA approved for NASAL specimens only), is one component of a comprehensive MRSA colonization surveillance program. It is not intended to diagnose MRSA infection nor to guide or monitor treatment for MRSA infections. Test performance is not FDA approved in patients less than 63 years old. Performed at [facility], [place],  [address]   Resp Panel by RT-PCR (Flu A&B, Covid) Nasopharyngeal Swab     Status: None   Collection Time: 09/15/21 12:56 PM   Specimen: Nasopharyngeal Swab; Nasopharyngeal(NP) swabs in vial transport medium  Result Value Ref Range Status   SARS Coronavirus 2 by RT PCR NEGATIVE NEGATIVE Final    Comment:  (NOTE) SARS-CoV-2 target nucleic acids are NOT DETECTED.  The SARS-CoV-2 RNA is generally detectable in upper respiratory specimens during the acute phase of infection. The lowest concentration of SARS-CoV-2 viral copies this assay can detect is 138 copies/mL. A negative result does not preclude SARS-Cov-2 infection and should not be used as the sole basis for treatment or other patient management decisions. A negative result may occur with  improper specimen collection/handling, submission of specimen other than nasopharyngeal swab, presence of viral mutation(s) within the areas targeted by this assay, and inadequate number of viral copies(<138 copies/mL). A negative result must be combined with clinical observations, patient history, and epidemiological information. The expected result is Negative.  Fact Sheet for Patients:  EntrepreneurPulse.com.au  Fact Sheet for Healthcare Providers:  IncredibleEmployment.be  This test is no t yet approved or cleared by the [place] FDA and  has been authorized for detection and/or diagnosis of SARS-CoV-2 by FDA under an Emergency Use Authorization (EUA). This EUA will remain  in effect (meaning this test can be used) for the duration of the COVID-19 declaration under Section 564(b)(1) of the Act, 21 U.S.C.section 360bbb-3(b)(1), unless the authorization is terminated  or revoked sooner.       Influenza A by PCR NEGATIVE NEGATIVE Final   Influenza B by PCR NEGATIVE NEGATIVE Final    Comment: (NOTE) The Xpert Xpress SARS-CoV-2/FLU/RSV plus assay is intended as an aid in the diagnosis of influenza from Nasopharyngeal swab specimens and should not be used as a sole basis for treatment. Nasal washings and aspirates are unacceptable for Xpert Xpress SARS-CoV-2/FLU/RSV testing.  Fact Sheet for Patients: EntrepreneurPulse.com.au  Fact Sheet for Healthcare  Providers: IncredibleEmployment.be  This test is not yet approved or cleared by the [place] FDA and has been authorized for detection and/or diagnosis of SARS-CoV-2 by FDA under an Emergency Use Authorization (EUA). This EUA will remain in effect (meaning this test can be used) for the duration of the  COVID-19 declaration under Section 564(b)(1) of the Act, 21 U.S.C. section 360bbb-3(b)(1), unless the authorization is terminated or revoked.  Performed at [facility], [address] [place], [place] [address]      Labs: BNP (last 3 results) No results for input(s): BNP in the last 8760 hours. Basic Metabolic Panel: Recent Labs  Lab 09/12/21 1519 09/13/21 0518 09/14/21 0509 09/15/21 0523  NA 136 136 138 141  K 4.0 3.7 3.5 3.6  CL 101 105 105 109  CO2 28 25 26 26   GLUCOSE 205* 299* 139* 106*  BUN 29* 32* 25* 20  CREATININE 1.40* 1.00 0.82 0.76  CALCIUM 8.6* 8.2* 8.6* 8.6*   Liver Function Tests: Recent Labs  Lab 09/13/21 0518  AST 19  ALT 12  ALKPHOS 71  BILITOT 0.7  PROT 5.5*  ALBUMIN 2.5*   No results for input(s): LIPASE, AMYLASE in the last 168 hours. No results for input(s): AMMONIA in the last 168 hours. CBC: Recent Labs  Lab 09/12/21 1519 09/13/21 0518 09/14/21 0509 09/15/21 0523  WBC 17.5* 15.2* 8.2 6.5  HGB 10.4* 7.9* 8.5* 8.1*  HCT 34.3* 26.6* 28.6* 27.7*  MCV 77.4* 76.0* 76.7* 76.1*  PLT 412* 315 294 309   Cardiac Enzymes: No results for input(s): CKTOTAL, CKMB, CKMBINDEX, TROPONINI in the last 168 hours. BNP: Invalid input(s): POCBNP CBG: No results for input(s): GLUCAP in the last 168 hours. D-Dimer No results for input(s): DDIMER in the last 72 hours. Hgb A1c No results for input(s): HGBA1C in the last 72 hours. Lipid Profile No results for input(s): CHOL, HDL, LDLCALC, TRIG, CHOLHDL, LDLDIRECT in the last 72 hours. Thyroid function studies No results for input(s): TSH, T4TOTAL, T3FREE, THYROIDAB  in the last 72 hours.  Invalid input(s): FREET3 Anemia work up No results for input(s): VITAMINB12, FOLATE, FERRITIN, TIBC, IRON, RETICCTPCT in the last 72 hours. Urinalysis    Component Value Date/Time   COLORURINE RED (A) 08/20/2021 1300   APPEARANCEUR TURBID (A) 08/20/2021 1300   LABSPEC 1.026 08/20/2021 1300   PHURINE  08/20/2021 1300    TEST NOT REPORTED DUE TO COLOR INTERFERENCE OF URINE PIGMENT   GLUCOSEU (A) 08/20/2021 1300    TEST NOT REPORTED DUE TO COLOR INTERFERENCE OF URINE PIGMENT   HGBUR (A) 08/20/2021 1300    TEST NOT REPORTED DUE TO COLOR INTERFERENCE OF URINE PIGMENT   BILIRUBINUR (A) 08/20/2021 1300    TEST NOT REPORTED DUE TO COLOR INTERFERENCE OF URINE PIGMENT   KETONESUR (A) 08/20/2021 1300    TEST NOT REPORTED DUE TO COLOR INTERFERENCE OF URINE PIGMENT   PROTEINUR (A) 08/20/2021 1300    TEST NOT REPORTED DUE TO COLOR INTERFERENCE OF URINE PIGMENT   NITRITE (A) 08/20/2021 1300    TEST NOT REPORTED DUE TO COLOR INTERFERENCE OF URINE PIGMENT   LEUKOCYTESUR (A) 08/20/2021 1300    TEST NOT REPORTED DUE TO COLOR INTERFERENCE OF URINE PIGMENT   Sepsis Labs Invalid input(s): PROCALCITONIN,  WBC,  LACTICIDVEN Microbiology Recent Results (from the past 240 hour(s))  Blood culture (routine x 2)     Status: None (Preliminary result)   Collection Time: 09/12/21  8:06 PM   Specimen: BLOOD  Result Value Ref Range Status   Specimen Description BLOOD BLOOD RIGHT FOREARM  Final   Special Requests   Final    BOTTLES DRAWN AEROBIC AND ANAEROBIC Blood Culture adequate volume   Culture   Final    NO GROWTH 3 DAYS Performed at [facility], [address] [place], [place] [address]    Report Status PENDING  Incomplete  Blood culture (routine x 2)     Status: None (Preliminary result)   Collection Time: 09/12/21  8:06 PM   Specimen: BLOOD  Result Value Ref Range Status   Specimen Description BLOOD RIGHT ANTECUBITAL  Final   Special Requests   Final     BOTTLES DRAWN AEROBIC AND ANAEROBIC Blood Culture results may not be optimal due to an excessive volume of blood received in culture bottles   Culture   Final    NO GROWTH 3 DAYS Performed at [facility], [address] [place], [place] [address]    Report Status PENDING  Incomplete  Resp Panel by RT-PCR (Flu A&B, Covid) Nasopharyngeal Swab     Status: None   Collection Time: 09/12/21  8:31 PM   Specimen: Nasopharyngeal Swab; Nasopharyngeal(NP) swabs in vial transport medium  Result Value Ref Range Status   SARS Coronavirus 2 by RT PCR NEGATIVE NEGATIVE Final    Comment: (NOTE) SARS-CoV-2 target nucleic acids are NOT DETECTED.  The SARS-CoV-2 RNA is generally detectable in upper respiratory specimens during the acute phase of infection. The lowest concentration of SARS-CoV-2 viral copies this assay can detect is 138 copies/mL. A negative result does not preclude SARS-Cov-2 infection and should not be used as the sole basis for treatment or other patient management decisions. A negative result may occur with  improper specimen collection/handling, submission of specimen other than nasopharyngeal swab, presence of viral mutation(s) within the areas targeted by this assay, and inadequate number of viral copies(<138 copies/mL). A negative result must be combined with clinical observations, patient history, and epidemiological information. The expected result is Negative.  Fact Sheet for Patients:  EntrepreneurPulse.com.au  Fact Sheet for Healthcare Providers:  IncredibleEmployment.be  This test is no t yet approved or cleared by the [place] FDA and  has been authorized for detection and/or diagnosis of SARS-CoV-2 by FDA under an Emergency Use Authorization (EUA). This EUA will remain  in effect (meaning this test can be used) for the duration of the COVID-19 declaration under Section 564(b)(1) of the Act, 21 U.S.C.section  360bbb-3(b)(1), unless the authorization is terminated  or revoked sooner.       Influenza A by PCR NEGATIVE NEGATIVE Final   Influenza B by PCR NEGATIVE NEGATIVE Final    Comment: (NOTE) The Xpert Xpress SARS-CoV-2/FLU/RSV plus assay is intended as an aid in the diagnosis of influenza from Nasopharyngeal swab specimens and should not be used as a sole basis for treatment. Nasal washings and aspirates are unacceptable for Xpert Xpress SARS-CoV-2/FLU/RSV testing.  Fact Sheet for Patients: EntrepreneurPulse.com.au  Fact Sheet for Healthcare Providers: IncredibleEmployment.be  This test is not yet approved or cleared by the [place] FDA and has been authorized for detection and/or diagnosis of SARS-CoV-2 by FDA under an Emergency Use Authorization (EUA). This EUA will remain in effect (meaning this test can be used) for the duration of the COVID-19 declaration under Section 564(b)(1) of the Act, 21 U.S.C. section 360bbb-3(b)(1), unless the authorization is terminated or revoked.  Performed at [facility], [address] [place], [place] [address]   MRSA Next Gen by PCR, Nasal     Status: None   Collection Time: 09/13/21  3:24 AM   Specimen: Nasal Mucosa; Nasal Swab  Result Value Ref Range Status   MRSA by PCR Next Gen NOT DETECTED NOT DETECTED Final    Comment: (NOTE) The GeneXpert MRSA  Assay (FDA approved for NASAL specimens only), is one component of a comprehensive MRSA colonization surveillance program. It is not intended to diagnose MRSA infection nor to guide or monitor treatment for MRSA infections. Test performance is not FDA approved in patients less than 62 years old. Performed at [facility], [address] [place], [place] [address]   Resp Panel by RT-PCR (Flu A&B, Covid) Nasopharyngeal Swab     Status: None   Collection Time: 09/15/21 12:56 PM   Specimen: Nasopharyngeal Swab; Nasopharyngeal(NP)  swabs in vial transport medium  Result Value Ref Range Status   SARS Coronavirus 2 by RT PCR NEGATIVE NEGATIVE Final    Comment: (NOTE) SARS-CoV-2 target nucleic acids are NOT DETECTED.  The SARS-CoV-2 RNA is generally detectable in upper respiratory specimens during the acute phase of infection. The lowest concentration of SARS-CoV-2 viral copies this assay can detect is 138 copies/mL. A negative result does not preclude SARS-Cov-2 infection and should not be used as the sole basis for treatment or other patient management decisions. A negative result may occur with  improper specimen collection/handling, submission of specimen other than nasopharyngeal swab, presence of viral mutation(s) within the areas targeted by this assay, and inadequate number of viral copies(<138 copies/mL). A negative result must be combined with clinical observations, patient history, and epidemiological information. The expected result is Negative.  Fact Sheet for Patients:  BloggerCourse.com  Fact Sheet for Healthcare Providers:  SeriousBroker.it  This test is no t yet approved or cleared by the [place] FDA and  has been authorized for detection and/or diagnosis of SARS-CoV-2 by FDA under an Emergency Use Authorization (EUA). This EUA will remain  in effect (meaning this test can be used) for the duration of the COVID-19 declaration under Section 564(b)(1) of the Act, 21 U.S.C.section 360bbb-3(b)(1), unless the authorization is terminated  or revoked sooner.       Influenza A by PCR NEGATIVE NEGATIVE Final   Influenza B by PCR NEGATIVE NEGATIVE Final    Comment: (NOTE) The Xpert Xpress SARS-CoV-2/FLU/RSV plus assay is intended as an aid in the diagnosis of influenza from Nasopharyngeal swab specimens and should not be used as a sole basis for treatment. Nasal washings and aspirates are unacceptable for Xpert Xpress  SARS-CoV-2/FLU/RSV testing.  Fact Sheet for Patients: BloggerCourse.com  Fact Sheet for Healthcare Providers: SeriousBroker.it  This test is not yet approved or cleared by the [place] FDA and has been authorized for detection and/or diagnosis of SARS-CoV-2 by FDA under an Emergency Use Authorization (EUA). This EUA will remain in effect (meaning this test can be used) for the duration of the COVID-19 declaration under Section 564(b)(1) of the Act, 21 U.S.C. section 360bbb-3(b)(1), unless the authorization is terminated or revoked.  Performed at [facility], [address], [place], [place] [address]      Time coordinating discharge: Over 30 minutes  SIGNED:   Charise Killian, MD  [facility] 09/15/2021, 2:21 PM Pager   If 7PM-7AM, please contact night-coverage

## 2021-09-15 NOTE — Care Management Important Message (Signed)
Important Message  Patient Details  Name: Williard Keller MRN: [id number] Date of Birth: 06-May-1928   Medicare Important Message Given:  Yes  I reviewed the Important Message from Medicare with the patient's HCPOA, [name] (daughter) by phone [phone]) and she is an agreement with discharge and stated no copy needed.  I thanked her for her time.   [name] A Tywaun Hiltner 09/15/2021, 2:21 PM

## 2021-09-15 NOTE — TOC Progression Note (Addendum)
Transition of Care [facility]) - Progression Note    Patient Details  Name: [name] [name] MRN: [id number] Date of Birth: [birth year]  Transition of Care [facility]) CM/SW Contact  Caryn Section, RN Phone Number: 09/15/2021, 9:17 AM  Clinical Narrative:   RNCM spoke to Tammy at [facility], they are able to have patient return on Monday.  Left message for daughter, care team aware.  Addendum:  [facility] stated they can take patient today, COVID test ordered, awaiting results, updated care team       Expected Discharge Plan and Services                                                 Social Determinants of Health (SDOH) Interventions    Readmission Risk Interventions No flowsheet data found.

## 2021-09-15 NOTE — Progress Notes (Signed)
Physical Therapy Treatment Patient Details Name: [name] [name] MRN: [id number] DOB: Jan 25, 1928 Today's Date: 09/15/2021   History of Present Illness [name] [name] is a 87 y.o. male here with complaints of low blood pressure and blurred vision at rehab facility. Recently admitted 12/2-8/22 for PNA. PMH: atrial fibrillation, diabetes mellitus, hyperlipidemia, hypertension, sepsis, acute kidney injury, hematuria, PAD, osteomyelitis, hypothyroidism, lung nodule, allergy to statins.    PT Comments    Pt was pleasant and motivated to participate during the session and put forth good effort throughout. Pt required no physical assistance during the session and was able to amb 150 feet with no LOB and with HR and SpO2 WNL.  Pt reported no adverse symptoms while in standing or during ambulation and reported amb of 150' as "easy to medium hard".  Pt made good progress towards goals this session and would benefit from HHPT upon discharge at his ALF to safely address deficits listed in patient problem list for decreased caregiver assistance and eventual return to PLOF.     Recommendations for follow up therapy are one component of a multi-disciplinary discharge planning process, led by the attending physician.  Recommendations may be updated based on patient status, additional functional criteria and insurance authorization.  Follow Up Recommendations  Home health PT     Assistance Recommended at Discharge Intermittent Supervision/Assistance  Equipment Recommendations  None recommended by PT    Recommendations for Other Services       Precautions / Restrictions Precautions Precautions: Fall Precaution Comments: orthostatic Restrictions Weight Bearing Restrictions: No     Mobility  Bed Mobility Overal bed mobility: Modified Independent             General bed mobility comments: Extra time and effort only with bed mobility tasks    Transfers Overall transfer level: Needs  assistance Equipment used: Rolling walker (2 wheels) Transfers: Sit to/from Stand Sit to Stand: Min guard           General transfer comment: Extra time and effort to come to standing but no physical assistance required    Ambulation/Gait Ambulation/Gait assistance: Min guard Gait Distance (Feet): 150 Feet Assistive device: Rolling walker (2 wheels) Gait Pattern/deviations: Step-through pattern;Decreased dorsiflexion - left;Decreased dorsiflexion - right Gait velocity: decreased     General Gait Details: Min verbal cues for amb closer to the RW with pt able to amb without adverse symptoms and with HR and SpO2 WNL   Stairs             Wheelchair Mobility    Modified Rankin (Stroke Patients Only)       Balance Overall balance assessment: Needs assistance Sitting-balance support: Feet supported;No upper extremity supported Sitting balance-Leahy Scale: Good     Standing balance support: Bilateral upper extremity supported;During functional activity Standing balance-Leahy Scale: Fair Standing balance comment: Does rely on UE's on RW for support.                            Cognition Arousal/Alertness: Awake/alert Behavior During Therapy: WFL for tasks assessed/performed Overall Cognitive Status: Within Functional Limits for tasks assessed                                          Exercises Total Joint Exercises Quad Sets: Strengthening;Both;5 reps;10 reps Gluteal Sets: Strengthening;Both;5 reps;10 reps Heel Slides: AROM;Strengthening;Both;10 reps Hip ABduction/ADduction: AROM;Strengthening;Both;10 reps  Straight Leg Raises: AROM;Strengthening;Both;10 reps Long Arc Quad: Strengthening;Both;10 reps Marching in Standing: AROM;Strengthening;Both;5 reps;Standing    General Comments        Pertinent Vitals/Pain Pain Assessment: No/denies pain    Home Living                          Prior Function            PT  Goals (current goals can now be found in the care plan section) Progress towards PT goals: Progressing toward goals    Frequency    Min 2X/week      PT Plan Discharge plan needs to be updated    Co-evaluation              AM-PAC PT "6 Clicks" Mobility   Outcome Measure  Help needed turning from your back to your side while in a flat bed without using bedrails?: None Help needed moving from lying on your back to sitting on the side of a flat bed without using bedrails?: None Help needed moving to and from a bed to a chair (including a wheelchair)?: A Little Help needed standing up from a chair using your arms (e.g., wheelchair or bedside chair)?: A Little Help needed to walk in hospital room?: A Little Help needed climbing 3-5 steps with a railing? : A Lot 6 Click Score: 19    End of Session Equipment Utilized During Treatment: Gait belt Activity Tolerance: Patient limited by fatigue Patient left: with call bell/phone within reach;in bed;with bed alarm set;Other (comment) (Pt declined up in chair) Nurse Communication: Mobility status PT Visit Diagnosis: Other abnormalities of gait and mobility (R26.89);Muscle weakness (generalized) (M62.81);Difficulty in walking, not elsewhere classified (R26.2)     Time: 7619-5093 PT Time Calculation (min) (ACUTE ONLY): 26 min  Charges:  $Gait Training: 8-22 mins $Therapeutic Exercise: 8-22 mins                     D. Scott Merlyn Bollen PT, DPT 09/15/21, 4:05 PM

## 2021-09-15 NOTE — Progress Notes (Addendum)
Patient is being discharged back to [facility]. Discharge instructions placed in packet for EMS Transportation. IV has been removed. I called [facility] twice to try and give report and was unable to speak with anyone when EMS arrived. I gave EMS saff a report for patient. I called daughter as requested to let her know her father has left facility.

## 2021-09-17 LAB — CULTURE, BLOOD (ROUTINE X 2)
Culture: NO GROWTH
Culture: NO GROWTH
Special Requests: ADEQUATE

## 2021-09-26 ENCOUNTER — Other Ambulatory Visit: Payer: Self-pay

## 2021-09-26 ENCOUNTER — Non-Acute Institutional Stay: Payer: Medicare Other | Admitting: Primary Care

## 2021-09-26 VITALS — BP 170/83 | HR 59 | Ht 71.0 in | Wt 143.0 lb

## 2021-09-26 DIAGNOSIS — I739 Peripheral vascular disease, unspecified: Secondary | ICD-10-CM

## 2021-09-26 DIAGNOSIS — I10 Essential (primary) hypertension: Secondary | ICD-10-CM

## 2021-09-26 DIAGNOSIS — J189 Pneumonia, unspecified organism: Secondary | ICD-10-CM

## 2021-09-26 DIAGNOSIS — R5381 Other malaise: Secondary | ICD-10-CM

## 2021-09-26 NOTE — Progress Notes (Signed)
Therapist, nutritional Palliative Care Consult Note Telephone: [phone]  Fax: [phone]   Date of encounter: 09/26/21 10:15 AM PATIENT NAME: [name] [name] [address] [place] [address]   [phone] (home)  DOB: [birth year] MRN: [id number] PRIMARY CARE PROVIDER:    Zenaida Niece, MD,  [address] [place] [address] [phone]  REFERRING PROVIDER:   Zenaida Niece, MD [address] [place],  [place] [address] [phone]  RESPONSIBLE PARTY:    Contact Information     Name Relation Home Work Mobile   Payne,Jennifer Daughter [phone]  [phone]   Youcef, Klas  [phone]   Kristin Bruins  [phone]         I met face to face with [facility]. Palliative Care was asked to follow this patient by consultation request of  Zenaida Niece, MD to address advance care planning and complex medical decision making. This is the initial visit.                                     ASSESSMENT AND PLAN / RECOMMENDATIONS:   Advance Care Planning/Goals of Care: Goals include to maximize quality of life and symptom management. Patient/health care surrogate gave his/her permission to discuss.Our advance care planning conversation included a discussion about:    The value and importance of advance care planning  Experiences with loved ones who have been seriously ill or have died  Exploration of personal, cultural or spiritual beliefs that might influence medical decisions  Patient is a [place] [employer] veteran Performs life review, wife died 14 months ago and states he is ready and does not want to be prolonged. Exploration of goals of care in the event of a sudden injury or illness  Identification of a healthcare agent - Son and daughter help but are in [place] and [place]. Son Ed joined on call from [place] for goals of care discussion. Review  of an  advance directive document - Has DNR and outlines de escalation of care, to focus  on comfort and support. Decision  to de-escalate disease focused treatments due to poor prognosis.would like hospice once rehab is done and when eligible. He has lost 30% of weight at this writing in 1-2 months CODE STATUS: DNR  I spent 20 minutes providing this consultation. More than 50% of the time in this consultation was spent in counseling and care coordination.  ---------------------------------------------------------------------------------------------------------------- Symptom Management/Plan:  Glucose  control: Generally WNL, staff monitoring. Has been on prednisone x 1 week which may be elevating bg.   Incontinent of bowel/ bladder: Uses  briefs, endorses incontinence of some duration.  Fatigue: Endorse since pneumonias back to back in Dec. States even short ambulation causes him fatigue and he returns to be. Undergoing rehab currently. Discussed d/c to ALF when finished with home health, additional care givers, or hospice if eligible.Nutrition:   Nutrition: Albumin 3 weeks ago = 2.5, endorses good appetite, intake 75%. Wt loss recorded however as 40 lbs in a month. This should be verified as it is nearly 30%. Current recorded in SNF is 143 lbs.  Getting prostat, should get glucerna on trays tid as well.  PNA: Recent lab reports neg for influenza a and b, covid 19 virus and Rsv. Finishing abx today.  HTN:See below, elevated. Pt diltiazem has been help and may need to be added back, per building PCP.  Recommend  reassessing BP rx plan. Is followed by cardiology at [facility].   Follow up Palliative Care Visit: Palliative care will continue to follow for complex medical decision making, advance care planning, and clarification of goals. Return 1-2 weeks or prn.  This visit was coded based on medical decision making (MDM).  PPS: 40%  HOSPICE ELIGIBILITY/DIAGNOSIS: yes with concordant goals of care.  Chief Complaint: debility  HISTORY OF PRESENT ILLNESS:  [name] [name] is a 88 y.o.  year old male  with debility, frailty, recent pneumonia infections x 2, sepsis and PAD. Presents today to discuss PC following for de escalation of interventions and EOL care. Patient is frail but making slow and steady progress to gain strength and intake is good.    History obtained from review of EMR, discussion with primary team, and interview with family, facility staff/caregiver and/or [name] [name].  I reviewed available labs, medications, imaging, studies and related documents from the EMR.  Records reviewed and summarized above.   ROS  General EYES: denies vision changes, has glasses ENMT: endorses dysphagia, ST working with patient Cardiovascular: denies chest pain, endorses DOE Pulmonary: endorses cough, endorses increased SOB Abdomen: endorses good appetite, denies constipation, endorses incontinence of bowel GU: denies dysuria, endorses incontinence of urine MSK:  endorses increased weakness,  no falls reported Skin: denies rashes or wounds Neurological: denies pain, denies insomnia Psych: Endorses positive mood Heme/lymph/immuno: denies bruises, abnormal bleeding  Physical Exam: Current and past weights: 143 lbs Constitutional: 170/83 HR 59 18 Today's Vitals   09/26/21 1214  BP: (!) 170/83  Pulse: (!) 59  Weight: 143 lb (64.9 kg)  Height: $Remove'5\' 11"'[id number]$  (1.803 m)   Body mass index is 19.94 kg/m. Record notes 40 lb loss in 1 month, please verify wt at SNF General: frail appearing,  WNWD EYES: anicteric sclera, lids intact, no discharge  ENMT: intact hearing, oral mucous membranes moist, dentition intact CV: S1S2, RRR, no LE edema Pulmonary: rhonchi in bases, no increased work of breathing, occ  prod. Clear cough,96% on  2 l oxygen Abdomen: intake 50-75%, normo-active BS + 4 quadrants, soft and non tender, no ascites GU: deferred MSK: + sarcopenia, moves all extremities, ambulatory with walker and stand by Skin: warm and dry, no rashes or wounds on visible skin Neuro:  +  generalized weakness,  mild  cognitive impairment Psych: non-anxious affect, A and O x 3 Hem/lymph/immuno: no widespread bruising CURRENT PROBLEM LIST:  Patient Active Problem List   Diagnosis Date Noted   Blurred vision 09/13/2021   AKI (acute kidney injury) ([place]) 09/13/2021   Hypotension 09/13/2021   SSS (sick sinus syndrome) ([place]) 09/13/2021   H/O syncope 09/13/2021   Atrial fibrillation, chronic ([place]) 09/13/2021   Pressure injury of skin 09/13/2021   Hypoxia 09/12/2021   Sepsis with acute hypoxic respiratory failure and septic shock (HCC)    Type 2 diabetes mellitus with hyperglycemia ([place])    Shock ([place]) 08/18/2021   Anemia    Gross hematuria 09/10/2020   Hypertension    PAD (peripheral artery disease) (HCC)    Osteomyelitis, chronic, lower leg, left (HCC)    Hypothyroid    Lung nodule seen on imaging study    PAST MEDICAL HISTORY:  Active Ambulatory Problems    Diagnosis Date Noted   Gross hematuria 09/10/2020   Hypertension    PAD (peripheral artery disease) (HCC)    Osteomyelitis, chronic, lower leg, left (HCC)    Hypothyroid    Lung nodule seen on imaging study  Shock ([place]) 08/18/2021   Anemia    Sepsis with acute hypoxic respiratory failure and septic shock (HCC)    Type 2 diabetes mellitus with hyperglycemia ([place])    Hypoxia 09/12/2021   Blurred vision 09/13/2021   AKI (acute kidney injury) ([place]) 09/13/2021   Hypotension 09/13/2021   SSS (sick sinus syndrome) ([place]) 09/13/2021   H/O syncope 09/13/2021   Atrial fibrillation, chronic ([place]) 09/13/2021   Pressure injury of skin 09/13/2021   Resolved Ambulatory Problems    Diagnosis Date Noted   Diabetes mellitus without complication ([place])    Past Medical History:  Diagnosis Date   Atrial fibrillation ([place])    Hyperlipidemia    SOCIAL HX:  Social History   Tobacco Use   Smoking status: Former   Smokeless tobacco: Never  Substance Use Topics   Alcohol use: Never   FAMILY HX: No family history on  file.    ALLERGIES:  Allergies  Allergen Reactions   Statins Palpitations     PERTINENT MEDICATIONS:  Outpatient Encounter Medications as of 09/26/2021  Medication Sig   acetaminophen (TYLENOL) 500 MG tablet Take 500 mg by mouth every 8 (eight) hours as needed for mild pain.   amoxicillin-clavulanate (AUGMENTIN) 875-125 MG tablet Take 1 tablet by mouth 2 (two) times daily.   apixaban (ELIQUIS) 2.5 MG TABS tablet Take 1 tablet (2.5 mg total) by mouth 2 (two) times daily.   Cholecalciferol 25 MCG (1000 UT) tablet Take 1,000 mg by mouth daily.   cyanocobalamin 1000 MCG tablet Take 1,000 mcg by mouth daily.   doxycycline (DORYX) 100 MG EC tablet Take 100 mg by mouth 2 (two) times daily.   ezetimibe (ZETIA) 10 MG tablet Take 10 mg by mouth daily.   ferrous sulfate 325 (65 FE) MG tablet Take 1 tablet (325 mg total) by mouth daily with breakfast.   finasteride (PROSCAR) 5 MG tablet Take 5 mg by mouth daily.   furosemide (LASIX) 20 MG tablet Hold until outpatient followup due to blood pressure normal without it.   gabapentin (NEURONTIN) 300 MG capsule Take 300 mg by mouth daily.   levothyroxine (SYNTHROID) 125 MCG tablet Take 125 mcg by mouth daily before breakfast.   Multiple Vitamin (MULTIVITAMIN WITH MINERALS) TABS tablet Take 1 tablet by mouth daily.   pantoprazole (PROTONIX) 40 MG tablet Take 1 tablet (40 mg total) by mouth daily.   predniSONE (DELTASONE) 20 MG tablet Take 20 mg by mouth daily with breakfast.   psyllium (METAMUCIL SMOOTH TEXTURE) 58.6 % powder Take 1 packet by mouth every evening.   sitaGLIPtin (JANUVIA) 100 MG tablet Take 100 mg by mouth daily.   Skin Protectants, Misc. (CALAZIME SKIN PROTECTANT EX) Apply 1 application topically as needed (periwound area).   tamsulosin (FLOMAX) 0.4 MG CAPS capsule Take 0.4 mg by mouth at bedtime.   vitamin C (ASCORBIC ACID) 500 MG tablet Take 500 mg by mouth 2 (two) times daily.   zinc sulfate 220 (50 Zn) MG capsule Take 220 mg by mouth  daily.   diltiazem (TIAZAC) 180 MG 24 hr capsule Hold until outpatient followup due to normal blood pressure without it. (Patient not taking: Reported on 09/26/2021)   No facility-administered encounter medications on file as of 09/26/2021.     Thank you for the opportunity to participate in the care of [name] [name].  The palliative care team will continue to follow. Please call our office at [phone] if we can be of additional assistance.   Jason Coop, NP ,  DNP, AGPCNP-BC  COVID-19 PATIENT SCREENING TOOL Asked and negative response unless otherwise noted:  Have you had symptoms of covid, tested positive or been in contact with someone with symptoms/positive test in the past 5-10 days?

## 2021-10-18 ENCOUNTER — Other Ambulatory Visit: Payer: Self-pay

## 2021-10-18 ENCOUNTER — Non-Acute Institutional Stay: Payer: Medicare Other | Admitting: Primary Care

## 2021-10-18 DIAGNOSIS — I739 Peripheral vascular disease, unspecified: Secondary | ICD-10-CM

## 2021-10-18 DIAGNOSIS — L89610 Pressure ulcer of right heel, unstageable: Secondary | ICD-10-CM

## 2021-10-18 DIAGNOSIS — I482 Chronic atrial fibrillation, unspecified: Secondary | ICD-10-CM

## 2021-10-18 DIAGNOSIS — J189 Pneumonia, unspecified organism: Secondary | ICD-10-CM

## 2021-10-18 DIAGNOSIS — Z515 Encounter for palliative care: Secondary | ICD-10-CM

## 2021-10-18 DIAGNOSIS — R5381 Other malaise: Secondary | ICD-10-CM

## 2021-10-18 NOTE — Progress Notes (Signed)
Therapist, nutritional Palliative Care Consult Note Telephone: [phone]  Fax: [phone]    Date of encounter: 10/18/21 1:52 PM PATIENT NAME: [name] [name] [address] [place] [address]   [phone] (home)  DOB: [birth year] MRN: [id number] PRIMARY CARE PROVIDER:    Zenaida Niece, MD,  [address] [place], [place] [address] [phone]  REFERRING PROVIDER:   Rosetta Posner, MD Rosetta Posner, MD [address] [place],  [place] [address] [phone]   RESPONSIBLE PARTY:    Contact Information     Name Relation Home Work Mobile   Payne,Jennifer Daughter [phone]  [phone]   Celestino, Ackerman  [phone]   Kristin Bruins  [phone]        I met face to face with patient  in [facility] and later family conference by phone. Palliative Care was asked to follow this patient by consultation request of  Rosetta Posner, MD  to address advance care planning and complex medical decision making. This is a follow up visit.                                   ASSESSMENT AND PLAN / RECOMMENDATIONS:   Advance Care Planning/Goals of Care: Goals include to maximize quality of life and symptom management. Patient/health care surrogate gave his/her permission to discuss.Our advance care planning conversation included a discussion about:    The value and importance of advance care planning  Experiences with loved ones who have been seriously ill or have died  Exploration of personal, cultural or spiritual beliefs that might influence medical decisions  Exploration of goals of care in the event of a sudden injury or illness  Identification of a healthcare agent - son and daughter Review  of an  advance directive document . Decision to de-escalate disease focused treatments due to poor prognosis. CODE STATUS: DNR Advance care planning discussion with son and daughter. His wounds were precluding him from returning to his assisted  living where he wants to return, as this is his home. We discussed his decline and eligibility for hospice services; hospice can manage his wound at the assisted facility. Discussed goals of care and they discuss de escalation and comfort focus.   Son and daughter would like him to be able to return to his home and have the supportive care of hospice in place.  His eligibility has been approved by the hospice medical Director and I have apprised facility social worker of family's request. I am happy to facilitate between any parties to have him return to his facility and receive hospice services. Pt affect was very positive and upbeat at the prospect of returning to his assisted living and family wants to support this desire .  I spent 80 minutes providing this consultation. More than 50% of the time in this consultation was spent in counseling and care coordination. -----------------------------------------------------------------------------------------------------------  Symptom Management/Plan:  I met with patient in his nursing home room. I later had extensive goals of care discussion with his son and daughter by phone. I also consulted with Dr. Carma Lair, MD, wound facility physician.    Pain control: acetaminophen CR 650 mg po q 8 hrs recommended and I will order, and I recommend tramadol 25 mg qid prn,  if desired per SNF NP. Pt endorses he does not like boots due to pain using them.   Mood: Discussed Rx depression with daughter.  I will initiate zoloft 25 mg po q hs po.  Wounds: I have assessed wounds and discussed with MD the wound specialist's plan of care. Currently this plan  is to leave left heel eschar in place and maintain. Dr Legrand Como recommends offloading heel pressure with boots or proper use of bed elevator pillow. Pt is currently NOT using offloading devices correctly. He has good left leg and sacral wound healing although currently there were no apparent ointments or  dressings in place on L LE or sacrum.Wound healing /sacrum: Red area measuring 8 cm x 6 cm with area of breakdown 2 mm x 6 mm at 2 o'clock, healing stage 2. R Eschar unchangeable necrosis , 3.5 cm. x 1.7 cm; L stage 2, almost all healed, 4 mm round x 2.  I would recommend triamcinolone cream 0.1% bilaterally to lower extremities twice daily for dermatitis.   Nutrition:  Patient endorses fair appetite with early satiety. He has lost 12% of his weight since September. His most recent albumin on file was 2.2;  this was a month ago. He is receiving pro stat b.i.d. and I am requesting increase the Q ID as well as weekly weights. We also discussed glucerna for extra calories, which I've requested of nursing home staff.    Follow up Palliative Care Visit: Palliative care will continue to follow for complex medical decision making, advance care planning, and clarification of goals. Return 2 weeks or prn if not admitted to hospice.  This visit was coded based on medical decision making (MDM).  PPS: 30%  HOSPICE ELIGIBILITY/DIAGNOSIS: yes/protein calorie malnutrition  Chief Complaint: wounds, debility, immobility  HISTORY OF PRESENT ILLNESS:  [name] [name] is a 88 y.o. year old male  with several recent infections (PNA, covid), multiple wounds from pressure injury, cognitive impairment, immobility. He presents today with wounds that are slowly healing, and decreased intake and protein malnutrition .   History obtained from review of EMR, discussion with primary team, and interview with family, facility staff/caregiver and/or Mr. Cerone.  I reviewed available labs, medications, imaging, studies and related documents from the EMR.  Records reviewed and summarized above.   ROS   General: NAD EYES: denies vision changes ENMT: denies dysphagia Cardiovascular: denies chest pain, denies DOE Pulmonary: denies cough, denies increased SOB Abdomen: endorses fair appetite, denies constipation, endorses  continence of bowel GU: denies dysuria, endorses incontinence of urine MSK:  denies  increased weakness,  no falls reported Skin: denies rashes or wounds Neurological: endorses L heel  pain, denies insomnia Psych: Endorses positive mood Heme/lymph/immuno: denies bruises, abnormal bleeding  Physical Exam: Current and past weights: 142 lbs, 164 lbs in 9/22, 12 % loss Constitutional: NAD General: frail appearing, thin EYES: anicteric sclera, lids intact, no discharge  ENMT: intact hearing, oral mucous membranes moist, dentition intact CV: S1S2, RRR, no LE edema Pulmonary: LCTA, no increased work of breathing, no cough, oxygen 2 L , PO2 =96% Abdomen: intake 50%, no ascites GU: deferred MSK: + sarcopenia, moves all extremities, ambulatory with stand by and walker Skin: warm and dry,  wounds noted in assessment above Neuro:  +generalized weakness,  moderate cognitive impairment Psych: non-anxious affect, A and O x 3 Hem/lymph/immuno: no widespread bruising  Thank you for the opportunity to participate in the care of Mr. Brea.  The palliative care team will continue to follow. Please call our office at [phone] if we can be of additional assistance.   Jason Coop, NP DNP, AGPCNP-BC  COVID-19 PATIENT SCREENING TOOL  Asked and negative response unless otherwise noted:   Have you had symptoms of covid, tested positive or been in contact with someone with symptoms/positive test in the past 5-10 days?

## 2021-10-27 ENCOUNTER — Non-Acute Institutional Stay: Payer: Medicare Other | Admitting: Primary Care

## 2021-10-27 ENCOUNTER — Other Ambulatory Visit: Payer: Self-pay

## 2021-12-28 NOTE — Progress Notes (Signed)
Out of facility at time of visit.

## 2022-04-24 ENCOUNTER — Other Ambulatory Visit: Payer: Medicare Other | Admitting: Primary Care

## 2022-04-24 DIAGNOSIS — E1165 Type 2 diabetes mellitus with hyperglycemia: Secondary | ICD-10-CM

## 2022-04-24 DIAGNOSIS — I739 Peripheral vascular disease, unspecified: Secondary | ICD-10-CM

## 2022-04-24 DIAGNOSIS — L89610 Pressure ulcer of right heel, unstageable: Secondary | ICD-10-CM

## 2022-04-24 DIAGNOSIS — I495 Sick sinus syndrome: Secondary | ICD-10-CM

## 2022-04-24 DIAGNOSIS — Z515 Encounter for palliative care: Secondary | ICD-10-CM

## 2022-04-24 DIAGNOSIS — R5381 Other malaise: Secondary | ICD-10-CM

## 2022-04-24 NOTE — Progress Notes (Signed)
[facility] Palliative Care Consult Note Telephone: [phone]  Fax: [phone]    Date of encounter: 04/24/22 10:47 AM PATIENT NAME: [name] [name] [id number]   [phone] (home)  DOB: Jun 27, 1928 MRN: [id number] PRIMARY CARE PROVIDER:    [facility] [address] [address] [phone]   REFERRING PROVIDER:   [facility] [address] [place] [address] [phone] [name] Tackett NP  RESPONSIBLE PARTY:    Contact Information     Name Relation Home Work Mobile   [name],[name] Daughter [phone]  [phone]   [name], [name]  [phone]   [place]  [phone]         I met face to face with patient and family in [facility] facility. Palliative Care was asked to follow this patient by consultation request of  Alver Fisher NP to address advance care planning and complex medical decision making. This is a follow up visit.                                   ASSESSMENT AND PLAN / RECOMMENDATIONS:   Advance Care Planning/Goals of Care: Goals include to maximize quality of life and symptom management. Patient/health care surrogate gave his/her permission to discuss.Our advance care planning conversation included a discussion about:    The value and importance of advance care planning  Experiences with loved ones who have been seriously ill or have died  Exploration of personal, cultural or spiritual beliefs that might influence medical decisions  Exploration of goals of care in the event of a sudden injury or illness  Identification of a healthcare agent - Daughter [name] [name] in town Review and updating of an  advance directive document . CODE STATUS: DNR  I completed a MOST form today. The patient and family outlined their wishes for the following treatment decisions:  Cardiopulmonary Resuscitation: Do Not  Attempt Resuscitation (DNR/No CPR)  Medical Interventions: Comfort Measures: Keep clean, warm, and dry. Use medication by any route, positioning, wound care, and other measures to relieve pain and suffering. Use oxygen, suction and manual treatment of airway obstruction as needed for comfort. Do not transfer to the hospital unless comfort needs cannot be met in current location.  Antibiotics: No antibiotics (use other measures to relieve symptoms)  IV Fluids: No IV fluids (provide other measures to ensure comfort)  Feeding Tube: No feeding tube   I spent 20 minutes providing this consultation. More than 50% of the time in this consultation was spent in counseling and care coordination. --------------------------------------------------------------------------------------------------------------------------- Symptom Management/Plan:  Returned to palliative  care after hospice admission x 6 months.    Wounds: Has bil heel wounds, now has more wounds, and is going to a pace maker battery surgery.Does have pain with heels at hs. Does not like heel cut out boots. Discussed referral to wound care management.  Daughter to call for appt, I am happy to send referral. Patient would benefit from wound management assessment and plan of care. He has not been assessed by wound management provider  for over 6 months. Currently has [facility] doing dressing changes.   Nutrition: States has trouble chewing. Has h/o aspiration pneumonia. Recommend mechanical soft diet. Eats a good breakfast, other meals not as much.. State weight 145 lbs. Recommend supplemental dietary drinks.  Endurance: Revoked hospice due to take  pacemaker surgery. He reports significant fatigue which he states he was told would worsen with out the pacemaker battery surgery. He endorses he enjoys going to outings, and to activities. He states his mind is still sharp and he enjoys conversations.  Follow up Palliative Care Visit:  Palliative care will continue to follow for complex medical decision making, advance care planning, and clarification of goals. Return 6-8 weeks or prn.  This visit was coded based on medical decision making (MDM).  PPS: 40%  HOSPICE ELIGIBILITY/DIAGNOSIS: no  Chief Complaint: wounds, debility  HISTORY OF PRESENT ILLNESS:  [name] [name] is a 88 y.o. year old male  with debility, bil heel wounds, DM, SSS . Patient seen today to review palliative care needs to include medical decision making and advance care planning as appropriate.   History obtained from review of EMR, discussion with primary team, and interview with family, facility staff/caregiver and/or [name] [name] (88 years old).  I reviewed available labs, medications, imaging, studies and related documents from the EMR.  Records reviewed and summarized above.   ROS  General: NAD ENMT: denies dysphagia Cardiovascular: denies chest pain, endorses  DOE, for pacemaker battery replacement Pulmonary: denies cough, denies increased SOB Abdomen: endorses good appetite, denies constipation, endorses continence of bowel GU: denies dysuria, endorses continence of urine MSK:  denies  increased weakness, no falls reported Skin: denies rashes or wounds Neurological: denies pain, denies insomnia Psych: Endorses positive mood  Physical Exam: Current and past weights: 143 lbs Constitutional: NAD General: frail appearing, thin  EYES: anicteric sclera, lids intact, no discharge  ENMT: intact hearing, oral mucous membranes moist, dentition intact CV: slight  LE edema Pulmonary: no increased work of breathing, no cough, room air Abdomen: intake 70%, no ascites MSK: + sarcopenia, moves all extremities, ambulatory Skin: warm and dry, no rashes or wounds on visible skin Neuro:  + generalized weakness,  no cognitive impairment, non-anxious affect  Thank you for the opportunity to participate in the care of [name] [name].  The palliative care team will continue to  follow. Please call our office at [phone] if we can be of additional assistance.   [name] Coop, NP DNP, AGPCNP-BC  COVID-19 PATIENT SCREENING TOOL Asked and negative response unless otherwise noted:   Have you had symptoms of covid, tested positive or been in contact with someone with symptoms/positive test in the past 5-10 days?

## 2022-05-08 ENCOUNTER — Other Ambulatory Visit: Payer: Self-pay

## 2022-05-08 ENCOUNTER — Emergency Department: Payer: Medicare Other

## 2022-05-08 ENCOUNTER — Encounter: Payer: Self-pay | Admitting: Internal Medicine

## 2022-05-08 ENCOUNTER — Observation Stay
Admission: EM | Admit: 2022-05-08 | Discharge: 2022-05-09 | Disposition: A | Discharge status: Home Health | Payer: Medicare Other | Attending: Internal Medicine | Admitting: Internal Medicine

## 2022-05-08 DIAGNOSIS — R2681 Unsteadiness on feet: Secondary | ICD-10-CM | POA: Insufficient documentation

## 2022-05-08 DIAGNOSIS — I10 Essential (primary) hypertension: Secondary | ICD-10-CM | POA: Diagnosis not present

## 2022-05-08 DIAGNOSIS — Z7901 Long term (current) use of anticoagulants: Secondary | ICD-10-CM | POA: Insufficient documentation

## 2022-05-08 DIAGNOSIS — D649 Anemia, unspecified: Secondary | ICD-10-CM

## 2022-05-08 DIAGNOSIS — L97419 Non-pressure chronic ulcer of right heel and midfoot with unspecified severity: Secondary | ICD-10-CM | POA: Diagnosis not present

## 2022-05-08 DIAGNOSIS — Z79899 Other long term (current) drug therapy: Secondary | ICD-10-CM | POA: Diagnosis not present

## 2022-05-08 DIAGNOSIS — E785 Hyperlipidemia, unspecified: Secondary | ICD-10-CM

## 2022-05-08 DIAGNOSIS — D62 Acute posthemorrhagic anemia: Secondary | ICD-10-CM | POA: Diagnosis not present

## 2022-05-08 DIAGNOSIS — M6281 Muscle weakness (generalized): Secondary | ICD-10-CM | POA: Diagnosis not present

## 2022-05-08 DIAGNOSIS — J90 Pleural effusion, not elsewhere classified: Secondary | ICD-10-CM | POA: Insufficient documentation

## 2022-05-08 DIAGNOSIS — Z7984 Long term (current) use of oral hypoglycemic drugs: Secondary | ICD-10-CM | POA: Diagnosis not present

## 2022-05-08 DIAGNOSIS — Z87891 Personal history of nicotine dependence: Secondary | ICD-10-CM | POA: Diagnosis not present

## 2022-05-08 DIAGNOSIS — R262 Difficulty in walking, not elsewhere classified: Secondary | ICD-10-CM | POA: Insufficient documentation

## 2022-05-08 DIAGNOSIS — I4891 Unspecified atrial fibrillation: Secondary | ICD-10-CM | POA: Insufficient documentation

## 2022-05-08 DIAGNOSIS — R531 Weakness: Secondary | ICD-10-CM

## 2022-05-08 DIAGNOSIS — E11621 Type 2 diabetes mellitus with foot ulcer: Secondary | ICD-10-CM | POA: Insufficient documentation

## 2022-05-08 DIAGNOSIS — N179 Acute kidney failure, unspecified: Secondary | ICD-10-CM | POA: Diagnosis not present

## 2022-05-08 DIAGNOSIS — E875 Hyperkalemia: Secondary | ICD-10-CM | POA: Diagnosis not present

## 2022-05-08 DIAGNOSIS — Z950 Presence of cardiac pacemaker: Secondary | ICD-10-CM | POA: Insufficient documentation

## 2022-05-08 DIAGNOSIS — D509 Iron deficiency anemia, unspecified: Secondary | ICD-10-CM

## 2022-05-08 DIAGNOSIS — E039 Hypothyroidism, unspecified: Secondary | ICD-10-CM | POA: Diagnosis not present

## 2022-05-08 DIAGNOSIS — R8281 Pyuria: Secondary | ICD-10-CM | POA: Insufficient documentation

## 2022-05-08 DIAGNOSIS — D500 Iron deficiency anemia secondary to blood loss (chronic): Principal | ICD-10-CM | POA: Insufficient documentation

## 2022-05-08 DIAGNOSIS — E119 Type 2 diabetes mellitus without complications: Secondary | ICD-10-CM

## 2022-05-08 DIAGNOSIS — N400 Enlarged prostate without lower urinary tract symptoms: Secondary | ICD-10-CM

## 2022-05-08 HISTORY — DX: Iron deficiency anemia, unspecified: D50.9

## 2022-05-08 HISTORY — DX: Benign prostatic hyperplasia without lower urinary tract symptoms: N40.0

## 2022-05-08 HISTORY — DX: Hypothyroidism, unspecified: E03.9

## 2022-05-08 LAB — CBC WITH DIFFERENTIAL/PLATELET
Abs Immature Granulocytes: 0.07 10*3/uL (ref 0.00–0.07)
Basophils Absolute: 0 10*3/uL (ref 0.0–0.1)
Basophils Relative: 0 %
Eosinophils Absolute: 0.1 10*3/uL (ref 0.0–0.5)
Eosinophils Relative: 1 %
HCT: 23.5 % — ABNORMAL LOW (ref 39.0–52.0)
Hemoglobin: 6.9 g/dL — ABNORMAL LOW (ref 13.0–17.0)
Immature Granulocytes: 1 %
Lymphocytes Relative: 5 %
Lymphs Abs: 0.6 10*3/uL — ABNORMAL LOW (ref 0.7–4.0)
MCH: 21.4 pg — ABNORMAL LOW (ref 26.0–34.0)
MCHC: 29.4 g/dL — ABNORMAL LOW (ref 30.0–36.0)
MCV: 73 fL — ABNORMAL LOW (ref 80.0–100.0)
Monocytes Absolute: 0.9 10*3/uL (ref 0.1–1.0)
Monocytes Relative: 8 %
Neutro Abs: 9.1 10*3/uL — ABNORMAL HIGH (ref 1.7–7.7)
Neutrophils Relative %: 85 %
Platelets: 383 10*3/uL (ref 150–400)
RBC: 3.22 MIL/uL — ABNORMAL LOW (ref 4.22–5.81)
RDW: 17.6 % — ABNORMAL HIGH (ref 11.5–15.5)
WBC: 10.7 10*3/uL — ABNORMAL HIGH (ref 4.0–10.5)
nRBC: 0 % (ref 0.0–0.2)

## 2022-05-08 LAB — PROTIME-INR
INR: 1.4 — ABNORMAL HIGH (ref 0.8–1.2)
INR: 1.4 — ABNORMAL HIGH (ref 0.8–1.2)
Prothrombin Time: 17.5 seconds — ABNORMAL HIGH (ref 11.4–15.2)
Prothrombin Time: 17.4 seconds — ABNORMAL HIGH (ref 11.4–15.2)

## 2022-05-08 LAB — COMPREHENSIVE METABOLIC PANEL
ALT: 14 U/L (ref 0–44)
AST: 16 U/L (ref 15–41)
Albumin: 2.6 g/dL — ABNORMAL LOW (ref 3.5–5.0)
Alkaline Phosphatase: 71 U/L (ref 38–126)
Anion gap: 6 (ref 5–15)
BUN: 31 mg/dL — ABNORMAL HIGH (ref 8–23)
CO2: 24 mmol/L (ref 22–32)
Calcium: 8.9 mg/dL (ref 8.9–10.3)
Chloride: 106 mmol/L (ref 98–111)
Creatinine, Ser: 1.08 mg/dL (ref 0.61–1.24)
GFR, Estimated: >60 mL/min (ref >60)
Glucose, Bld: 242 mg/dL — ABNORMAL HIGH (ref 70–99)
Potassium: 5.4 mmol/L — ABNORMAL HIGH (ref 3.5–5.1)
Sodium: 136 mmol/L (ref 135–145)
Total Bilirubin: 0.5 mg/dL (ref 0.3–1.2)
Total Protein: 7.8 g/dL (ref 6.5–8.1)

## 2022-05-08 LAB — PREPARE RBC (CROSSMATCH)

## 2022-05-08 LAB — RETICULOCYTES
Immature Retic Fract: 18.2 % — ABNORMAL HIGH (ref 2.3–15.9)
RBC.: 3.25 MIL/uL — ABNORMAL LOW (ref 4.22–5.81)
Retic Count, Absolute: 37.7 10*3/uL (ref 19.0–186.0)
Retic Ct Pct: 1.2 % (ref 0.4–3.1)

## 2022-05-08 LAB — IRON AND TIBC
Iron: 11 ug/dL — ABNORMAL LOW (ref 45–182)
Saturation Ratios: 4 % — ABNORMAL LOW (ref 17.9–39.5)
TIBC: 263 ug/dL (ref 250–450)
UIBC: 252 ug/dL

## 2022-05-08 LAB — TSH: TSH: 0.24 u[IU]/mL — ABNORMAL LOW (ref 0.350–4.500)

## 2022-05-08 LAB — VITAMIN B12: Vitamin B-12: 1135 pg/mL — ABNORMAL HIGH (ref 180–914)

## 2022-05-08 LAB — FOLATE: Folate: >40 ng/mL (ref >5.9)

## 2022-05-08 LAB — FERRITIN: Ferritin: 64 ng/mL (ref 24–336)

## 2022-05-08 LAB — LACTIC ACID, PLASMA
Lactic Acid, Venous: 1.7 mmol/L (ref 0.5–1.9)
Lactic Acid, Venous: 0.9 mmol/L (ref 0.5–1.9)

## 2022-05-08 MED ORDER — FINASTERIDE 5 MG PO TABS
5.0000 mg | ORAL_TABLET | Freq: Every day | ORAL | Status: DC
  Administered 2022-05-09: 5 mg via ORAL
  Filled 2022-05-08: qty 1

## 2022-05-08 MED ORDER — PANTOPRAZOLE SODIUM 40 MG PO TBEC
40.0000 mg | DELAYED_RELEASE_TABLET | Freq: Every day | ORAL | Status: DC
  Administered 2022-05-09: 40 mg via ORAL
  Filled 2022-05-08: qty 1

## 2022-05-08 MED ORDER — ONDANSETRON HCL 4 MG PO TABS: 4.0000 mg | ORAL_TABLET | Freq: Four times a day (QID) | ORAL | Status: DC | PRN

## 2022-05-08 MED ORDER — SODIUM CHLORIDE 0.9% FLUSH
3.0000 mL | Freq: Two times a day (BID) | INTRAVENOUS | Status: DC
  Administered 2022-05-08 – 2022-05-09 (×2): 3 mL via INTRAVENOUS

## 2022-05-08 MED ORDER — SODIUM CHLORIDE 0.9 % IV SOLN: INTRAVENOUS | Status: AC

## 2022-05-08 MED ORDER — ADULT MULTIVITAMIN W/MINERALS CH
1.0000 | ORAL_TABLET | Freq: Every day | ORAL | Status: DC
  Administered 2022-05-08 – 2022-05-09 (×2): 1 via ORAL
  Filled 2022-05-08 (×2): qty 1

## 2022-05-08 MED ORDER — ONDANSETRON HCL 4 MG/2ML IJ SOLN: 4.0000 mg | Freq: Four times a day (QID) | INTRAMUSCULAR | Status: DC | PRN

## 2022-05-08 MED ORDER — ASCORBIC ACID 500 MG PO TABS
500.0000 mg | ORAL_TABLET | Freq: Two times a day (BID) | ORAL | Status: DC
  Administered 2022-05-08 – 2022-05-09 (×2): 500 mg via ORAL
  Filled 2022-05-08 (×2): qty 1

## 2022-05-08 MED ORDER — EZETIMIBE 10 MG PO TABS
10.0000 mg | ORAL_TABLET | Freq: Every day | ORAL | Status: DC
Start: 2022-05-08 — End: 2022-05-08

## 2022-05-08 MED ORDER — OXYCODONE HCL 5 MG PO TABS: 5.0000 mg | ORAL_TABLET | ORAL | Status: DC | PRN

## 2022-05-08 MED ORDER — VITAMIN D 25 MCG (1000 UNIT) PO TABS
1000.0000 [IU] | ORAL_TABLET | Freq: Every day | ORAL | Status: DC
  Administered 2022-05-09: 1000 [IU] via ORAL
  Filled 2022-05-08: qty 1

## 2022-05-08 MED ORDER — METOPROLOL TARTRATE 5 MG/5ML IV SOLN: 5.0000 mg | Freq: Four times a day (QID) | INTRAVENOUS | Status: DC | PRN

## 2022-05-08 MED ORDER — ACETAMINOPHEN 500 MG PO TABS
500.0000 mg | ORAL_TABLET | Freq: Three times a day (TID) | ORAL | Status: DC | PRN
Start: 2022-05-08 — End: 2022-05-09

## 2022-05-08 MED ORDER — TAMSULOSIN HCL 0.4 MG PO CAPS
0.4000 mg | ORAL_CAPSULE | Freq: Every day | ORAL | Status: DC
  Administered 2022-05-08: 0.4 mg via ORAL
  Filled 2022-05-08: qty 1

## 2022-05-08 MED ORDER — PANTOPRAZOLE SODIUM 40 MG PO TBEC
40.0000 mg | DELAYED_RELEASE_TABLET | Freq: Every day | ORAL | Status: DC
Start: 2022-05-08 — End: 2022-05-08

## 2022-05-08 MED ORDER — VANCOMYCIN HCL 1500 MG/300ML IV SOLN
1500.0000 mg | Freq: Once | INTRAVENOUS | Status: AC
  Administered 2022-05-08: 1500 mg via INTRAVENOUS
  Filled 2022-05-08: qty 300

## 2022-05-08 MED ORDER — SODIUM CHLORIDE 0.9 % IV SOLN
2.0000 g | Freq: Once | INTRAVENOUS | Status: AC
  Administered 2022-05-08: 2 g via INTRAVENOUS
  Filled 2022-05-08: qty 12.5

## 2022-05-08 MED ORDER — SERTRALINE HCL 50 MG PO TABS
25.0000 mg | ORAL_TABLET | Freq: Every day | ORAL | Status: DC
  Administered 2022-05-08: 25 mg via ORAL
  Filled 2022-05-08: qty 1

## 2022-05-08 MED ORDER — EZETIMIBE 10 MG PO TABS
10.0000 mg | ORAL_TABLET | Freq: Every day | ORAL | Status: DC
  Administered 2022-05-09: 10 mg via ORAL
  Filled 2022-05-08: qty 1

## 2022-05-08 MED ORDER — ZINC SULFATE 220 (50 ZN) MG PO CAPS
220.0000 mg | ORAL_CAPSULE | Freq: Every day | ORAL | Status: DC
  Administered 2022-05-08 – 2022-05-09 (×2): 220 mg via ORAL
  Filled 2022-05-08 (×2): qty 1

## 2022-05-08 MED ORDER — FINASTERIDE 5 MG PO TABS
5.0000 mg | ORAL_TABLET | Freq: Every day | ORAL | Status: DC
Start: 2022-05-08 — End: 2022-05-08

## 2022-05-08 MED ORDER — INSULIN ASPART 100 UNIT/ML IJ SOLN
0.0000 [IU] | Freq: Three times a day (TID) | INTRAMUSCULAR | Status: DC
  Administered 2022-05-09: 1 [IU] via SUBCUTANEOUS
  Filled 2022-05-08: qty 1

## 2022-05-08 MED ORDER — SODIUM CHLORIDE 0.9 % IV SOLN
10.0000 mL/h | Freq: Once | INTRAVENOUS | Status: AC
  Administered 2022-05-08: 10 mL/h via INTRAVENOUS

## 2022-05-08 MED ORDER — LEVOTHYROXINE SODIUM 50 MCG PO TABS
125.0000 ug | ORAL_TABLET | Freq: Every day | ORAL | Status: DC
  Administered 2022-05-09: 125 ug via ORAL
  Filled 2022-05-08: qty 3

## 2022-05-08 NOTE — H&P (Signed)
History and Physical    [name] [name] VVO:[id number] DOB: 1928/07/16 DOA: 05/08/2022  PCP: Smiley Houseman, NP Patient coming from: ALF Chief Complaint: Weakness  HPI: [name] [name] is a 88 y.o. male with medical history significant of Atrial fibrillation on eliquis, DM2. HLD, HTN, BPH, GERD, iron deficiency anemia, hypothyroidism, hard of hearing who presents for weakness.  Per Mr. Heindel, he has been feeling weaker for the past week with low energy and generalized weakness of his legs. No falls, no lightheadedness, no SOB, no chest pain.  He has chronic heal ulcers which are being treated with wound care.  There was report of a WBC of 30 at his ALF, however, on repeat here his WBC was 10.7.  His Hgb was noted to be 6.9, however.  Iron level today is 11, Saturation is 4, ferritin 64. Per chart review, it appears he has a chronic Hgb around 8.  He had a drop in Hgb to 5.6 in December of 2022.  EDP discussed case with family member (daughter) and patient would consent to blood transfusion, but does not desire a long hospitalization.  He was a previous hospice patient, but revoked this status for pacemaker batter change on 04/25/22.    ED Course: In the ED, He was found to have an elevated K at 5.4, Cr of 1.08 with BUN of 31.  Albumin of 2.6.  Iron studies as above.  Hgb 6.9.  Heel wounds noted.   Review of Systems: As per HPI otherwise all other systems reviewed and are negative.  Past Medical History:  Diagnosis Date   Atrial fibrillation (HCC)    BPH (benign prostatic hyperplasia)    Diabetes mellitus without complication (HCC)    Hyperlipidemia    Hypertension    Hypothyroidism    Iron deficiency anemia     Past Surgical History:  Procedure Laterality Date   APPENDECTOMY     CHOLECYSTECTOMY     PACEMAKER PLACEMENT     TONSILLECTOMY      Social History  reports that he has quit smoking. He has never used smokeless tobacco. He reports that he does not drink alcohol and does not use  drugs.  Allergies  Allergen Reactions   Statins Palpitations    Family History  Problem Relation Age of Onset   Diabetes Mellitus II Mother    Hyperlipidemia Mother    Heart failure Mother    Cancer - Colon Father    Heart failure Father     Prior to Admission medications   Medication Sig Start Date End Date Taking? Authorizing Provider  acetaminophen (TYLENOL) 500 MG tablet Take 500 mg by mouth every 8 (eight) hours as needed for mild pain. 06/14/21   [provider]  acetaminophen (TYLENOL) 650 MG CR tablet Take 650 mg by mouth every 8 (eight) hours.    [provider]  apixaban (ELIQUIS) 2.5 MG TABS tablet Take 1 tablet (2.5 mg total) by mouth 2 (two) times daily. 09/15/21   Charise Killian, MD  Cholecalciferol 25 MCG (1000 UT) tablet Take 1,000 mg by mouth daily. 08/24/20   [provider]  cyanocobalamin 1000 MCG tablet Take 1,000 mcg by mouth daily.    [provider]  diltiazem (TIAZAC) 180 MG 24 hr capsule Hold until outpatient followup due to normal blood pressure without it. Patient not taking: Reported on 09/26/2021 08/24/21   Darlin Priestly, MD  ezetimibe (ZETIA) 10 MG tablet Take 10 mg by mouth daily. 08/09/20   [provider]  ferrous sulfate 325 (65 FE) MG tablet Take 1 tablet (325 mg total) by mouth daily with breakfast. 08/25/21   Darlin Priestly, MD  finasteride (PROSCAR) 5 MG tablet Take 5 mg by mouth daily. 08/24/20   [provider]  furosemide (LASIX) 20 MG tablet Hold until outpatient followup due to blood pressure normal without it. 08/24/21   Darlin Priestly, MD  gabapentin (NEURONTIN) 300 MG capsule Take 300 mg by mouth daily.    [provider]  levothyroxine (SYNTHROID) 125 MCG tablet Take 125 mcg by mouth daily before breakfast.    [provider]  Multiple Vitamin (MULTIVITAMIN WITH MINERALS) TABS tablet Take 1 tablet by mouth daily.    [provider]  pantoprazole (PROTONIX) 40 MG tablet Take  1 tablet (40 mg total) by mouth daily. 08/25/21   Darlin Priestly, MD  psyllium (METAMUCIL SMOOTH TEXTURE) 58.6 % powder Take 1 packet by mouth every evening.    [provider]  sertraline (ZOLOFT) 25 MG tablet Take 25 mg by mouth at bedtime. X 2 weeks then increase to 50 mg nightlty 10/19/21   [provider]  sitaGLIPtin (JANUVIA) 100 MG tablet Take 100 mg by mouth daily.    [provider]  Skin Protectants, Misc. (CALAZIME SKIN PROTECTANT EX) Apply 1 application topically as needed (periwound area).    [provider]  tamsulosin (FLOMAX) 0.4 MG CAPS capsule Take 0.4 mg by mouth at bedtime. 08/09/20   [provider]  vitamin C (ASCORBIC ACID) 500 MG tablet Take 500 mg by mouth 2 (two) times daily.    [provider]  zinc sulfate 220 (50 Zn) MG capsule Take 220 mg by mouth daily.    [provider]    Physical Exam: Vitals:   05/08/22 1326 05/08/22 1330 05/08/22 1802  BP: (!) 109/49  (!) 164/57  Pulse: 87  60  Resp: 18  16  Temp: 98.2 F (36.8 C)  98.2 F (36.8 C)  TempSrc: Oral    SpO2: 96%  100%  Weight:  64.9 kg   Height:  5\' 11"  (1.803 m)     Constitutional: NAD, calm, comfortable Eyes: Mild conjunctival pallor ENMT: Mucous membranes are moist.  Neck: normal, supple Respiratory: Decreased breath sounds at bases, no wheezing Cardiovascular: RR, NR, no murmur, + mild pedal edema Abdomen: NT, ND, +BS Musculoskeletal: decreased bulk due to age, normal tone.  He has bandages over the lower legs/ankles bilaterally Skin: He has chronic skin changes of the legs, dry skin of the legs, wounds on ankles are covered with new bandages which he requested not to be taken down.  Neurologic: Very hard of hearing, grossly intact, moving all extremities without issue.  Speech is clear.  Psychiatric: Normal judgment and insight. Alert and oriented. Normal mood.    Labs on Admission: I have personally reviewed following labs and imaging  studies  CBC: Recent Labs  Lab 05/08/22 1343  WBC 10.7*  NEUTROABS 9.1*  HGB 6.9*  HCT 23.5*  MCV 73.0*  PLT 383    Basic Metabolic Panel: Recent Labs  Lab 05/08/22 1343  NA 136  K 5.4*  CL 106  CO2 24  GLUCOSE 242*  BUN 31*  CREATININE 1.08  CALCIUM 8.9    GFR: Estimated Creatinine Clearance: 40.9 mL/min (by C-G formula based on SCr of 1.08 mg/dL).  Liver Function Tests: Recent Labs  Lab 05/08/22 1343  AST 16  ALT 14  ALKPHOS 71  BILITOT 0.5  PROT 7.8  ALBUMIN 2.6*      Radiological Exams on Admission: DG Foot Complete Right  Result Date: 05/08/2022 CLINICAL DATA:  RIGHT heel wound EXAM: RIGHT FOOT COMPLETE - 3+ VIEW COMPARISON:  None FINDINGS: Marked osseous demineralization. Mild degenerative changes first MTP joint. Remaining joint spaces preserved. Soft tissue swelling RIGHT foot. Extensive small vessel vascular calcifications. No acute fracture, dislocation, or bone destruction. IMPRESSION: No acute osseous abnormalities. Electronically Signed   By: Ulyses Southward M.D.   On: 05/08/2022 18:49   DG Chest Portable 1 View  Result Date: 05/08/2022 CLINICAL DATA:  Weakness.  Abnormal labs. EXAM: PORTABLE CHEST 1 VIEW COMPARISON:  Chest radiograph 09/12/2021 FINDINGS: Right pleural effusion and basilar opacity, increased from prior exam. No definite left pleural effusion on this portable AP view. Dual lead left-sided pacemaker in place. Cardiomegaly is stable. Unchanged mediastinal contours. No pulmonary edema. No pneumothorax. IMPRESSION: Increasing right pleural effusion and basilar opacity, atelectasis versus pneumonia. Electronically Signed   By: Narda Rutherford M.D.   On: 05/08/2022 17:50    EKG: Independently reviewed. AV paced  Assessment/Plan  Acute on chronic blood loss anemia Iron deficiency anemia - Patient with long term iron deficiency anemia, likely multifactorial with chronic GI loss while on eliquis and nutritional deficits with low MCV - PRBC  X 1 - Repeat H/H after - Retic count elevated as expected - No extensive work up desired by patient or family - Monitor on telemetry  Mild AKI Mild Hyperkalemia - Start IVF at 50cc/hr for 6 hours - Trend  Pleural effusion on imaging, no SOB or hypoxia - On spironolactone at home - Held today given hyperkalemia - Monitor for SOB, hypoxia  Atrial fibrillation - Hold eliquis tonight until after receives blood - Restart on discharge if desired  Chronic heel wounds Possible overlying cellulitis - I was not able to see the wounds, patient asked for them to not be unwrapped - Wound care consult - Transition to oral Abx, augmentin or doxycycline for short course on discharge  Diabetes mellitus without complication (HCC) - Hold januvia - SSI while in hospital    Hypothyroidism - Continue home synthroid - Check TSH    BPH (benign prostatic hyperplasia) - Continue home finasteride and tamsulosin - Monitor for urinary retention    Hyperlipidemia - Continue home zetia     DVT prophylaxis: SCDs  Code Status:   DNR (DNR form on file)  Family Communication:  Daughter  Disposition Plan:   Patient is from:  ALF  Anticipated DC to:  ALF with hospice?  Anticipated DC date:  05/09/22  Anticipated DC barriers: None  Consults called:  Wound Care  Admission status:  Telemetry, obs   Severity of Illness: The appropriate patient status for this patient is OBSERVATION. Observation status is judged to be reasonable and necessary in order to provide the required intensity of service to ensure the patient's safety. The patient's presenting symptoms, physical exam findings, and initial radiographic and laboratory data in the context of their medical condition is felt to place them at decreased risk for further clinical deterioration. Furthermore, it is anticipated that the patient will be medically stable for discharge from the hospital within 2 midnights of admission.     Debe Coder  MD [facility]  How to contact the [facility] Attending or Consulting provider 7A - 7P or covering provider during after hours 7P -7A, for this patient?   Check the care team in [facility] and look for a) attending/consulting [facility]  provider listed and b) the [facility] team listed Log into [email or web address] and use [facility] universal password to access. If you do not have the password, please contact the hospital operator. Locate the [facility] provider you are looking for under [facility] and page to a number that you can be directly reached. If you still have difficulty reaching the provider, please page the [facility] (Director on Call) for the Hospitalists listed on amion for assistance.  05/08/2022, 8:06 PM

## 2022-05-08 NOTE — ED Provider Notes (Signed)
[facility] Provider Note    Event Date/Time   First MD Initiated Contact with Patient 05/08/22 1517     (approximate)   History   Abnormal Labs   HPI  [name] [name] is a 88 y.o. male with past medical history of hypertension, hyperlipidemia, diabetes, atrial fibrillation on anticoagulation, here with generalized weakness.  Patient states that over the last several days to week, he has felt generally weak and tired.  He denies specific triggers.  Denies any significant increased pain.  He has had no fevers or chills.  States his appetite has been normal.  He states that he had lab work drawn at his assisted living facility today, and was told to come in because his white count was high and his blood level is low.  He does note that he has had some intermittent black stool.  Denies any hematochezia.  No maroon-colored stool.  No abdominal pain.  He has chronic wounds of his bilateral ankles, states they are not significantly worse from baseline and he has had someone coming into his house daily to address these.     Physical Exam   Triage Vital Signs: ED Triage Vitals  Enc Vitals Group     BP 05/08/22 1326 (!) 109/49     Pulse Rate 05/08/22 1326 87     Resp 05/08/22 1326 18     Temp 05/08/22 1326 98.2 F (36.8 C)     Temp Source 05/08/22 1326 Oral     SpO2 05/08/22 1326 96 %     Weight 05/08/22 1330 143 lb 1.3 oz (64.9 kg)     Height 05/08/22 1330 5\' 11"  (1.803 m)     Head Circumference --      Peak Flow --      Pain Score 05/08/22 1330 0     Pain Loc --      Pain Edu? --      Excl. in GC? --     Most recent vital signs: Vitals:   05/08/22 1326 05/08/22 1802  BP: (!) 109/49 (!) 164/57  Pulse: 87 60  Resp: 18 16  Temp: 98.2 F (36.8 C) 98.2 F (36.8 C)  SpO2: 96% 100%     General: Awake, no distress.  CV:  Good peripheral perfusion.  Regular rate and rhythm. Resp:  Normal effort.  Lungs clear. Abd:  No distention.  No tenderness.  No  rebound or guarding. Other:  Overall, appears pale, tired.  No focal deficits.  Cranial nerves II through XII intact.  Strength out of 5 bilateral upper and lower extremity.  Normal sensation to light touch.  Left foot with small, dry pressure ulcer along the heel.  Right heel with larger, slightly purulent ulcer with some mild surrounding erythema throughout the foot.  No crepitance.   ED Results / Procedures / Treatments   Labs (all labs ordered are listed, but only abnormal results are displayed) Labs Reviewed  COMPREHENSIVE METABOLIC PANEL - Abnormal; Notable for the following components:      Result Value   Potassium 5.4 (*)    Glucose, Bld 242 (*)    BUN 31 (*)    Albumin 2.6 (*)    All other components within normal limits  CBC WITH DIFFERENTIAL/PLATELET - Abnormal; Notable for the following components:   WBC 10.7 (*)    RBC 3.22 (*)    Hemoglobin 6.9 (*)    HCT 23.5 (*)    MCV 73.0 (*)  MCH 21.4 (*)    MCHC 29.4 (*)    RDW 17.6 (*)    Neutro Abs 9.1 (*)    Lymphs Abs 0.6 (*)    All other components within normal limits  PROTIME-INR - Abnormal; Notable for the following components:   Prothrombin Time 17.5 (*)    INR 1.4 (*)    All other components within normal limits  RETICULOCYTES - Abnormal; Notable for the following components:   RBC. 3.25 (*)    Immature Retic Fract 18.2 (*)    All other components within normal limits  CULTURE, BLOOD (ROUTINE X 2)  CULTURE, BLOOD (ROUTINE X 2)  LACTIC ACID, PLASMA  LACTIC ACID, PLASMA  URINALYSIS, ROUTINE W REFLEX MICROSCOPIC  VITAMIN B12  FOLATE  IRON AND TIBC  FERRITIN  OCCULT BLOOD X 1 CARD TO LAB, STOOL  PREPARE RBC (CROSSMATCH)  TYPE AND SCREEN     EKG AV dual paced rhythm, ventricular rate 60.  PR 198, QRS 162, QTc 42.  No acute ST elevations.  No Sgarbossa criteria.   RADIOLOGY Chest x-ray: Increasing right pleural effusion and basilar opacity   I also independently reviewed and agree with radiologist  interpretations.   PROCEDURES:  Critical Care performed: Yes, see critical care procedure note(s)  .Critical Care  Performed by: Shaune Pollack, MD Authorized by: Shaune Pollack, MD   Critical care provider statement:    Critical care time (minutes):  30   Critical care time was exclusive of:  Separately billable procedures and treating other patients   Critical care was necessary to treat or prevent imminent or life-threatening deterioration of the following conditions:  Cardiac failure, circulatory failure and respiratory failure   Critical care was time spent personally by me on the following activities:  Development of treatment plan with patient or surrogate, discussions with consultants, evaluation of patient's response to treatment, examination of patient, ordering and review of laboratory studies, ordering and review of radiographic studies, ordering and performing treatments and interventions, pulse oximetry, re-evaluation of patient's condition and review of old charts     MEDICATIONS ORDERED IN ED: Medications  0.9 %  sodium chloride infusion (has no administration in time range)  ceFEPIme (MAXIPIME) 2 g in sodium chloride 0.9 % 100 mL IVPB (has no administration in time range)  vancomycin (VANCOREADY) IVPB 1500 mg/300 mL (has no administration in time range)     IMPRESSION / MDM / ASSESSMENT AND PLAN / ED COURSE  I reviewed the triage vital signs and the nursing notes.                               The patient is on the cardiac monitor to evaluate for evidence of arrhythmia and/or significant heart rate changes.   Ddx:  Differential includes the following, with pertinent life- or limb-threatening emergencies considered:  Generalized weakness due to anemia, AKI, occult infection such as cellulitis or UTI, medication effect, deconditioning  Patient's presentation is most consistent with acute presentation with potential threat to life or bodily function.  MDM:   88 year old male with past medical history of hypertension, hyperlipidemia, diabetes, A-fib on Eliquis here with abnormal labs and generalized weakness.  The facility initially reported a white count of over 30,000 which I think was likely error.  However, he does have acute on chronic anemia with hemoglobin of 6.9.  He has chronic microcytic anemia likely from GI loss given his intermittent dark stools and history  of anticoagulant use.  This would certainly explain his generalized weakness.  Lactic acid is normal, do not suspect ischemic bowel.  CMP is consistent with likely mild dehydration with elevated BUN to creatinine ratio but is otherwise reassuring.  His abdomen is completely soft and nontender, do not suspect intra-abdominal emergency.  Will send screening laboratory evaluation, plan to admit for transfusion and monitoring.   Patient's wounds were unwrapped, and patient has what appears to be possible mild infection of the right heel ulcer.  Chest x-ray also read as possible pneumonia.  Given his leukocytosis, will cover empirically for this.  Culture sent.  Admit to medicine.   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   Shaune Pollack, MD 05/08/22  [phone]

## 2022-05-08 NOTE — ED Triage Notes (Signed)
First Nurse: Pt here via [facility] with weakness for a few days, pt white count 30, 000 per primary provider. Pt denies pain, fever, or N/V.

## 2022-05-08 NOTE — ED Triage Notes (Addendum)
Pt here via [facility] from [facility] with abnormal labs. Pt white count was found to be 30,000. Pt has bilateral feet wounds, currently being treated as well and a recent pacemaker placement. Pt denies pain.

## 2022-05-08 NOTE — Consult Note (Signed)
PHARMACY -  BRIEF ANTIBIOTIC NOTE   Pharmacy has received consult(s) for vancomycin and cefepime from an ED provider.  The patient's profile has been reviewed for ht/wt/allergies/indication/available labs.    One time order(s) placed for  --Cefepime 2 g IV  --Vancomycin 1.5 g IV   Further antibiotics/pharmacy consults should be ordered by admitting physician if indicated.                       Thank you, Tressie Ellis 05/08/2022  6:19 PM

## 2022-05-08 NOTE — Progress Notes (Signed)
[facility] ED [facility] Hospital Liaison note: ? ?This patient is currently enrolled in [facility] outpatient-based Palliative Care. Will continue to follow for disposition. ? ?Please call with any outpatient palliative questions or concerns. ? ?Thank you, ?Dee Curry, LPN ?[facility] Hospital Liaison ?[phone] ?

## 2022-05-09 DIAGNOSIS — D500 Iron deficiency anemia secondary to blood loss (chronic): Secondary | ICD-10-CM | POA: Diagnosis not present

## 2022-05-09 DIAGNOSIS — D62 Acute posthemorrhagic anemia: Secondary | ICD-10-CM | POA: Diagnosis not present

## 2022-05-09 LAB — CBC
HCT: 26.9 % — ABNORMAL LOW (ref 39.0–52.0)
Hemoglobin: 8 g/dL — ABNORMAL LOW (ref 13.0–17.0)
MCH: 22.2 pg — ABNORMAL LOW (ref 26.0–34.0)
MCHC: 29.7 g/dL — ABNORMAL LOW (ref 30.0–36.0)
MCV: 74.5 fL — ABNORMAL LOW (ref 80.0–100.0)
Platelets: 319 10*3/uL (ref 150–400)
RBC: 3.61 MIL/uL — ABNORMAL LOW (ref 4.22–5.81)
RDW: 18.2 % — ABNORMAL HIGH (ref 11.5–15.5)
WBC: 10.4 10*3/uL (ref 4.0–10.5)
nRBC: 0 % (ref 0.0–0.2)

## 2022-05-09 LAB — URINALYSIS, ROUTINE W REFLEX MICROSCOPIC
Bilirubin Urine: NEGATIVE
Glucose, UA: NEGATIVE mg/dL
Hgb urine dipstick: NEGATIVE
Ketones, ur: 20 mg/dL — AB
Nitrite: NEGATIVE
Protein, ur: 30 mg/dL — AB
Specific Gravity, Urine: 1.018 (ref 1.005–1.030)
Squamous Epithelial / HPF: NONE SEEN (ref 0–5)
WBC, UA: >50 WBC/hpf — ABNORMAL HIGH (ref 0–5)
pH: 6 (ref 5.0–8.0)

## 2022-05-09 LAB — TYPE AND SCREEN
ABO/RH(D): O POS
Antibody Screen: NEGATIVE
Unit division: 0

## 2022-05-09 LAB — BASIC METABOLIC PANEL
Anion gap: 5 (ref 5–15)
BUN: 22 mg/dL (ref 8–23)
CO2: 23 mmol/L (ref 22–32)
Calcium: 8.5 mg/dL — ABNORMAL LOW (ref 8.9–10.3)
Chloride: 109 mmol/L (ref 98–111)
Creatinine, Ser: 0.81 mg/dL (ref 0.61–1.24)
GFR, Estimated: >60 mL/min (ref >60)
Glucose, Bld: 152 mg/dL — ABNORMAL HIGH (ref 70–99)
Potassium: 4.9 mmol/L (ref 3.5–5.1)
Sodium: 137 mmol/L (ref 135–145)

## 2022-05-09 LAB — CBG MONITORING, ED
Glucose-Capillary: 157 mg/dL — ABNORMAL HIGH (ref 70–99)
Glucose-Capillary: 126 mg/dL — ABNORMAL HIGH (ref 70–99)
Glucose-Capillary: 106 mg/dL — ABNORMAL HIGH (ref 70–99)
Glucose-Capillary: 104 mg/dL — ABNORMAL HIGH (ref 70–99)

## 2022-05-09 LAB — BPAM RBC
Blood Product Expiration Date: 202309292359
ISSUE DATE / TIME: 202308222210
Unit Type and Rh: 5100

## 2022-05-09 LAB — HEMOGLOBIN A1C
Hgb A1c MFr Bld: 8 % — ABNORMAL HIGH (ref 4.8–5.6)
Mean Plasma Glucose: 182.9 mg/dL

## 2022-05-09 MED ORDER — FERROUS SULFATE 325 (65 FE) MG PO TABS: 325.0000 mg | ORAL_TABLET | Freq: Two times a day (BID) | ORAL | 0 refills | Status: AC

## 2022-05-09 MED ORDER — CHOLECALCIFEROL 25 MCG (1000 UT) PO TABS: 1000.0000 [IU] | ORAL_TABLET | Freq: Every day | ORAL | Status: AC

## 2022-05-09 MED ORDER — CEPHALEXIN 500 MG PO CAPS: 500.0000 mg | ORAL_CAPSULE | Freq: Three times a day (TID) | ORAL | 0 refills | Status: AC

## 2022-05-09 MED ORDER — TRAMADOL HCL 50 MG PO TABS: 50.0000 mg | ORAL_TABLET | Freq: Four times a day (QID) | ORAL | 0 refills | Status: AC | PRN

## 2022-05-09 MED ORDER — CEPHALEXIN 500 MG PO CAPS
500.0000 mg | ORAL_CAPSULE | Freq: Three times a day (TID) | ORAL | Status: DC
  Administered 2022-05-09: 500 mg via ORAL
  Filled 2022-05-09: qty 1

## 2022-05-09 MED ORDER — TRAMADOL HCL 50 MG PO TABS: 50.0000 mg | ORAL_TABLET | Freq: Four times a day (QID) | ORAL | 0 refills | Status: DC | PRN

## 2022-05-09 MED ORDER — MEDIHONEY WOUND/BURN DRESSING EX PSTE
1.0000 | PASTE | Freq: Every day | CUTANEOUS | Status: DC
  Administered 2022-05-09: 1 via TOPICAL
  Filled 2022-05-09: qty 1

## 2022-05-09 MED ORDER — MEDIHONEY WOUND/BURN DRESSING EX PSTE
1.0000 | PASTE | Freq: Every day | CUTANEOUS | 0 refills | Status: AC
Start: 2022-05-09 — End: 2022-06-08

## 2022-05-09 NOTE — ED Notes (Signed)
Called [facility] for transport to [place]  

## 2022-05-09 NOTE — Consult Note (Signed)
WOC Nurse Consult Note: Reason for Consult: bilateral heel ulcers Patient has wound care per [facility], reviewed palliative care notes as well  Xray right foot negative. Wound type: Stage 3 Pressure injury: right heel Stage 3 Pressure injury: right plantar/heel area  Pressure Injury POA: Yes Measurement: see nursing flow sheet  Wound bed: Left heel: smaller wound; 90% yellow/10% pale pink, mild peri wound maceration Right heel: 80% yellow ?tendon exposure/ 20% pink, maceration along distal wound edge from 3-9 o'clock, severe epibole from 9-11 o'clock (common in chronic open wounds).  Drainage (amount, consistency, odor) serosanguinous on dressings  Periwound: see above  Dressing procedure/placement/frequency: Add medihoney to clear wounds of non viable tissue. Top with dry dressing and secure with kerlix.  Do not top with moist gauze, periwounds are most likely macerated from wet gauze over the wound bed that extends on the periwound tissue.  Offload the heels at all times with Prevalon boots, however noted palliative care RN noted patient does not like the "boots with the hole in them".  Consider referral to outpatient wound care center of patient's choice if not in place already.  Palliative care RN noted request for family to make patient an appointment   Discussed POC with patient and bedside nurse.  Re consult if needed, will not follow at this time. Thanks  Giannie Soliday [employer], RN,CWOCN, CNS, CWON-AP [phone])

## 2022-05-09 NOTE — Hospital Course (Addendum)
89 with Acute on chronic anemia hx of Atrial fibrillation on eliquis, DM2. HLD, HTN, BPH, GERD, iron deficiency anemia, hypothyroidism, hard of hearing  presented w/ weakness.Per Mr. Karlen, he has been feeling weaker for the past week with low energy and generalized weakness of his legs.He has chronic heal ulcers which are being treated with wound care.  There was report of a WBC of 30 at his ALF, however, on repeat here his WBC was 10.7.  His Hgb was noted to be 6.9, however.  Iron level today is 11, Saturation is 4, ferritin 64. Per chart review, it appears he has a chronic Hgb around 8.He had a drop in Hgb to 5.6 in December of 2022.  EDP discussed case with family member (daughter) and patient would consent to blood transfusion, but does not desire a long hospitalization.  He was a previous hospice patient, but revoked this status for pacemaker batter change on 04/25/22. In ED- evated K at 5.4, Cr of 1.08 with BUN of 31.Heel wounds noted.  Transfused 1 unit PRBC, admitted for mild AKI hyperkalemia anemia chronic elevated with possible overlying cellulitis. Patient received 1 unit PRBC with improvement in hemoglobin.  AKI has resolved.  At this time remains hemodynamically stable, seen by wound care for patient's chronic heel wound for which he will need outpatient follow-up.  Continue offloading the heel at all times with Prevalon boots. Patient is enrolled in [facility] outpatient with palliative care and he will continue to follow-up with him.

## 2022-05-09 NOTE — Discharge Summary (Signed)
Physician Discharge Summary  [name] [name] RJJ:[id number] DOB: Jun 17, 1928 DOA: 05/08/2022  PCP: Smiley Houseman, NP  Admit date: 05/08/2022 Discharge date: 05/09/2022 Recommendations for Outpatient Follow-up:  Follow up with PCP in 1 weeks-call for appointment Please obtain BMP/CBC in one week  Discharge Dispo: ALF Discharge Condition: Stable Code Status:   Code Status: DNR Diet recommendation:  Diet Order             Diet - low sodium heart healthy           Diet Carb Modified Fluid consistency: Thin; Room service appropriate? Yes  Diet effective now                    Brief/Interim Summary: 89 with Acute on chronic anemia hx of Atrial fibrillation on eliquis, DM2. HLD, HTN, BPH, GERD, iron deficiency anemia, hypothyroidism, hard of hearing  presented w/ weakness.Per [name] [name], [name] [name] has been feeling weaker for the past week with low energy and generalized weakness of his legs.[name] [name] has chronic heal ulcers which are being treated with wound care.  There was report of a WBC of 30 at his ALF, however, on repeat here his WBC was 10.7.  His Hgb was noted to be 6.9, however.  Iron level today is 11, Saturation is 4, ferritin 64. Per chart review, it appears [name] [name] has a chronic Hgb around 8.[name] [name] had a drop in Hgb to 5.6 in December of 2022.  EDP discussed case with family member (daughter) and [name] [name] would consent to blood transfusion, but does not desire a long hospitalization.  [name] [name] was a previous hospice [name] [name], but revoked this status for pacemaker batter change on 04/25/22. In ED- evated K at 5.4, Cr of 1.08 with BUN of 31.Heel wounds noted.  Transfused 1 unit PRBC, admitted for mild AKI hyperkalemia anemia chronic elevated with possible overlying cellulitis. [name] [name] received 1 unit PRBC with improvement in hemoglobin.  AKI has resolved.  At this time remains hemodynamically stable, seen by wound care for [name] [name]'s chronic heel wound for which [name] [name] will need outpatient follow-up.  Continue offloading the  heel at all times with Prevalon boots. [name] [name] is enrolled in [facility] outpatient with palliative care and [name] [name] will continue to follow-up with him.   Discharge Diagnoses:  Principal Problem:   Acute on chronic blood loss anemia Active Problems:   Atrial fibrillation (HCC)   Diabetes mellitus without complication (HCC)   Hypothyroidism   BPH (benign prostatic hyperplasia)   Hyperlipidemia   Iron deficiency anemia  Acute on chronic blood loss anemia Iron deficiency anemia-with long term iron deficiency anemia: likely multifactorial with chronic GI loss while on eliquis and nutritional deficits with low MCV S/p PRBC X 1.Repeat H&H improved to 8 g baseline. Will increase ferrous sulfate to BID.No extensive work up desired by [name] [name] or family/hem occult pending.  Eliquis held here will resume and [name] [name]'s daughter will discuss with [name] [name], advised to check CBC in 1 week, if has recurrent anemia may be worth stopping Eliquis at that time or sooner- will defer to his pcp/cardio. Recent Labs  Lab 05/08/22 1343 05/09/22 0531  HGB 6.9* 8.0*  HCT 23.5* 26.9*    Pyuria:But [name] [name] denies any urinary symptoms.on keflex for wound.   Mild AKI Mild Hyperkalemia: Resolved creatinine 0.8 potassium 4.9   Pleural effusion on imaging, no SOB or hypoxia:Resume home Aldactone on discharge.    Hx of Atrial fibrillation: currently V paced rhythm.[name] [name] has been on Eliquis.Daughter to d/w [name] [name];s doctor at facility regarding  eliquis- [name] [name] is on low dose.   Chronic heel wounds: Appears chronic seen by wound care, will need outpatient wound care follow-up. Add keflex po on d/c.  Diabetes mellitus without complication holding home Januvia continue SSI for now     Hypothyroidism: cont home synthroid. TSH is 0.2- recheck 6wk time.    BPH: cont finasteride and tamsulosin  Hyperlipidemia: cont zetia GOC- DNR. Was on hospice- was taken off hospice for PM incision. Advised to d/w her physician.  Recent  fall/deconditioning:[name] [name] is from ALF. PTOT to resume at ALF per daughter,[name] [name] does not want to consider SNF.  I discussed the plan of care with [name] [name]'s daughter over the phone she desires the [name] [name] to return to the facility today. To continue wound care daily at the facility and has an appointment to see him on 7 over early September, advised check CBC in a week.  Advised to discuss with [name] [name]'s primary care physician about risks and benefits in regard to eliquis in the light of his anemia especially if [name] [name] has recurrent anemia.  PM Incision site has no tenderness or redness or swelling or bruise.  Consults: Wound care Subjective: Alert awake oriented resting comfortably complains of pain on the wound No urinary symptoms.  Discharge Exam: Vitals:   05/09/22 1015 05/09/22 1029  BP:    Pulse: (!) 59   Resp:    Temp:  98.6 F (37 C)  SpO2: 99%    General: [name] [name] is alert, awake, not in acute distress Cardiovascular: RRR, S1/S2 +, no rubs, no gallops Respiratory: CTA bilaterally, no wheezing, no rhonchi Abdominal: Soft, NT, ND, bowel sounds + Extremities: no edema, no cyanosis  Discharge Instructions  Discharge Instructions     Diet - low sodium heart healthy   Complete by: As directed    Discharge instructions   Complete by: As directed    Please call call MD or return to ER for similar or worsening recurring problem that brought you to hospital or if any fever,nausea/vomiting,abdominal pain, uncontrolled pain, chest pain,  shortness of breath or any other alarming symptoms.  Please check CBC in 1 week at the facility.  Continue daily wound care and follow-up with the wound care clinic and also tomorrow as scheduled or sooner if any signs of infection noted  Please have the primary care doctor discuss with the [name] [name] and family about risk benefit with ongoing Eliquis use in the setting of anemia especially if [name] [name] has recurrent anemia.   Please follow-up your doctor as  instructed in a week time and call the office for appointment.  Please avoid alcohol, smoking, or any other illicit substance and maintain healthy habits including taking your regular medications as prescribed.  You were cared for by a hospitalist during your hospital stay. If you have any questions about your discharge medications or the care you received while you were in the hospital after you are discharged, you can call the unit and ask to speak with the hospitalist on call if the hospitalist that took care of you is not available.  Once you are discharged, your primary care physician will handle any further medical issues. Please note that NO REFILLS for any discharge medications will be authorized once you are discharged, as it is imperative that you return to your primary care physician (or establish a relationship with a primary care physician if you do not have one) for your aftercare needs so that they can reassess your need for medications and monitor your lab  values   Discharge wound care:   Complete by: As directed    Cleanse bilateral heel wounds with saline, pat dry  Apply Medihoney to the heel wounds, top with dry dressing, secure with kerlix.Change daily   Increase activity slowly   Complete by: As directed       Allergies as of 05/09/2022       Reactions   Statins Palpitations        Medication List     STOP taking these medications    diltiazem 180 MG 24 hr capsule Commonly known as: TIAZAC   furosemide 20 MG tablet Commonly known as: LASIX       TAKE these medications    acetaminophen 650 MG CR tablet Commonly known as: TYLENOL Take 650 mg by mouth every 8 (eight) hours.   acetaminophen 500 MG tablet Commonly known as: TYLENOL Take 500 mg by mouth every 8 (eight) hours as needed for mild pain.   apixaban 2.5 MG Tabs tablet Commonly known as: ELIQUIS Take 1 tablet (2.5 mg total) by mouth 2 (two) times daily.   ascorbic acid 500 MG tablet Commonly  known as: VITAMIN C Take 500 mg by mouth 2 (two) times daily.   CALAZIME SKIN PROTECTANT EX Apply 1 application topically as needed (periwound area).   cephALEXin 500 MG capsule Commonly known as: KEFLEX Take 1 capsule (500 mg total) by mouth every 8 (eight) hours for 7 days.   Cholecalciferol 25 MCG (1000 UT) tablet Take 1 tablet (1,000 Units total) by mouth daily. What changed: how much to take   cyanocobalamin 1000 MCG tablet Take 1,000 mcg by mouth daily.   ezetimibe 10 MG tablet Commonly known as: ZETIA Take 10 mg by mouth daily.   ferrous sulfate 325 (65 FE) MG tablet Take 1 tablet (325 mg total) by mouth 2 (two) times daily with a meal. What changed: when to take this   finasteride 5 MG tablet Commonly known as: PROSCAR Take 5 mg by mouth daily.   gabapentin 300 MG capsule Commonly known as: NEURONTIN Take 300 mg by mouth daily.   leptospermum manuka honey Pste paste Apply 1 Application topically daily.   levothyroxine 125 MCG tablet Commonly known as: SYNTHROID Take 125 mcg by mouth daily before breakfast.   Metamucil Smooth Texture 58.6 % powder Generic drug: psyllium Take 1 packet by mouth every evening.   metFORMIN 500 MG tablet Commonly known as: GLUCOPHAGE Take 500 mg by mouth 2 (two) times daily.   multivitamin with minerals Tabs tablet Take 1 tablet by mouth daily.   multivitamins ther. w/minerals Tabs tablet Take 1 tablet by mouth daily.   pantoprazole 40 MG tablet Commonly known as: PROTONIX Take 1 tablet (40 mg total) by mouth daily.   povidone-iodine 10 % external solution Commonly known as: BETADINE Apply 1 Application topically daily.   Santyl 250 UNIT/GM ointment Generic drug: collagenase Apply 1 Application topically daily.   senna-docusate 8.6-50 MG tablet Commonly known as: Senokot-S Take 1 tablet by mouth 3 (three) times a week. Monday, Wednesday, Friday   sertraline 25 MG tablet Commonly known as: ZOLOFT Take 25 mg by  mouth at bedtime. X 2 weeks then increase to 50 mg nightlty   sitaGLIPtin 100 MG tablet Commonly known as: JANUVIA Take 100 mg by mouth daily.   spironolactone 25 MG tablet Commonly known as: ALDACTONE Take 25 mg by mouth daily.   tamsulosin 0.4 MG Caps capsule Commonly known as: FLOMAX Take 0.4 mg by  mouth at bedtime.   traMADol 50 MG tablet Commonly known as: Ultram Take 1 tablet (50 mg total) by mouth every 6 (six) hours as needed for up to 6 doses.   Zinc Sulfate 220 (50 Zn) MG Tabs Take 220 mg by mouth in the morning and at bedtime. What changed: Another medication with the same name was removed. Continue taking this medication, and follow the directions you see here.               Discharge Care Instructions  (From admission, onward)           Start     Ordered   05/09/22 0000  Discharge wound care:       Comments: Cleanse bilateral heel wounds with saline, pat dry  Apply Medihoney to the heel wounds, top with dry dressing, secure with kerlix.Change daily   05/09/22 1336            Follow-up Information     Smiley Houseman, NP Follow up in 1 week(s).   Specialty: Family Medicine Contact information: [address] [place] [address] [phone]                Allergies  Allergen Reactions   Statins Palpitations    The results of significant diagnostics from this hospitalization (including imaging, microbiology, ancillary and laboratory) are listed below for reference.    Microbiology: Recent Results (from the past 240 hour(s))  Culture, blood (Routine x 2)     Status: None (Preliminary result)   Collection Time: 05/08/22  1:43 PM   Specimen: BLOOD  Result Value Ref Range Status   Specimen Description BLOOD RIGHT ANTECUBITAL  Final   Special Requests   Final    BOTTLES DRAWN AEROBIC AND ANAEROBIC Blood Culture results may not be optimal due to an excessive volume of blood received in culture bottles   Culture   Final    NO  GROWTH < 24 HOURS Performed at [facility], [address] [place], [place] [address]    Report Status PENDING  Incomplete    Procedures/Studies: DG Foot Complete Right  Result Date: 05/08/2022 CLINICAL DATA:  RIGHT heel wound EXAM: RIGHT FOOT COMPLETE - 3+ VIEW COMPARISON:  None FINDINGS: Marked osseous demineralization. Mild degenerative changes first MTP joint. Remaining joint spaces preserved. Soft tissue swelling RIGHT foot. Extensive small vessel vascular calcifications. No acute fracture, dislocation, or bone destruction. IMPRESSION: No acute osseous abnormalities. Electronically Signed   By: Ulyses Southward M.D.   On: 05/08/2022 18:49   DG Chest Portable 1 View  Result Date: 05/08/2022 CLINICAL DATA:  Weakness.  Abnormal labs. EXAM: PORTABLE CHEST 1 VIEW COMPARISON:  Chest radiograph 09/12/2021 FINDINGS: Right pleural effusion and basilar opacity, increased from prior exam. No definite left pleural effusion on this portable AP view. Dual lead left-sided pacemaker in place. Cardiomegaly is stable. Unchanged mediastinal contours. No pulmonary edema. No pneumothorax. IMPRESSION: Increasing right pleural effusion and basilar opacity, atelectasis versus pneumonia. Electronically Signed   By: Narda Rutherford M.D.   On: 05/08/2022 17:50    Labs: BNP (last 3 results) No results for input(s): "BNP" in the last 8760 hours. Basic Metabolic Panel: Recent Labs  Lab 05/08/22 1343 05/09/22 0531  NA 136 137  K 5.4* 4.9  CL 106 109  CO2 24 23  GLUCOSE 242* 152*  BUN 31* 22  CREATININE 1.08 0.81  CALCIUM 8.9 8.5*   Liver Function Tests: Recent Labs  Lab 05/08/22 1343  AST  16  ALT 14  ALKPHOS 71  BILITOT 0.5  PROT 7.8  ALBUMIN 2.6*   No results for input(s): "LIPASE", "AMYLASE" in the last 168 hours. No results for input(s): "AMMONIA" in the last 168 hours. CBC: Recent Labs  Lab 05/08/22 1343 05/09/22 0531  WBC 10.7* 10.4  NEUTROABS 9.1*  --   HGB 6.9* 8.0*  HCT  23.5* 26.9*  MCV 73.0* 74.5*  PLT 383 319   Cardiac Enzymes: No results for input(s): "CKTOTAL", "CKMB", "CKMBINDEX", "TROPONINI" in the last 168 hours. BNP: Invalid input(s): "POCBNP" CBG: Recent Labs  Lab 05/09/22 0149 05/09/22 0756 05/09/22 1212  GLUCAP 157* 126* 106*   D-Dimer No results for input(s): "DDIMER" in the last 72 hours. Hgb A1c Recent Labs    05/08/22 2026  HGBA1C 8.0*   Lipid Profile No results for input(s): "CHOL", "HDL", "LDLCALC", "TRIG", "CHOLHDL", "LDLDIRECT" in the last 72 hours. Thyroid function studies Recent Labs    05/08/22 2026  TSH 0.240*   Anemia work up Recent Labs    05/08/22 1343 05/08/22 1720  VITAMINB12  --  1,135*  FOLATE >40.0  --   FERRITIN 64  --   TIBC 263  --   IRON 11*  --   RETICCTPCT 1.2  --    Urinalysis    Component Value Date/Time   COLORURINE YELLOW (A) 05/08/2022 1343   APPEARANCEUR HAZY (A) 05/08/2022 1343   LABSPEC 1.018 05/08/2022 1343   PHURINE 6.0 05/08/2022 1343   GLUCOSEU NEGATIVE 05/08/2022 1343   HGBUR NEGATIVE 05/08/2022 1343   BILIRUBINUR NEGATIVE 05/08/2022 1343   KETONESUR 20 (A) 05/08/2022 1343   PROTEINUR 30 (A) 05/08/2022 1343   NITRITE NEGATIVE 05/08/2022 1343   LEUKOCYTESUR SMALL (A) 05/08/2022 1343   Sepsis Labs Recent Labs  Lab 05/08/22 1343 05/09/22 0531  WBC 10.7* 10.4   Microbiology Recent Results (from the past 240 hour(s))  Culture, blood (Routine x 2)     Status: None (Preliminary result)   Collection Time: 05/08/22  1:43 PM   Specimen: BLOOD  Result Value Ref Range Status   Specimen Description BLOOD RIGHT ANTECUBITAL  Final   Special Requests   Final    BOTTLES DRAWN AEROBIC AND ANAEROBIC Blood Culture results may not be optimal due to an excessive volume of blood received in culture bottles   Culture   Final    NO GROWTH < 24 HOURS Performed at [facility], [address] [place], [place] [address]    Report Status PENDING  Incomplete     Time  coordinating discharge: 25 minutes  SIGNED: Lanae Boast, MD  [facility] 05/09/2022, 1:37 PM  If 7PM-7AM, please contact night-coverage [email or web address]

## 2022-05-09 NOTE — [place] FL2 (Signed)
[place] MEDICAID FL2 LEVEL OF CARE SCREENING TOOL     IDENTIFICATION  Patient Name: [name] [name] Birthdate: [birth year] Sex: male Admission Date (Current Location): 05/08/2022  [place] and [place] Number:  Chiropodist and Address:  [facility], [address], [place], [place] [address]      Provider Number: [id number]  Attending Physician Name and Address:  Lanae Boast, MD  Relative Name and Phone Number:  [name]- daughter- [phone]    Current Level of Care: Hospital Recommended Level of Care: Assisted Living Facility Prior Approval Number:    Date Approved/Denied:   PASRR Number:    Discharge Plan: Domiciliary (Rest home) (ALF)    Current Diagnoses: Patient Active Problem List   Diagnosis Date Noted   Acute on chronic blood loss anemia 05/08/2022   BPH (benign prostatic hyperplasia) 05/08/2022   Hyperlipidemia 05/08/2022   Iron deficiency anemia 05/08/2022   Blurred vision 09/13/2021   AKI (acute kidney injury) (HCC) 09/13/2021   Hypotension 09/13/2021   SSS (sick sinus syndrome) (HCC) 09/13/2021   H/O syncope 09/13/2021   Atrial fibrillation (HCC) 09/13/2021   Pressure injury of skin 09/13/2021   Hypoxia 09/12/2021   Sepsis with acute hypoxic respiratory failure and septic shock (HCC)    Type 2 diabetes mellitus with hyperglycemia (HCC)    Shock (HCC) 08/18/2021   Anemia    Gross hematuria 09/10/2020   Diabetes mellitus without complication (HCC)    Hypertension    PAD (peripheral artery disease) (HCC)    Osteomyelitis, chronic, lower leg, left (HCC)    Hypothyroidism    Lung nodule seen on imaging study     Orientation RESPIRATION BLADDER Height & Weight     Self, Time, Situation, Place  Normal Continent Weight: 64.9 kg Height:  5\' 11"  (180.3 cm)  BEHAVIORAL SYMPTOMS/MOOD NEUROLOGICAL BOWEL NUTRITION STATUS      Continent Diet (no added salt)  AMBULATORY STATUS COMMUNICATION OF NEEDS Skin   Supervision  Verbally PU Stage and Appropriate Care (see wound care orders for bilateral heel ulcers)                       Personal Care Assistance Level of Assistance  Bathing, Feeding, Dressing Bathing Assistance: Limited assistance Feeding assistance: Independent Dressing Assistance: Limited assistance     Functional Limitations Info             SPECIAL CARE FACTORS FREQUENCY  PT (By licensed PT), OT (By licensed OT)     PT Frequency: Home Health PT- 3 times per week OT Frequency: home health 2 times per week            Contractures Contractures Info: Not present    Additional Factors Info  Code Status, Allergies Code Status Info: DNR Allergies Info: Statins           Current Medications (05/09/2022):  This is the current hospital active medication list Current Facility-Administered Medications  Medication Dose Route Frequency Provider Last Rate Last Admin   acetaminophen (TYLENOL) tablet 500 mg  500 mg Oral Q8H PRN 05/11/2022, MD       ascorbic acid (VITAMIN C) tablet 500 mg  500 mg Oral BID Inez Catalina B, MD   500 mg at 05/09/22 0929   cephALEXin (KEFLEX) capsule 500 mg  500 mg Oral Q8H Kc, Ramesh, MD       cholecalciferol (VITAMIN D3) 25 MCG (1000 UNIT) tablet 1,000 Units  1,000 Units Oral Daily  Sid Falcon, MD   1,000 Units at 05/09/22 [id number]   ezetimibe (ZETIA) tablet 10 mg  10 mg Oral Daily Benita Gutter, RPH   10 mg at 05/09/22 [id number]   finasteride (PROSCAR) tablet 5 mg  5 mg Oral Daily Benita Gutter, RPH   5 mg at 05/09/22 0934   insulin aspart (novoLOG) injection 0-9 Units  0-9 Units Subcutaneous TID WC Sid Falcon, MD   1 Units at 05/09/22 [id number]   leptospermum manuka honey (MEDIHONEY) paste 1 Application  1 Application Topical Daily Kc, Maren Beach, MD       levothyroxine (SYNTHROID) tablet 125 mcg  125 mcg Oral QAC breakfast Gilles Chiquito B, MD   125 mcg at 05/09/22 0649   metoprolol tartrate (LOPRESSOR) injection 5 mg  5 mg Intravenous Q6H PRN  Sid Falcon, MD       multivitamin with minerals tablet 1 tablet  1 tablet Oral Daily Gilles Chiquito B, MD   1 tablet at 05/09/22 0928   ondansetron (ZOFRAN) tablet 4 mg  4 mg Oral Q6H PRN Sid Falcon, MD       Or   ondansetron [facility]) injection 4 mg  4 mg Intravenous Q6H PRN Gilles Chiquito B, MD       oxyCODONE (Oxy IR/ROXICODONE) immediate release tablet 5 mg  5 mg Oral Q4H PRN Sid Falcon, MD       pantoprazole (PROTONIX) EC tablet 40 mg  40 mg Oral Daily Lockie Mola B, RPH   40 mg at 05/09/22 [id number]   sertraline (ZOLOFT) tablet 25 mg  25 mg Oral QHS Gilles Chiquito B, MD   25 mg at 05/08/22 2228   sodium chloride flush (NS) 0.9 % injection 3 mL  3 mL Intravenous Q12H Gilles Chiquito B, MD   3 mL at 05/09/22 0934   tamsulosin (FLOMAX) capsule 0.4 mg  0.4 mg Oral QHS Gilles Chiquito B, MD   0.4 mg at 05/08/22 2228   zinc sulfate capsule 220 mg  220 mg Oral Daily Gilles Chiquito B, MD   220 mg at 05/09/22 [id number]   Current Outpatient Medications  Medication Sig Dispense Refill   apixaban (ELIQUIS) 2.5 MG TABS tablet Take 1 tablet (2.5 mg total) by mouth 2 (two) times daily. 60 tablet    cyanocobalamin 1000 MCG tablet Take 1,000 mcg by mouth daily.     ezetimibe (ZETIA) 10 MG tablet Take 10 mg by mouth daily.     finasteride (PROSCAR) 5 MG tablet Take 5 mg by mouth daily.     gabapentin (NEURONTIN) 300 MG capsule Take 300 mg by mouth daily.     levothyroxine (SYNTHROID) 125 MCG tablet Take 125 mcg by mouth daily before breakfast.     metFORMIN (GLUCOPHAGE) 500 MG tablet Take 500 mg by mouth 2 (two) times daily.     Multiple Vitamins-Minerals (MULTIVITAMINS THER. W/MINERALS) TABS tablet Take 1 tablet by mouth daily.     pantoprazole (PROTONIX) 40 MG tablet Take 1 tablet (40 mg total) by mouth daily.     povidone-iodine (BETADINE) 10 % external solution Apply 1 Application topically daily.     psyllium (METAMUCIL SMOOTH TEXTURE) 58.6 % powder Take 1 packet by mouth every evening.     SANTYL  250 UNIT/GM ointment Apply 1 Application topically daily.     senna-docusate (SENOKOT-S) 8.6-50 MG tablet Take 1 tablet by mouth 3 (three) times a week. Monday, Wednesday, Friday     sertraline (ZOLOFT) 25 MG  tablet Take 25 mg by mouth at bedtime. X 2 weeks then increase to 50 mg nightlty     sitaGLIPtin (JANUVIA) 100 MG tablet Take 100 mg by mouth daily.     spironolactone (ALDACTONE) 25 MG tablet Take 25 mg by mouth daily.     tamsulosin (FLOMAX) 0.4 MG CAPS capsule Take 0.4 mg by mouth at bedtime.     vitamin C (ASCORBIC ACID) 500 MG tablet Take 500 mg by mouth 2 (two) times daily.     Zinc Sulfate 220 (50 Zn) MG TABS Take 220 mg by mouth in the morning and at bedtime.     acetaminophen (TYLENOL) 500 MG tablet Take 500 mg by mouth every 8 (eight) hours as needed for mild pain.     acetaminophen (TYLENOL) 650 MG CR tablet Take 650 mg by mouth every 8 (eight) hours.     cephALEXin (KEFLEX) 500 MG capsule Take 1 capsule (500 mg total) by mouth every 8 (eight) hours for 7 days. 21 capsule 0   Cholecalciferol 25 MCG (1000 UT) tablet Take 1 tablet (1,000 Units total) by mouth daily.     ferrous sulfate 325 (65 FE) MG tablet Take 1 tablet (325 mg total) by mouth 2 (two) times daily with a meal. 60 tablet 0   leptospermum manuka honey (MEDIHONEY) PSTE paste Apply 1 Application topically daily. 30 mL 0   Multiple Vitamin (MULTIVITAMIN WITH MINERALS) TABS tablet Take 1 tablet by mouth daily.     Skin Protectants, Misc. (CALAZIME SKIN PROTECTANT EX) Apply 1 application topically as needed (periwound area).     traMADol (ULTRAM) 50 MG tablet Take 1 tablet (50 mg total) by mouth every 6 (six) hours as needed for up to 6 doses. 6 tablet 0     Discharge Medications: STOP taking these medications     diltiazem 180 MG 24 hr capsule Commonly known as: TIAZAC    furosemide 20 MG tablet Commonly known as: LASIX           TAKE these medications     acetaminophen 650 MG CR tablet Commonly known as:  TYLENOL Take 650 mg by mouth every 8 (eight) hours.    acetaminophen 500 MG tablet Commonly known as: TYLENOL Take 500 mg by mouth every 8 (eight) hours as needed for mild pain.    apixaban 2.5 MG Tabs tablet Commonly known as: ELIQUIS Take 1 tablet (2.5 mg total) by mouth 2 (two) times daily.    ascorbic acid 500 MG tablet Commonly known as: VITAMIN C Take 500 mg by mouth 2 (two) times daily.    CALAZIME SKIN PROTECTANT EX Apply 1 application topically as needed (periwound area).    cephALEXin 500 MG capsule Commonly known as: KEFLEX Take 1 capsule (500 mg total) by mouth every 8 (eight) hours for 7 days.    Cholecalciferol 25 MCG (1000 UT) tablet Take 1 tablet (1,000 Units total) by mouth daily. What changed: how much to take    cyanocobalamin 1000 MCG tablet Take 1,000 mcg by mouth daily.    ezetimibe 10 MG tablet Commonly known as: ZETIA Take 10 mg by mouth daily.    ferrous sulfate 325 (65 FE) MG tablet Take 1 tablet (325 mg total) by mouth 2 (two) times daily with a meal. What changed: when to take this    finasteride 5 MG tablet Commonly known as: PROSCAR Take 5 mg by mouth daily.    gabapentin 300 MG capsule Commonly known as: NEURONTIN Take 300  mg by mouth daily.    leptospermum manuka honey Pste paste Apply 1 Application topically daily.    levothyroxine 125 MCG tablet Commonly known as: SYNTHROID Take 125 mcg by mouth daily before breakfast.    Metamucil Smooth Texture 58.6 % powder Generic drug: psyllium Take 1 packet by mouth every evening.    metFORMIN 500 MG tablet Commonly known as: GLUCOPHAGE Take 500 mg by mouth 2 (two) times daily.    multivitamin with minerals Tabs tablet Take 1 tablet by mouth daily.    multivitamins ther. w/minerals Tabs tablet Take 1 tablet by mouth daily.    pantoprazole 40 MG tablet Commonly known as: PROTONIX Take 1 tablet (40 mg total) by mouth daily.    povidone-iodine 10 % external solution Commonly  known as: BETADINE Apply 1 Application topically daily.    Santyl 250 UNIT/GM ointment Generic drug: collagenase Apply 1 Application topically daily.    senna-docusate 8.6-50 MG tablet Commonly known as: Senokot-S Take 1 tablet by mouth 3 (three) times a week. Monday, Wednesday, Friday    sertraline 25 MG tablet Commonly known as: ZOLOFT Take 25 mg by mouth at bedtime. X 2 weeks then increase to 50 mg nightlty    sitaGLIPtin 100 MG tablet Commonly known as: JANUVIA Take 100 mg by mouth daily.    spironolactone 25 MG tablet Commonly known as: ALDACTONE Take 25 mg by mouth daily.    tamsulosin 0.4 MG Caps capsule Commonly known as: FLOMAX Take 0.4 mg by mouth at bedtime.    traMADol 50 MG tablet Commonly known as: Ultram Take 1 tablet (50 mg total) by mouth every 6 (six) hours as needed for up to 6 doses.    Zinc Sulfate 220 (50 Zn) MG Tabs Take 220 mg by mouth in the morning and at bedtime. What changed: Another medication with the same name was removed. Continue taking this medication, and follow the directions you see here.        Relevant Imaging Results:  Relevant Lab Results:   Additional Information Follow up with PCP in 1 week and repeat CBC in 1 week  Allayne Butcher, RN

## 2022-05-09 NOTE — Evaluation (Signed)
Physical Therapy Evaluation Patient Details Name: [name] [name] MRN: [id number] DOB: [birth year] Today's Date: 05/09/2022  History of Present Illness  87 with Acute on chronic anemia hx of Atrial fibrillation on eliquis, DM2. HLD, HTN, BPH, GERD, iron deficiency anemia, hypothyroidism, hard of hearing  presented w/ weakness. noted for anemia and AKI.   Clinical Impression  Patient alert, eager to go home but also very interested in PT services due to complaints of BLE weakness. Pt stated at baseline he is modI with ADLs, has supervision for bathing (someone in the apartment for safety), ambulatory with RW.    He was able to perform bed mobility with supervision, bed rails. Good sitting balance noted. Did don shoes and brief during session at pt request. He was able to demonstrate that he could perform these tasks but minA provided due to extended time needed to complete. Sit <> stand twice with RW and supervision-CGA. He did have 1-2 LOB posterior when initially coming up into standing where he returned to sitting, but once up in standing he did ambulate ~58ft with RW and CGA. No further loss of balance but pt did appear to have steppage like gait.  Overall the patient demonstrated deficits (see "PT Problem List") that impede the patient's functional abilities, safety, and mobility and would benefit from skilled PT intervention. Recommendation at this time is HHPT with intermittent supervision/assistance to maximize function, safety, and strength.      Recommendations for follow up therapy are one component of a multi-disciplinary discharge planning process, led by the attending physician.  Recommendations may be updated based on patient status, additional functional criteria and insurance authorization.  Follow Up Recommendations Home health PT      Assistance Recommended at Discharge Intermittent Supervision/Assistance  Patient can return home with the following  A little help with  bathing/dressing/bathroom;Assistance with cooking/housework;Assist for transportation;Help with stairs or ramp for entrance    Equipment Recommendations None recommended by PT  Recommendations for Other Services       Functional Status Assessment Patient has had a recent decline in their functional status and demonstrates the ability to make significant improvements in function in a reasonable and predictable amount of time.     Precautions / Restrictions Precautions Precautions: Fall Restrictions Weight Bearing Restrictions: No      Mobility  Bed Mobility Overal bed mobility: Needs Assistance Bed Mobility: Supine to Sit, Sit to Supine     Supine to sit: Supervision, HOB elevated Sit to supine: Supervision        Transfers Overall transfer level: Needs assistance Equipment used: Rolling walker (2 wheels) Transfers: Sit to/from Stand Sit to Stand: Min guard, Supervision           General transfer comment: 1-2 instances of falling back and landing on bed due to initial standing balance deficits. pt able to correct with time    Ambulation/Gait Ambulation/Gait assistance: Min guard Gait Distance (Feet): 30 Feet Assistive device: Rolling walker (2 wheels)         General Gait Details: pt with almost "steppage" like gait, no LOB  Stairs            Wheelchair Mobility    Modified Rankin (Stroke Patients Only)       Balance Overall balance assessment: Needs assistance Sitting-balance support: Feet supported Sitting balance-Leahy Scale: Good Sitting balance - Comments: able to don shoes and briefs, assisted minA after pt demonstrated he was able to do it   Standing balance support: Reliant on assistive device  for balance Standing balance-Leahy Scale: Fair                               Pertinent Vitals/Pain Pain Assessment Pain Assessment: No/denies pain    Home Living Family/patient expects to be discharged to:: Assisted living                  Home Equipment: Rolling Walker (2 wheels);Grab bars - tub/shower;Grab bars - toilet;Shower seat - built in      Prior Function Prior Level of Function : Independent/Modified Independent             Mobility Comments: modI at [employer] with RW, did endorse fatigue with longer distances ADLs Comments: fall alert buttons in apartment, supervision (someone in apartment for showering), facility provides meals/cleaning     Hand Dominance        Extremity/Trunk Assessment   Upper Extremity Assessment Upper Extremity Assessment: Overall WFL for tasks assessed    Lower Extremity Assessment Lower Extremity Assessment: Generalized weakness    Cervical / Trunk Assessment Cervical / Trunk Assessment: Kyphotic  Communication   Communication: HOH  Cognition Arousal/Alertness: Awake/alert Behavior During Therapy: WFL for tasks assessed/performed Overall Cognitive Status: Within Functional Limits for tasks assessed                                          General Comments      Exercises     Assessment/Plan    PT Assessment Patient needs continued PT services  PT Problem List Decreased strength;Decreased mobility;Decreased activity tolerance;Decreased balance;Decreased knowledge of use of DME       PT Treatment Interventions DME instruction;Therapeutic exercise;Gait training;Balance training;Stair training;Neuromuscular re-education;Functional mobility training;Therapeutic activities;Patient/family education    PT Goals (Current goals can be found in the Care Plan section)  Acute Rehab PT Goals Patient Stated Goal: to go home PT Goal Formulation: With patient Time For Goal Achievement: 05/23/22 Potential to Achieve Goals: Good    Frequency Min 2X/week     Co-evaluation               AM-PAC PT "6 Clicks" Mobility  Outcome Measure Help needed turning from your back to your side while in a flat bed without using bedrails?:  None Help needed moving from lying on your back to sitting on the side of a flat bed without using bedrails?: None Help needed moving to and from a bed to a chair (including a wheelchair)?: None Help needed standing up from a chair using your arms (e.g., wheelchair or bedside chair)?: None Help needed to walk in hospital room?: None Help needed climbing 3-5 steps with a railing? : A Lot 6 Click Score: 22    End of Session Equipment Utilized During Treatment: Gait belt Activity Tolerance: Patient tolerated treatment well Patient left: in bed;with call bell/phone within reach Nurse Communication: Mobility status PT Visit Diagnosis: Other abnormalities of gait and mobility (R26.89);Difficulty in walking, not elsewhere classified (R26.2);Muscle weakness (generalized) (M62.81)    Time: 8341-9622 PT Time Calculation (min) (ACUTE ONLY): 24 min   Charges:   PT Evaluation $PT Eval Low Complexity: 1 Low PT Treatments $Therapeutic Activity: 23-37 mins        Olga Coaster PT, DPT 3:31 PM,05/09/22

## 2022-05-10 LAB — URINE CULTURE: Culture: <10000 — AB

## 2022-05-13 LAB — CULTURE, BLOOD (ROUTINE X 2): Culture: NO GROWTH

## 2022-05-14 ENCOUNTER — Other Ambulatory Visit: Payer: Medicare Other | Admitting: Primary Care

## 2022-05-14 DIAGNOSIS — E1165 Type 2 diabetes mellitus with hyperglycemia: Secondary | ICD-10-CM

## 2022-05-14 DIAGNOSIS — Z515 Encounter for palliative care: Secondary | ICD-10-CM

## 2022-05-14 DIAGNOSIS — R5381 Other malaise: Secondary | ICD-10-CM

## 2022-05-14 NOTE — Progress Notes (Signed)
Therapist, nutritional Palliative Care Consult Note Telephone: [phone]  Fax: [phone]    Date of encounter: 05/14/22 1:18 PM PATIENT NAME: [name] [name] [address] [place] [address]   [phone] (home)  DOB: 10-27-1927 MRN: [id number] PRIMARY CARE PROVIDER:    Smiley Houseman, NP,  [address] [place] [address] [phone]  REFERRING PROVIDER:   Smiley Houseman, NP [address] [place],  [place] [address] [phone]  RESPONSIBLE PARTY:    Contact Information     Name Relation Home Work Mobile   Payne,Jennifer Daughter [phone]  [phone]   Edder, [place]  [phone]        I met face to face with patient in [facility]. Palliative Care was asked to follow this patient by consultation request of  Smiley Houseman, NP to address advance care planning and complex medical decision making. This is a follow up visit.                                   ASSESSMENT AND PLAN / RECOMMENDATIONS:   Advance Care Planning/Goals of Care: Goals include to maximize quality of life and symptom management. Patient/health care surrogate gave his/her permission to discuss.Our advance care planning conversation included a discussion about:     Exploration of personal, cultural or spiritual beliefs that might influence medical decisions  Exploration of goals of care in the event of a sudden injury or illness  Identification of a healthcare agent - daughter CODE STATUS: DNR  Symptom Management/Plan:  Falls: Denies, using walker in room. Currently on quarantine for covid.  Appetite: Reports good but 50% of lunch eaten. Reports pill burden, please review to reduce poly pharmacy.  Mood: States he is feeling better. Has had pacemaker battery placed, and received some blood this week. Was hospitalized for gi bleed and received IV Abx as well. Watching baseball today, follows the [employer].   Wound; Still being  monitored by home health. For wound clinic in 2 weeks. No new reports of wound decline.  Follow up Palliative Care Visit: Palliative care will continue to follow for complex medical decision making, advance care planning, and clarification of goals. Return 4-6 weeks or prn.  I spent 40 minutes providing this consultation. More than 50% of the time in this consultation was spent in counseling and care coordination.  PPS: 40%  HOSPICE ELIGIBILITY/DIAGNOSIS: TBD  Chief Complaint: debility, s/p gi bleed, anemia  HISTORY OF PRESENT ILLNESS:  [name] [name] is a 88 y.o. year old male  with debility, LE wound, anemia . Patient seen today to review palliative care needs to include medical decision making and advance care planning as appropriate.   History obtained from review of EMR, discussion with primary team, and interview with family, facility staff/caregiver and/or Mr. Cuadros.  I reviewed available labs, medications, imaging, studies and related documents from the EMR.  Records reviewed and summarized above.   ROS  General: NAD EYES: denies vision changes ENMT: endorses dysphagia Cardiovascular: denies chest pain, denies DOE Pulmonary: endorses cough, denies increased SOB Abdomen: endorses good appetite, denies constipation, endorses continence of bowel GU: denies dysuria, endorses continence of urine MSK:  denies  increased weakness,  no falls reported Skin: denies rashes or wounds Neurological: denies pain, denies insomnia Psych: Endorses positive mood  Physical Exam: Current and past weights: 143 lbs reported Constitutional: NAD General: frail appearing, thin EYES:  anicteric sclera, lids intact, no discharge  ENMT: hard of hearing, oral mucous membranes moist, dentition intact CV: S1S2, irreg, 2+ LE edema Pulmonary: upper lobes clear, expiratory wheezes in bases, no increased work of breathing, no cough, room air Abdomen: intake 70%,  soft and non tender, no ascites MSK: +  sarcopenia, moves all extremities, ambulatory with walker Skin: warm and dry, no rashes or wounds on visible skin, wrapped on LE Neuro:  + generalized weakness,  no cognitive impairment, non-anxious affect  Thank you for the opportunity to participate in the care of Mr. Pillsbury.  The palliative care team will continue to follow. Please call our office at [phone] if we can be of additional assistance.   Jason Coop, NP DNP, AGPCNP-BC  COVID-19 PATIENT SCREENING TOOL Asked and negative response unless otherwise noted:   Have you had symptoms of covid, tested positive or been in contact with someone with symptoms/positive test in the past 5-10 days?

## 2022-05-28 ENCOUNTER — Encounter: Payer: Medicare Other | Attending: Physician Assistant | Admitting: Physician Assistant

## 2022-05-28 DIAGNOSIS — Z950 Presence of cardiac pacemaker: Secondary | ICD-10-CM | POA: Insufficient documentation

## 2022-05-28 DIAGNOSIS — E11621 Type 2 diabetes mellitus with foot ulcer: Secondary | ICD-10-CM | POA: Diagnosis present

## 2022-05-28 DIAGNOSIS — I495 Sick sinus syndrome: Secondary | ICD-10-CM | POA: Diagnosis not present

## 2022-05-28 DIAGNOSIS — L89613 Pressure ulcer of right heel, stage 3: Secondary | ICD-10-CM | POA: Diagnosis not present

## 2022-05-28 DIAGNOSIS — L89623 Pressure ulcer of left heel, stage 3: Secondary | ICD-10-CM | POA: Insufficient documentation

## 2022-05-28 DIAGNOSIS — I480 Paroxysmal atrial fibrillation: Secondary | ICD-10-CM | POA: Diagnosis not present

## 2022-05-28 NOTE — Progress Notes (Addendum)
RATHANA, VIVEROS ([id number]) Visit Report for 05/28/2022 Allergy List Details Patient Name: [name] [name], [name] [name] Date of Service: 05/28/2022 1:00 PM Medical Record Number: [id number] Patient Account Number: [id number] Date of Birth/Sex: 08-23-1928 (88 y.o. Male) Treating RN: Huel Coventry Primary Care Margrit Minner: Ilsa Iha Other Clinician: Referring Maeghan Canny: Ilsa Iha Treating Kristan Brummitt/Extender: Rowan Blase in Treatment: 0 Allergies Active Allergies Statins-HMG-CoA Reductase Inhibitors Reaction: palpations aspirin Avapro ACE Inhibitors Allergy Notes Electronic Signature(s) Signed: 05/28/2022 2:43:29 PM By: Elliot Gurney, BSN, RN, CWS, Kim RN, BSN Entered By: Elliot Gurney, BSN, RN, CWS, Kim on 05/28/2022 14:43:28 [place], Peterson Lombard ([id number]) -------------------------------------------------------------------------------- Arrival Information Details Patient Name: [name] [name] Date of Service: 05/28/2022 1:00 PM Medical Record Number: [id number] Patient Account Number: [id number] Date of Birth/Sex: 09-Jul-1928 (88 y.o. Male) Treating RN: Huel Coventry Primary Care Avaree Gilberti: Ilsa Iha Other Clinician: Referring Florenda Watt: Ilsa Iha Treating Jayan Raymundo/Extender: Rowan Blase in Treatment: 0 Visit Information Patient Arrived: Ambulatory Arrival Time: 13:02 Accompanied By: caregiver Transfer Assistance: Manual Patient Identification Verified: Yes Secondary Verification Process Completed: Yes Patient Requires Transmission-Based No Precautions: Patient Has Alerts: Yes Patient Alerts: Patient on Blood Thinner Type II Diabetic Eliquis Electronic Signature(s) Signed: 05/28/2022 5:08:52 PM By: Elliot Gurney, BSN, RN, CWS, Kim RN, BSN Entered By: Elliot Gurney, BSN, RN, CWS, Kim on 05/28/2022 13:13:17 [place], Peterson Lombard ([id number]) -------------------------------------------------------------------------------- Clinic Level of Care Assessment Details Patient Name: [name] [name] Date of Service: 05/28/2022 1:00 PM Medical  Record Number: [id number] Patient Account Number: [id number] Date of Birth/Sex: 06-07-1928 (87 y.o. Male) Treating RN: Huel Coventry Primary Care Darolyn Double: Ilsa Iha Other Clinician: Referring Trevante Tennell: Ilsa Iha Treating Arvid Marengo/Extender: Rowan Blase in Treatment: 0 Clinic Level of Care Assessment Items TOOL 1 Quantity Score []  - Use when EandM and Procedure is performed on INITIAL visit 0 ASSESSMENTS - Nursing Assessment / Reassessment X - General Physical Exam (combine w/ comprehensive assessment (listed just below) when performed on new 1 20 pt. evals) X- 1 25 Comprehensive Assessment (HX, ROS, Risk Assessments, Wounds Hx, etc.) ASSESSMENTS - Wound and Skin Assessment / Reassessment []  - Dermatologic / Skin Assessment (not related to wound area) 0 ASSESSMENTS - Ostomy and/or Continence Assessment and Care []  - Incontinence Assessment and Management 0 []  - 0 Ostomy Care Assessment and Management (repouching, etc.) PROCESS - Coordination of Care []  - Simple Patient / Family Education for ongoing care 0 X- 1 20 Complex (extensive) Patient / Family Education for ongoing care X- 1 10 Staff obtains Consents, Records, Test Results / Process Orders []  - 0 Staff telephones HHA, Nursing Homes / Clarify orders / etc []  - 0 Routine Transfer to another Facility (non-emergent condition) []  - 0 Routine Hospital Admission (non-emergent condition) X- 1 15 New Admissions / / Ordering NPWT, Apligraf, etc. []  - 0 Emergency Hospital Admission (emergent condition) PROCESS - Special Needs []  - Pediatric / Minor Patient Management 0 []  - 0 Isolation Patient Management []  - 0 Hearing / Language / Visual special needs []  - 0 Assessment of Community assistance (transportation, D/C planning, etc.) []  - 0 Additional assistance / Altered mentation []  - 0 Support Surface(s) Assessment (bed, cushion, seat, etc.) INTERVENTIONS - Miscellaneous []  - External ear  exam 0 []  - 0 Patient Transfer (multiple staff / / Similar devices) []  - 0 Simple Staple / Suture removal (25 or less) []  - 0 Complex Staple / Suture removal (26 or more) []  - 0 Hypo/Hyperglycemic Management (do not check if billed separately) []  - 0 Ankle / Brachial Index (ABI) - do not check if  billed separately Has the patient been seen at the hospital within the last three years: Yes Total Score: 90 Level Of Care: New/Established - Level 3 Archibald, Gopal ([id number]) Electronic Signature(s) Signed: 05/28/2022 5:08:52 PM By: Gretta Cool, BSN, RN, CWS, Kim RN, BSN Entered By: Gretta Cool, BSN, RN, CWS, Kim on 05/28/2022 14:31:26 [place], Travian ([id number]) -------------------------------------------------------------------------------- Lower Extremity Assessment Details Patient Name: [name] [name] Date of Service: 05/28/2022 1:00 PM Medical Record Number: [id number] Patient Account Number: [id number] Date of Birth/Sex: 1928/03/27 (88 y.o. Male) Treating RN: Cornell Barman Primary Care Jamie Belger: Alver Fisher Other Clinician: Referring Vegas Fritze: Alver Fisher Treating Elvena Oyer/Extender: Skipper Cliche in Treatment: 0 Edema Assessment Assessed: [Left: No] [Right: No] [Left: Edema] [Right: :] Calf Left: Right: Point of Measurement: 30 cm From Medial Instep 26.5 cm 26.5 cm Ankle Left: Right: Point of Measurement: 10 cm From Medial Instep 22 cm 22 cm Vascular Assessment Pulses: Dorsalis Pedis Palpable: [Left:No] [Right:No] Doppler Audible: [Left:Inaudible] [Right:Inaudible] Posterior Tibial Palpable: [Left:No Inaudible] [Right:No Inaudible] Notes Unable to palpate or hear patient's pulses. Feet are cool to touch. Cap refill >3. Electronic Signature(s) Signed: 05/28/2022 5:08:52 PM By: Gretta Cool, BSN, RN, CWS, Kim RN, BSN Entered By: Gretta Cool, BSN, RN, CWS, Kim on 05/28/2022 14:01:47 [place], [place]  ([id number]) -------------------------------------------------------------------------------- Multi Wound Chart Details Patient Name: [name] [name] Date of Service: 05/28/2022 1:00 PM Medical Record Number: [id number] Patient Account Number: [id number] Date of Birth/Sex: [birth year] (88 y.o. Male) Treating RN: Cornell Barman Primary Care Jayceon Troy: Alver Fisher Other Clinician: Referring Ilamae Geng: Alver Fisher Treating Auriah Hollings/Extender: Skipper Cliche in Treatment: 0 Vital Signs Height(in): Pulse(bpm): 35 Weight(lbs): Blood Pressure(mmHg): 145/48 Body Mass Index(BMI): Temperature(F): 97.8 Respiratory Rate(breaths/min): 16 Photos: [N/A:N/A] Wound Location: Right Calcaneus Right Calcaneus N/A Wounding Event: Gradually Appeared Gradually Appeared N/A Primary Etiology: Pressure Ulcer Pressure Ulcer N/A Secondary Etiology: Diabetic Wound/Ulcer of the Lower Diabetic Wound/Ulcer of the Lower N/A Extremity Extremity Comorbid History: Anemia, Arrhythmia, Hypertension, Anemia, Arrhythmia, Hypertension, N/A Peripheral Arterial Disease, Type II Peripheral Arterial Disease, Type II Diabetes, History of pressure Diabetes, History of pressure wounds, Osteomyelitis wounds, Osteomyelitis Date Acquired: 01/15/2022 01/15/2022 N/A Weeks of Treatment: 0 0 N/A Wound Status: Open Open N/A Wound Recurrence: No No N/A Measurements L x W x D (cm) 5x2x0.3 1x0.9x0.3 N/A Area (cm) : 7.854 0.707 N/A Volume (cm) : 2.356 0.212 N/A % Reduction in Area: 0.00% N/A N/A % Reduction in Volume: 0.00% N/A N/A Starting Position 1 (o'clock): 3 Ending Position 1 (o'clock): 8 Maximum Distance 1 (cm): 1.2 Undermining: Yes No N/A Classification: Category/Stage III Category/Stage III N/A Exudate Amount: Medium Medium N/A Exudate Type: Serous Serous N/A Exudate Color: amber amber N/A Wound Margin: Flat and Intact Fibrotic scar, thickened scar N/A Granulation Amount: Small (1-33%) None Present (0%) N/A Granulation  Quality: Red N/A N/A Necrotic Amount: Large (67-100%) Large (67-100%) N/A Exposed Structures: Fat Layer (Subcutaneous Tissue): Fat Layer (Subcutaneous Tissue): N/A Yes Yes Fascia: No Fascia: No Tendon: No Tendon: No Muscle: No Muscle: No Joint: No Joint: No Bone: No Bone: No Epithelialization: None None N/A Treatment Notes ELAD, BUR ([id number]) Electronic Signature(s) Signed: 05/28/2022 5:08:52 PM By: Gretta Cool, BSN, RN, CWS, Kim RN, BSN Entered By: Gretta Cool, BSN, RN, CWS, Kim on 05/28/2022 14:04:56 [place], Sonia Baller ([id number]) -------------------------------------------------------------------------------- [place] Details Patient Name: [name] [name] Date of Service: 05/28/2022 1:00 PM Medical Record Number: [id number] Patient Account Number: [id number] Date of Birth/Sex: Feb 17, 1928 (88 y.o. Male) Treating RN: Cornell Barman Primary Care Lara Palinkas: Alver Fisher Other Clinician: Referring Contrell Ballentine: Alver Fisher Treating Byrd Terrero/Extender: Skipper Cliche in  Treatment: 0 Active Inactive Electronic Signature(s) Signed: 06/20/2022 1:58:44 PM By: Gretta Cool, BSN, RN, CWS, Kim RN, BSN Previous Signature: 05/28/2022 5:08:52 PM Version By: Gretta Cool, BSN, RN, CWS, Kim RN, BSN Entered By: Gretta Cool, BSN, RN, CWS, Kim on 06/20/2022 13:58:44 [place], [place] ([id number]) -------------------------------------------------------------------------------- Pain Assessment Details Patient Name: [name] [name] Date of Service: 05/28/2022 1:00 PM Medical Record Number: [id number] Patient Account Number: [id number] Date of Birth/Sex: [birth year] (88 y.o. Male) Treating RN: Cornell Barman Primary Care Patric Vanpelt: Alver Fisher Other Clinician: Referring Ryot Burrous: Alver Fisher Treating Nour Rodrigues/Extender: Skipper Cliche in Treatment: 0 Active Problems Location of Pain Severity and Description of Pain Patient Has Paino No Site Locations Pain Management and Medication Current Pain  Management: Notes Patient denies pain at this time. Electronic Signature(s) Signed: 05/28/2022 5:08:52 PM By: Gretta Cool, BSN, RN, CWS, Kim RN, BSN Entered By: Gretta Cool, BSN, RN, CWS, Kim on 05/28/2022 13:13:41 [place], Sonia Baller ([id number]) -------------------------------------------------------------------------------- Patient/Caregiver Education Details Patient Name: [name] [name] Date of Service: 05/28/2022 1:00 PM Medical Record Number: [id number] Patient Account Number: [id number] Date of Birth/Gender: 09/17/1928 (88 y.o. Male) Treating RN: Cornell Barman Primary Care Physician: Alver Fisher Other Clinician: Referring Physician: Alver Fisher Treating Physician/Extender: Skipper Cliche in Treatment: 0 Education Assessment Education Provided To: Patient Education Topics Provided Infection: Handouts: Infection Prevention and Management, Other: gentamycin to right wound bed Methods: Demonstration, Explain/Verbal Responses: State content correctly Welcome [facility]: Wound Debridement: Handouts: Wound Debridement, Other: santyl to both wounds Methods: Explain/Verbal Responses: State content correctly Electronic Signature(s) Signed: 05/28/2022 5:08:52 PM By: Gretta Cool, BSN, RN, CWS, Kim RN, BSN Entered By: Gretta Cool, BSN, RN, CWS, Kim on 05/28/2022 14:32:28 [place], Sonia Baller ([id number]) -------------------------------------------------------------------------------- Wound Assessment Details Patient Name: [name] [name] Date of Service: 05/28/2022 1:00 PM Medical Record Number: [id number] Patient Account Number: [id number] Date of Birth/Sex: [birth year] (88 y.o. Male) Treating RN: Cornell Barman Primary Care Kieanna Rollo: Alver Fisher Other Clinician: Referring Krystle Polcyn: Alver Fisher Treating Zi Sek/Extender: Skipper Cliche in Treatment: 0 Wound Status Wound Number: 1 Primary Pressure Ulcer Etiology: Wound Location: Right Calcaneus Secondary Diabetic Wound/Ulcer of the Lower  Extremity Wounding Event: Gradually Appeared Etiology: Date Acquired: 01/15/2022 Wound Open Weeks Of Treatment: 0 Status: Clustered Wound: No Comorbid Anemia, Arrhythmia, Hypertension, Peripheral Arterial History: Disease, Type II Diabetes, History of pressure wounds, Osteomyelitis Photos Wound Measurements Length: (cm) 5 Width: (cm) 2 Depth: (cm) 0.3 Area: (cm) 7.854 Volume: (cm) 2.356 % Reduction in Area: 0% % Reduction in Volume: 0% Epithelialization: None Tunneling: No Undermining: Yes Starting Position (o'clock): 3 Ending Position (o'clock): 8 Maximum Distance: (cm) 1.2 Wound Description Classification: Category/Stage III Wound Margin: Flat and Intact Exudate Amount: Medium Exudate Type: Serous Exudate Color: amber Foul Odor After Cleansing: No Slough/Fibrino Yes Wound Bed Granulation Amount: Small (1-33%) Exposed Structure Granulation Quality: Red Fascia Exposed: No Necrotic Amount: Large (67-100%) Fat Layer (Subcutaneous Tissue) Exposed: Yes Necrotic Quality: Adherent Slough Tendon Exposed: No Muscle Exposed: No Joint Exposed: No Bone Exposed: No Electronic Signature(s) Signed: 05/28/2022 5:08:52 PM By: Gretta Cool, BSN, RN, CWS, Kim RN, BSN [place], [place] ([id number]) Entered By: Gretta Cool, BSN, RN, CWS, Kim on 05/28/2022 13:45:03 [place], [place] ([id number]) -------------------------------------------------------------------------------- Wound Assessment Details Patient Name: [name] [name] Date of Service: 05/28/2022 1:00 PM Medical Record Number: [id number] Patient Account Number: [id number] Date of Birth/Sex: January 01, 1928 (88 y.o. Male) Treating RN: Cornell Barman Primary Care Jaxsin Bottomley: Alver Fisher Other Clinician: Referring Menaal Russum: Alver Fisher Treating Thinh Cuccaro/Extender: Skipper Cliche in Treatment: 0 Wound Status Wound Number: 2 Primary Pressure Ulcer Etiology: Wound Location: Right Calcaneus Secondary Diabetic  Wound/Ulcer of the Lower Extremity Wounding  Event: Gradually Appeared Etiology: Date Acquired: 01/15/2022 Wound Open Weeks Of Treatment: 0 Status: Clustered Wound: No Comorbid Anemia, Arrhythmia, Hypertension, Peripheral Arterial History: Disease, Type II Diabetes, History of pressure wounds, Osteomyelitis Photos Wound Measurements Length: (cm) 1 Width: (cm) 0.9 Depth: (cm) 0.3 Area: (cm) 0.707 Volume: (cm) 0.212 % Reduction in Area: % Reduction in Volume: Epithelialization: None Tunneling: No Undermining: No Wound Description Classification: Category/Stage III Wound Margin: Fibrotic scar, thickened scar Exudate Amount: Medium Exudate Type: Serous Exudate Color: amber Foul Odor After Cleansing: No Slough/Fibrino Yes Wound Bed Granulation Amount: None Present (0%) Exposed Structure Necrotic Amount: Large (67-100%) Fascia Exposed: No Necrotic Quality: Adherent Slough Fat Layer (Subcutaneous Tissue) Exposed: Yes Tendon Exposed: No Muscle Exposed: No Joint Exposed: No Bone Exposed: No Electronic Signature(s) Signed: 05/28/2022 5:08:52 PM By: Elliot Gurney, BSN, RN, CWS, Kim RN, BSN Entered By: Elliot Gurney, BSN, RN, CWS, Kim on 05/28/2022 13:44:51 [place]ra[place], Marquies ([id number]) -------------------------------------------------------------------------------- Vitals Details Patient Name: [name] [name] Date of Service: 05/28/2022 1:00 PM Medical Record Number: [id number] Patient Account Number: [id number] Date of Birth/Sex: 09/07/1928 (88 y.o. Male) Treating RN: Huel Coventry Primary Care Owens Hara: Ilsa Iha Other Clinician: Referring Mahum Betten: Ilsa Iha Treating Taneah Masri/Extender: Rowan Blase in Treatment: 0 Vital Signs Time Taken: 13:12 Temperature (F): 97.8 Unable to obtain height and weight: Medical Reason Pulse (bpm): 35 Respiratory Rate (breaths/min): 16 Blood Pressure (mmHg): 145/48 Reference Range: 80 - 120 mg / dl Notes Patient has pace maker. Rate should be at 60. Had PA come into room during admission to  check behind RN on pulse. Pulse 36 with machine, 35 manual by nurse, 35 when checked by PA. PA placed call into patients cardiologist. Waiting on return call for next steps. Patient states the batteries were changed recently. Electronic Signature(s) Signed: 05/28/2022 2:36:41 PM By: Elliot Gurney, BSN, RN, CWS, Kim RN, BSN Entered By: Elliot Gurney, BSN, RN, CWS, Kim on 05/28/2022 14:36:41

## 2022-05-28 NOTE — Progress Notes (Signed)
KAO, CONRY ([id number]) Visit Report for 05/28/2022 Abuse Risk Screen Details Patient Name: [name] [name], [name] [name] Date of Service: 05/28/2022 1:00 PM Medical Record Number: [id number] Patient Account Number: [id number] Date of Birth/Sex: June 30, 1928 (88 y.o. M) Treating RN: Huel Coventry Primary Care Ly Wass: Ilsa Iha Other Clinician: Referring Keyuna Cuthrell: Ilsa Iha Treating Jarmal Lewelling/Extender: Rowan Blase in Treatment: 0 Abuse Risk Screen Items Answer ABUSE RISK SCREEN: Has anyone close to you tried to hurt or harm you recentlyo No Do you feel uncomfortable with anyone in your familyo No Has anyone forced you do things that you didnot want to doo No Electronic Signature(s) Signed: 05/28/2022 5:08:52 PM By: Elliot Gurney, BSN, RN, CWS, Kim RN, BSN Entered By: Elliot Gurney, BSN, RN, CWS, Kim on 05/28/2022 13:26:00 [place], Peterson Lombard ([id number]) -------------------------------------------------------------------------------- Activities of Daily Living Details Patient Name: [name] [name] Date of Service: 05/28/2022 1:00 PM Medical Record Number: [id number] Patient Account Number: [id number] Date of Birth/Sex: 12-Feb-1928 (88 y.o. M) Treating RN: Huel Coventry Primary Care Sharyon Peitz: Ilsa Iha Other Clinician: Referring Ruven Corradi: Ilsa Iha Treating Amarii Bordas/Extender: Rowan Blase in Treatment: 0 Activities of Daily Living Items Answer Activities of Daily Living (Please select one for each item) Drive Automobile Not Able Take Medications Need Assistance Use Telephone Completely Able Care for Appearance Need Assistance Use Toilet Completely Able Bath / Shower Need Assistance Dress Self Need Assistance Feed Self Need Assistance Walk Need Assistance Get In / Out Bed Need Assistance Housework Need Assistance Prepare Meals Need Assistance Handle Money Need Assistance Shop for Self Need Assistance Electronic Signature(s) Signed: 05/28/2022 5:08:52 PM By: Elliot Gurney, BSN, RN, CWS, Kim RN, BSN Entered  By: Elliot Gurney, BSN, RN, CWS, Kim on 05/28/2022 13:26:29 [name] [name] ([id number]) -------------------------------------------------------------------------------- Education Screening Details Patient Name: [name] [name] Date of Service: 05/28/2022 1:00 PM Medical Record Number: [id number] Patient Account Number: [id number] Date of Birth/Sex: November 29, 1927 (88 y.o. M) Treating RN: Huel Coventry Primary Care Renai Lopata: Ilsa Iha Other Clinician: Referring Leimomi Zervas: Ilsa Iha Treating Arti Trang/Extender: Rowan Blase in Treatment: 0 Primary Learner Assessed: Patient Learning Preferences/Education Level/Primary Language Learning Preference: Explanation Highest Education Level: College or Above Preferred Language: English Cognitive Barrier Language Barrier: No Translator Needed: No Memory Deficit: No Emotional Barrier: No Cultural/Religious Beliefs Affecting Medical Care: No Motivation Anxiety Level: Calm Cooperation: Cooperative Education Importance: Acknowledges Need Interest in Health Problems: Asks Questions Perception: Coherent Willingness to Engage in Self-Management High Activities: Readiness to Engage in Self-Management High Activities: Electronic Signature(s) Signed: 05/28/2022 5:08:52 PM By: Elliot Gurney, BSN, RN, CWS, Kim RN, BSN Entered By: Elliot Gurney, BSN, RN, CWS, Kim on 05/28/2022 13:26:50 [place], Peterson Lombard ([id number]) -------------------------------------------------------------------------------- Fall Risk Assessment Details Patient Name: [name] [name] Date of Service: 05/28/2022 1:00 PM Medical Record Number: [id number] Patient Account Number: [id number] Date of Birth/Sex: [birth year] (88 y.o. M) Treating RN: Huel Coventry Primary Care Rosalva Neary: Ilsa Iha Other Clinician: Referring Camala Talwar: Ilsa Iha Treating Makyle Eslick/Extender: Rowan Blase in Treatment: 0 Fall Risk Assessment Items Have you had 2 or more falls in the last 12 monthso 0 Yes Have you had any fall that  resulted in injury in the last 12 monthso 0 No FALLS RISK SCREEN History of falling - immediate or within 3 months 25 Yes Secondary diagnosis (Do you have 2 or more medical diagnoseso) 0 No Ambulatory aid None/bed rest/wheelchair/nurse 0 Yes Crutches/cane/walker 0 No Furniture 0 No Intravenous therapy Access/Saline/Heparin Lock 0 No Gait/Transferring Normal/ bed rest/ wheelchair 0 Yes Weak (short steps with or without shuffle, stooped but able to lift head while walking, may 0 No seek support from furniture)  Impaired (short steps with shuffle, may have difficulty arising from chair, head down, impaired 0 No balance) Mental Status Oriented to own ability 0 Yes Electronic Signature(s) Signed: 05/28/2022 5:08:52 PM By: Elliot Gurney, BSN, RN, CWS, Kim RN, BSN Entered By: Elliot Gurney, BSN, RN, CWS, Kim on 05/28/2022 13:27:19 [place], Aceyn ([id number]) -------------------------------------------------------------------------------- Foot Assessment Details Patient Name: [name] [name] Date of Service: 05/28/2022 1:00 PM Medical Record Number: [id number] Patient Account Number: [id number] Date of Birth/Sex: 1928/01/14 (88 y.o. M) Treating RN: Huel Coventry Primary Care Krystalle Pilkington: Ilsa Iha Other Clinician: Referring Kashif Pooler: Ilsa Iha Treating Cejay Cambre/Extender: Rowan Blase in Treatment: 0 Foot Assessment Items Site Locations + = Sensation present, - = Sensation absent, C = Callus, U = Ulcer R = Redness, W = Warmth, M = Maceration, PU = Pre-ulcerative lesion F = Fissure, S = Swelling, D = Dryness Assessment Right: Left: Other Deformity: No No Prior Foot Ulcer: No No Prior Amputation: No No Charcot Joint: No No Ambulatory Status: Ambulatory With Help Assistance Device: Walker Gait: Surveyor, mining) Signed: 05/28/2022 5:08:52 PM By: Elliot Gurney, BSN, RN, CWS, Kim RN, BSN Entered By: Elliot Gurney, BSN, RN, CWS, Kim on 05/28/2022 13:40:54 Schorsch, Tyberius  ([id number]) -------------------------------------------------------------------------------- Nutrition Risk Screening Details Patient Name: [name] [name] Date of Service: 05/28/2022 1:00 PM Medical Record Number: [id number] Patient Account Number: [id number] Date of Birth/Sex: 10-Nov-1927 (88 y.o. M) Treating RN: Huel Coventry Primary Care Amadeus Oyama: Ilsa Iha Other Clinician: Referring Jo Cerone: Ilsa Iha Treating Verbie Babic/Extender: Rowan Blase in Treatment: 0 Height (in): Weight (lbs): Body Mass Index (BMI): Nutrition Risk Screening Items Score Screening NUTRITION RISK SCREEN: I have an illness or condition that made me change the kind and/or amount of food I eat 0 No I eat fewer than two meals per day 0 No I eat few fruits and vegetables, or milk products 0 No I have three or more drinks of beer, liquor or wine almost every day 0 No I have tooth or mouth problems that make it hard for me to eat 0 No I don't always have enough money to buy the food I need 0 No I eat alone most of the time 0 No I take three or more different prescribed or over-the-counter drugs a day 1 Yes Without wanting to, I have lost or gained 10 pounds in the last six months 0 No I am not always physically able to shop, cook and/or feed myself 0 No Nutrition Protocols Good Risk Protocol Moderate Risk Protocol 0 Provide education on nutrition High Risk Proctocol Risk Level: Good Risk Score: 1 Electronic Signature(s) Signed: 05/28/2022 5:08:52 PM By: Elliot Gurney, BSN, RN, CWS, Kim RN, BSN Entered By: Elliot Gurney, BSN, RN, CWS, Kim on 05/28/2022 13:27:39

## 2022-05-29 NOTE — Progress Notes (Signed)
[name] [name], [name] [name] ([id number]) Visit Report for 05/28/2022 Chief Complaint Document Details Patient Name: [name] [name], [name] [name] Date of Service: 05/28/2022 1:00 PM Medical Record Number: [id number] Patient Account Number: [id number] Date of Birth/Sex: Aug 14, 1928 (88 y.o. Male) Treating RN: Cornell Barman Primary Care Provider: Alver Fisher Other Clinician: Referring Provider: Alver Fisher Treating Provider/Extender: Skipper Cliche in Treatment: 0 Information Obtained from: Patient Chief Complaint Bilateral Heel Pressure Ulcers Electronic Signature(s) Signed: 05/28/2022 2:00:04 PM By: Worthy Keeler PA-C Entered By: Worthy Keeler on 05/28/2022 14:00:04 [name] [name] ([id number]) -------------------------------------------------------------------------------- Debridement Details Patient Name: [name] [name] Date of Service: 05/28/2022 1:00 PM Medical Record Number: [id number] Patient Account Number: [id number] Date of Birth/Sex: 07/18/1928 (88 y.o. Male) Treating RN: Cornell Barman Primary Care Provider: Alver Fisher Other Clinician: Referring Provider: Alver Fisher Treating Provider/Extender: Skipper Cliche in Treatment: 0 Debridement Performed for Wound #1 Right Calcaneus Assessment: Performed By: Physician Tommie Sams., PA-C Debridement Type: Debridement Severity of Tissue Pre Debridement: Fat layer exposed Level of Consciousness (Pre- Awake and Alert procedure): Pre-procedure Verification/Time Out Yes - 14:05 Taken: Total Area Debrided (L x W): 2 (cm) x 1 (cm) = 2 (cm) Tissue and other material Non-Viable, Skin: Dermis debrided: Level: Skin/Dermis Debridement Description: Selective/Open Wound Instrument: Curette Bleeding: None Response to Treatment: Procedure was tolerated well Level of Consciousness (Post- Awake and Alert procedure): Post Debridement Measurements of Total Wound Length: (cm) 4 Stage: Category/Stage III Width: (cm) 2 Depth: (cm) 0.3 Volume: (cm)  1.885 Character of Wound/Ulcer Post Debridement: Stable Severity of Tissue Post Debridement: Fat layer exposed Post Procedure Diagnosis Same as Pre-procedure Electronic Signature(s) Signed: 05/28/2022 5:08:52 PM By: Gretta Cool, BSN, RN, CWS, Kim RN, BSN Signed: 05/29/2022 5:16:25 PM By: Worthy Keeler PA-C Entered By: Gretta Cool, BSN, RN, CWS, Kim on 05/28/2022 14:07:19 [name] [name] ([id number]) -------------------------------------------------------------------------------- Debridement Details Patient Name: [name] [name] Date of Service: 05/28/2022 1:00 PM Medical Record Number: [id number] Patient Account Number: [id number] Date of Birth/Sex: 11/24/1927 (88 y.o. Male) Treating RN: Cornell Barman Primary Care Provider: Alver Fisher Other Clinician: Referring Provider: Alver Fisher Treating Provider/Extender: Skipper Cliche in Treatment: 0 Debridement Performed for Wound #2 Left Calcaneus Assessment: Performed By: Physician Tommie Sams., PA-C Debridement Type: Chemical/Enzymatic/Mechanical Agent Used: Santyl Severity of Tissue Pre Debridement: Fat layer exposed Level of Consciousness (Pre- Awake and Alert procedure): Pre-procedure Verification/Time Out Yes - 14:05 Taken: Instrument: Other : santyl Bleeding: None Response to Treatment: Procedure was tolerated well Level of Consciousness (Post- Awake and Alert procedure): Post Debridement Measurements of Total Wound Length: (cm) 1 Stage: Category/Stage III Width: (cm) 0.9 Depth: (cm) 0.3 Volume: (cm) 0.212 Character of Wound/Ulcer Post Debridement: Stable Severity of Tissue Post Debridement: Fat layer exposed Post Procedure Diagnosis Same as Pre-procedure Electronic Signature(s) Signed: 05/28/2022 2:30:57 PM By: Gretta Cool, BSN, RN, CWS, Kim RN, BSN Signed: 05/29/2022 5:16:25 PM By: Worthy Keeler PA-C Entered By: Gretta Cool, BSN, RN, CWS, Kim on 05/28/2022 14:30:57 [name] [name]  ([id number]) -------------------------------------------------------------------------------- HPI Details Patient Name: [name] [name] Date of Service: 05/28/2022 1:00 PM Medical Record Number: [id number] Patient Account Number: [id number] Date of Birth/Sex: [birth year] (88 y.o. Male) Treating RN: Cornell Barman Primary Care Provider: Alver Fisher Other Clinician: Referring Provider: Alver Fisher Treating Provider/Extender: Skipper Cliche in Treatment: 0 History of Present Illness HPI Description: 05-28-2022 patient presents today for initial inspection here in our clinic concerning wounds that he has had since around the beginning of May based on what we see. The right is larger than the left though both seem to be doing  decently well. He does have somewhat poor perfusion based on what were seen in fact upon evaluation today his pulse despite the fact he has a pacemaker which is post to pacing at 60 bpm he was only running around 35-36 in fact listen to him for 1 full minute with a stethoscope and was only able to get 36 on the count. The monitor which was electronic actually was at 35. With that being said the patient unfortunately is seeming to have some issues with his pacemaker and we did put in a call to [facility]. The patient was actually here for a couple of hours while we were trying to get in touch with him and see what the recommendation was going to be in the interim while he was here we actually ended up getting his pulse back to a normal range as far as the reading was concerned it was about that time that they finally called back after roughly an hour to an hour and a half from what I put in the first call to [facility]. They then recommended that he go back home and hooked up to his monitor they are where they can transmit the data back to the center where they can see what was going on with his pacemaker. In regard to the heels they have actually been using Santyl which I think is actually a  very good option. Patient does have a history of diabetes mellitus type 2, bradycardia, atrial fibrillation, sick sinus syndrome, and a cardiac pacemaker. Electronic Signature(s) Signed: 05/28/2022 4:48:26 PM By: Worthy Keeler PA-C Entered By: Worthy Keeler on 05/28/2022 16:48:25 Sandell, Davaun ([id number]) -------------------------------------------------------------------------------- Physical Exam Details Patient Name: [name] [name] Date of Service: 05/28/2022 1:00 PM Medical Record Number: [id number] Patient Account Number: [id number] Date of Birth/Sex: [birth year] (88 y.o. Male) Treating RN: Cornell Barman Primary Care Provider: Alver Fisher Other Clinician: Referring Provider: Alver Fisher Treating Provider/Extender: Skipper Cliche in Treatment: 0 Constitutional patient is hypertensive.. pulse regular and within target range for patient.Marland Kitchen respirations regular, non-labored and within target range for patient.Marland Kitchen temperature within target range for patient.. Well-nourished and well-hydrated in no acute distress. Eyes conjunctiva clear no eyelid edema noted. pupils equal round and reactive to light and accommodation. Ears, Nose, Mouth, and Throat no gross abnormality of ear auricles or external auditory canals. normal hearing noted during conversation. mucus membranes moist. Respiratory normal breathing without difficulty. Cardiovascular Absent posterior tibial and dorsalis pedis pulses bilateral lower extremities. trace pitting edema of the bilateral lower extremities. Musculoskeletal Patient unable to walk without assistance. Psychiatric this patient is able to make decisions and demonstrates good insight into disease process. Alert and Oriented x 3. pleasant and cooperative. Notes Upon inspection patient's wound bed actually showed signs of fairly good granulation there was some slough and biofilm buildup considering the poor perfusion with a heart rate at 36 in the clinic today I  did not want to perform any type of debridement at this point I am not really sure how well he is perfusing to his feet his capillary refill was about 8 seconds which was extremely slow. He does not seem to be symptomatic at all despite the low pulse rate but nonetheless this is still going to be an ongoing issue. He needs to have this checked as his pacemaker should not really be doing this. Electronic Signature(s) Signed: 05/28/2022 4:49:21 PM By: Worthy Keeler PA-C Entered By: Worthy Keeler on 05/28/2022 16:49:21 Vaughn, Elo ([id number]) -------------------------------------------------------------------------------- Physician Orders Details Patient  Name: KENNEITH, MANDERVILLE Date of Service: 05/28/2022 1:00 PM Medical Record Number: [id number] Patient Account Number: [id number] Date of Birth/Sex: [birth year] (88 y.o. Male) Treating RN: Cornell Barman Primary Care Provider: Alver Fisher Other Clinician: Referring Provider: Alver Fisher Treating Provider/Extender: Skipper Cliche in Treatment: 0 Verbal / Phone Orders: No Diagnosis Coding ICD-10 Coding Code Description L89.613 Pressure ulcer of right heel, stage 3 L89.623 Pressure ulcer of left heel, stage 3 E11.621 Type 2 diabetes mellitus with foot ulcer R00.1 Bradycardia, unspecified I48.0 Paroxysmal atrial fibrillation I49.5 Sick sinus syndrome Z95.0 Presence of cardiac pacemaker Follow-up Appointments Wound #1 Right Calcaneus o Return Appointment in 2 weeks. Wound #2 Left Calcaneus o Return Appointment in 2 weeks. [facility] for wound care. May utilize formulary equivalent dressing for wound treatment orders unless otherwise specified. Home Health Nurse may visit PRN to address patientos wound care needs. o Scheduled days for dressing changes to be completed; exception, patient has scheduled wound care visit that day. o **Please direct any NON-WOUND related issues/requests for orders to patient's  Primary Care Physician. **If current dressing causes regression in wound condition, may D/C ordered dressing product/s and apply Normal Saline Moist Dressing daily until next [facility] or Other MD appointment. **Notify Wound Healing Center of regression in wound condition at [phone]. Bathing/ Shower/ Hygiene o Wash wounds with antibacterial soap and water. Off-Loading o Other: Wound Treatment Wound #1 - Calcaneus Wound Laterality: Right Topical: Santyl Collagenase Ointment, 30 (gm), tube 1 x Per Day/30 Days Discharge Instructions: apply nickel thick to wound bed only Primary Dressing: Gauze 1 x Per Day/30 Days Discharge Instructions: As directed: dry, moistened with saline or moistened with Dakins Solution Secondary Dressing: Kerlix 4.5 x 4.1 (in/yd) 1 x Per Day/30 Days Discharge Instructions: Apply Kerlix 4.5 x 4.1 (in/yd) as instructed Secured With: Medipore Tape - 80M Medipore H Soft Cloth Surgical Tape, 2x2 (in/yd) 1 x Per Day/30 Days Wound #2 - Calcaneus Wound Laterality: Left Topical: Gentamicin (Home Health) 1 x Per Day/30 Days Discharge Instructions: Apply as directed by provider. Topical: Santyl Collagenase Ointment, 30 (gm), tube 1 x Per Day/30 Days Discharge Instructions: apply nickel thick to wound bed only Arai, Kengo ([id number]) Primary Dressing: Gauze 1 x Per Day/30 Days Discharge Instructions: As directed: dry, moistened with saline or moistened with Dakins Solution Secondary Dressing: Kerlix 4.5 x 4.1 (in/yd) 1 x Per Day/30 Days Discharge Instructions: Apply Kerlix 4.5 x 4.1 (in/yd) as instructed Secured With: Medipore Tape - 80M Medipore H Soft Cloth Surgical Tape, 2x2 (in/yd) 1 x Per Day/30 Days Electronic Signature(s) Signed: 05/28/2022 5:08:52 PM By: Gretta Cool, BSN, RN, CWS, Kim RN, BSN Signed: 05/29/2022 5:16:25 PM By: Worthy Keeler PA-C Entered By: Gretta Cool, BSN, RN, CWS, Kim on 05/28/2022 14:28:38 Obert, Sonia Baller  ([id number]) -------------------------------------------------------------------------------- Problem List Details Patient Name: [name] [name] Date of Service: 05/28/2022 1:00 PM Medical Record Number: [id number] Patient Account Number: [id number] Date of Birth/Sex: 02-Jul-1928 (88 y.o. Male) Treating RN: Cornell Barman Primary Care Provider: Alver Fisher Other Clinician: Referring Provider: Alver Fisher Treating Provider/Extender: Skipper Cliche in Treatment: 0 Active Problems ICD-10 Encounter Code Description Active Date MDM Diagnosis L89.613 Pressure ulcer of right heel, stage 3 05/28/2022 No Yes L89.623 Pressure ulcer of left heel, stage 3 05/28/2022 No Yes E11.621 Type 2 diabetes mellitus with foot ulcer 05/28/2022 No Yes R00.1 Bradycardia, unspecified 05/28/2022 No Yes I48.0 Paroxysmal atrial fibrillation 05/28/2022 No Yes I49.5 Sick sinus syndrome 05/28/2022 No Yes Z95.0 Presence of cardiac pacemaker 05/28/2022 No  Yes Inactive Problems Resolved Problems Electronic Signature(s) Signed: 05/28/2022 1:59:42 PM By: Worthy Keeler PA-C Entered By: Worthy Keeler on 05/28/2022 13:59:42 Bour, Lateef ([id number]) -------------------------------------------------------------------------------- Progress Note Details Patient Name: [name] [name] Date of Service: 05/28/2022 1:00 PM Medical Record Number: [id number] Patient Account Number: [id number] Date of Birth/Sex: 19-Dec-1927 (88 y.o. Male) Treating RN: Cornell Barman Primary Care Provider: Alver Fisher Other Clinician: Referring Provider: Alver Fisher Treating Provider/Extender: Skipper Cliche in Treatment: 0 Subjective Chief Complaint Information obtained from Patient Bilateral Heel Pressure Ulcers History of Present Illness (HPI) 05-28-2022 patient presents today for initial inspection here in our clinic concerning wounds that he has had since around the beginning of May based on what we see. The right is larger than the left though  both seem to be doing decently well. He does have somewhat poor perfusion based on what were seen in fact upon evaluation today his pulse despite the fact he has a pacemaker which is post to pacing at 60 bpm he was only running around 35-36 in fact listen to him for 1 full minute with a stethoscope and was only able to get 36 on the count. The monitor which was electronic actually was at 35. With that being said the patient unfortunately is seeming to have some issues with his pacemaker and we did put in a call to [facility]. The patient was actually here for a couple of hours while we were trying to get in touch with him and see what the recommendation was going to be in the interim while he was here we actually ended up getting his pulse back to a normal range as far as the reading was concerned it was about that time that they finally called back after roughly an hour to an hour and a half from what I put in the first call to [facility]. They then recommended that he go back home and hooked up to his monitor they are where they can transmit the data back to the center where they can see what was going on with his pacemaker. In regard to the heels they have actually been using Santyl which I think is actually a very good option. Patient does have a history of diabetes mellitus type 2, bradycardia, atrial fibrillation, sick sinus syndrome, and a cardiac pacemaker. Patient History Allergies Statins-HMG-CoA Reductase Inhibitors (Reaction: palpations), aspirin, Avapro, ACE Inhibitors Social History Former smoker - 1999, Alcohol Use - Never, Drug Use - No History, Caffeine Use - Never. Medical History Hematologic/Lymphatic Patient has history of Anemia - iron deficiency anemia Cardiovascular Patient has history of Arrhythmia - Afib, Hypertension, Peripheral Arterial Disease Endocrine Patient has history of Type II Diabetes Integumentary (Skin) Patient has history of History of pressure  wounds Musculoskeletal Patient has history of Osteomyelitis Patient is treated with Oral Agents. Medical And Surgical History Notes Cardiovascular Pacemaker, SSS Genitourinary Gross Hematuria, AKI, BPH Review of Systems (ROS) Endocrine Complains or has symptoms of Thyroid disease - Hypothyroidism. Integumentary (Skin) Complains or has symptoms of Wounds. Objective Koehn, Burle ([id number]) Constitutional patient is hypertensive.. pulse regular and within target range for patient.Marland Kitchen respirations regular, non-labored and within target range for patient.Marland Kitchen temperature within target range for patient.. Well-nourished and well-hydrated in no acute distress. Vitals Time Taken: 1:12 PM, Temperature: 97.8 F, Pulse: 35 bpm, Respiratory Rate: 16 breaths/min, Blood Pressure: 145/48 mmHg. General Notes: Patient has Psychologist, forensic. Rate should be at 60. Had PA come into room during admission to check behind RN on pulse. Pulse 36  with machine, 35 manual by nurse, 35 when checked by PA. PA placed call into patients cardiologist. Waiting on return call for next steps. Patient states the batteries were changed recently. Eyes conjunctiva clear no eyelid edema noted. pupils equal round and reactive to light and accommodation. Ears, Nose, Mouth, and Throat no gross abnormality of ear auricles or external auditory canals. normal hearing noted during conversation. mucus membranes moist. Respiratory normal breathing without difficulty. Cardiovascular Absent posterior tibial and dorsalis pedis pulses bilateral lower extremities. trace pitting edema of the bilateral lower extremities. Musculoskeletal Patient unable to walk without assistance. Psychiatric this patient is able to make decisions and demonstrates good insight into disease process. Alert and Oriented x 3. pleasant and cooperative. General Notes: Upon inspection patient's wound bed actually showed signs of fairly good granulation there was some  slough and biofilm buildup considering the poor perfusion with a heart rate at 36 in the clinic today I did not want to perform any type of debridement at this point I am not really sure how well he is perfusing to his feet his capillary refill was about 8 seconds which was extremely slow. He does not seem to be symptomatic at all despite the low pulse rate but nonetheless this is still going to be an ongoing issue. He needs to have this checked as his pacemaker should not really be doing this. Integumentary (Hair, Skin) Wound #1 status is Open. Original cause of wound was Gradually Appeared. The date acquired was: 01/15/2022. The wound is located on the Right Calcaneus. The wound measures 5cm length x 2cm width x 0.3cm depth; 7.854cm^2 area and 2.356cm^3 volume. There is Fat Layer (Subcutaneous Tissue) exposed. There is no tunneling noted, however, there is undermining starting at 3:00 and ending at 8:00 with a maximum distance of 1.2cm. There is a medium amount of serous drainage noted. The wound margin is flat and intact. There is small (1-33%) red granulation within the wound bed. There is a large (67-100%) amount of necrotic tissue within the wound bed including Adherent Slough. Wound #2 status is Open. Original cause of wound was Gradually Appeared. The date acquired was: 01/15/2022. The wound is located on the Left Calcaneus. The wound measures 1cm length x 0.9cm width x 0.3cm depth; 0.707cm^2 area and 0.212cm^3 volume. There is Fat Layer (Subcutaneous Tissue) exposed. There is no tunneling or undermining noted. There is a medium amount of serous drainage noted. The wound margin is fibrotic, thickened scar. There is no granulation within the wound bed. There is a large (67-100%) amount of necrotic tissue within the wound bed including Adherent Slough. Assessment Active Problems ICD-10 Pressure ulcer of right heel, stage 3 Pressure ulcer of left heel, stage 3 Type 2 diabetes mellitus with foot  ulcer Bradycardia, unspecified Paroxysmal atrial fibrillation Sick sinus syndrome Presence of cardiac pacemaker Procedures Wound #1 Pre-procedure diagnosis of Wound #1 is a Pressure Ulcer located on the Right Calcaneus .Severity of Tissue Pre Debridement is: Fat layer exposed. There was a Selective/Open Wound Skin/Dermis Debridement with a total area of 2 sq cm performed by Tommie Sams., PA-C. With the following instrument(s): Curette to remove Non-Viable tissue/material. Material removed includes Skin: Dermis. No specimens were taken. A time out was conducted at 14:05, prior to the start of the procedure. There was no bleeding. The procedure was tolerated well. Post Debridement Dipasquale, Jaksen ([id number]) Measurements: 4cm length x 2cm width x 0.3cm depth; 1.885cm^3 volume. Post debridement Stage noted as Category/Stage III. Character of Wound/Ulcer Post  Debridement is stable. Severity of Tissue Post Debridement is: Fat layer exposed. Post procedure Diagnosis Wound #1: Same as Pre-Procedure Wound #2 Pre-procedure diagnosis of Wound #2 is a Pressure Ulcer located on the Left Calcaneus .Severity of Tissue Pre Debridement is: Fat layer exposed. There was a Chemical/Enzymatic/Mechanical debridement performed by Tommie Sams., PA-C. With the following instrument(s): santyl. Agent used was [employer]. A time out was conducted at 14:05, prior to the start of the procedure. There was no bleeding. The procedure was tolerated well. Post Debridement Measurements: 1cm length x 0.9cm width x 0.3cm depth; 0.212cm^3 volume. Post debridement Stage noted as Category/Stage III. Character of Wound/Ulcer Post Debridement is stable. Severity of Tissue Post Debridement is: Fat layer exposed. Post procedure Diagnosis Wound #2: Same as Pre-Procedure Plan Follow-up Appointments: Wound #1 Right Calcaneus: Return Appointment in 2 weeks. Wound #2 Left Calcaneus: Return Appointment in 2 weeks. Home Health: [facility] for wound care. May utilize formulary equivalent dressing for wound treatment orders unless otherwise specified. Home Health Nurse may visit PRN to address patient s wound care needs. Scheduled days for dressing changes to be completed; exception, patient has scheduled wound care visit that day. **Please direct any NON-WOUND related issues/requests for orders to patient's Primary Care Physician. **If current dressing causes regression in wound condition, may D/C ordered dressing product/s and apply Normal Saline Moist Dressing daily until next [place] or Other MD appointment. **Notify Wound Healing Center of regression in wound condition at [phone]. Bathing/ Shower/ Hygiene: Wash wounds with antibacterial soap and water. Off-Loading: Other: WOUND #1: - Calcaneus Wound Laterality: Right Topical: Santyl Collagenase Ointment, 30 (gm), tube 1 x Per Day/30 Days Discharge Instructions: apply nickel thick to wound bed only Primary Dressing: Gauze 1 x Per Day/30 Days Discharge Instructions: As directed: dry, moistened with saline or moistened with Dakins Solution Secondary Dressing: Kerlix 4.5 x 4.1 (in/yd) 1 x Per Day/30 Days Discharge Instructions: Apply Kerlix 4.5 x 4.1 (in/yd) as instructed Secured With: Medipore Tape - 729M Medipore H Soft Cloth Surgical Tape, 2x2 (in/yd) 1 x Per Day/30 Days WOUND #2: - Calcaneus Wound Laterality: Left Topical: Gentamicin (Home Health) 1 x Per Day/30 Days Discharge Instructions: Apply as directed by provider. Topical: Santyl Collagenase Ointment, 30 (gm), tube 1 x Per Day/30 Days Discharge Instructions: apply nickel thick to wound bed only Primary Dressing: Gauze 1 x Per Day/30 Days Discharge Instructions: As directed: dry, moistened with saline or moistened with Dakins Solution Secondary Dressing: Kerlix 4.5 x 4.1 (in/yd) 1 x Per Day/30 Days Discharge Instructions: Apply Kerlix 4.5 x 4.1 (in/yd) as instructed Secured With:  Medipore Tape - 729M Medipore H Soft Cloth Surgical Tape, 2x2 (in/yd) 1 x Per Day/30 Days 1. Based on what I am seeing today my suggestion with regard to his wounds is to initiate a continuation of treatment with the Santyl daily which I think is the best way to go. 2. Also can recommend that we have the patient continue with a saline moistened gauze, and then roll gauze to secure in place. 3. I am also going to suggest the patient should continue to be monitored for any signs of worsening infection if anything changes he should let us know. 4. In regard to his pacemaker this was obviously not functioning properly while he was here in the clinic. We actually monitor him for about an hour and a half today and while we were getting in touch with [facility] at the clinic that installed the pacemaker. Subsequently  by the time we finally got a call back from them we have already confirmed that he had gone back into a rhythm of 60 bpm which is where he is supposed to be paced at. Nonetheless I think there might be something going on here we explained that to the nurse practitioner who called and her advice was to have the patient hooked up to the connecting monitor in his room to be able to transmit data back to them at [facility] he is going to do that as soon as he gets home we told the transportation individual that is carried the patient back and we also told the patient himself he is very aware and knows what is going on he tells me that he would definitely do so. We will see patient back for reevaluation in 2 weeks here in the clinic. If anything worsens or changes patient will contact our office for additional recommendations. Electronic Signature(s) [place], [place] ([id number]) Signed: 05/28/2022 4:51:52 PM By: Lenda Kelp PA-C Entered By: Lenda Kelp on 05/28/2022 16:51:52 Math, Mattias ([id number]) -------------------------------------------------------------------------------- ROS/PFSH Details Patient  Name: Callie Fielding Date of Service: 05/28/2022 1:00 PM Medical Record Number: [id number] Patient Account Number: [id number] Date of Birth/Sex: Sep 21, 1927 (88 y.o. Male) Treating RN: Huel Coventry Primary Care Provider: Ilsa Iha Other Clinician: Referring Provider: Ilsa Iha Treating Provider/Extender: Rowan Blase in Treatment: 0 Endocrine Complaints and Symptoms: Positive for: Thyroid disease - Hypothyroidism Medical History: Positive for: Type II Diabetes Treated with: Oral agents Integumentary (Skin) Complaints and Symptoms: Positive for: Wounds Medical History: Positive for: History of pressure wounds Hematologic/Lymphatic Medical History: Positive for: Anemia - iron deficiency anemia Cardiovascular Medical History: Positive for: Arrhythmia - Afib; Hypertension; Peripheral Arterial Disease Past Medical History Notes: Pacemaker, SSS Genitourinary Medical History: Past Medical History Notes: Gross Hematuria, AKI, BPH Musculoskeletal Medical History: Positive for: Osteomyelitis Immunizations Pneumococcal Vaccine: Received Pneumococcal Vaccination: Yes Received Pneumococcal Vaccination On or After 60th Birthday: Yes Implantable Devices Yes Family and Social History Former smoker - 1999; Alcohol Use: Never; Drug Use: No History; Caffeine Use: Never Electronic Signature(s) Signed: 05/28/2022 5:08:52 PM By: Elliot Gurney, BSN, RN, CWS, Kim RN, BSN Signed: 05/29/2022 5:16:25 PM By: Lenda Kelp PA-C Entered By: Elliot Gurney, BSN, RN, CWS, Kim on 05/28/2022 13:25:54 Martone, Chaun ([id number]) [place], [place] ([id number]) -------------------------------------------------------------------------------- SuperBill Details Patient Name: Callie Fielding Date of Service: 05/28/2022 Medical Record Number: [id number] Patient Account Number: [id number] Date of Birth/Sex: 1928/06/07 (88 y.o. Male) Treating RN: Huel Coventry Primary Care Provider: Ilsa Iha Other Clinician: Referring Provider:  Ilsa Iha Treating Provider/Extender: Rowan Blase in Treatment: 0 Diagnosis Coding ICD-10 Codes Code Description [phone] Pressure ulcer of right heel, stage 3 L89.623 Pressure ulcer of left heel, stage 3 E11.621 Type 2 diabetes mellitus with foot ulcer R00.1 Bradycardia, unspecified I48.0 Paroxysmal atrial fibrillation I49.5 Sick sinus syndrome Z95.0 Presence of cardiac pacemaker Facility Procedures CPT4 Code: 01601093 Description: 99213 - WOUND CARE VISIT-LEV 3 EST PT Modifier: Quantity: 1 CPT4 Code: 23557322 Description: 97597 - DEBRIDE WOUND 1ST 20 SQ CM OR < Modifier: Quantity: 1 CPT4 Code: Description: ICD-10 Diagnosis Description L89.613 Pressure ulcer of right heel, stage 3 Modifier: Quantity: Physician Procedures CPT4 Code: 0254270 Description: 99204 - WC PHYS LEVEL 4 - NEW PT Modifier: 25 Quantity: 1 CPT4 Code: Description: ICD-10 Diagnosis Description L89.613 Pressure ulcer of right heel, stage 3 L89.623 Pressure ulcer of left heel, stage 3 E11.621 Type 2 diabetes mellitus with foot ulcer R00.1 Bradycardia, unspecified Modifier: Quantity: CPT4 Code: 6237628 Description: 31517 -  WC PHYS DEBR WO ANESTH 20 SQ CM Modifier: Quantity: 1 CPT4 Code: Description: ICD-10 Diagnosis Description L89.613 Pressure ulcer of right heel, stage 3 Modifier: Quantity: Electronic Signature(s) Signed: 05/28/2022 4:45:48 PM By: Lenda Kelp PA-C Entered By: Lenda Kelp on 05/28/2022 16:45:48

## 2022-06-06 ENCOUNTER — Other Ambulatory Visit: Payer: Medicare Other | Admitting: Primary Care

## 2022-06-06 DIAGNOSIS — I495 Sick sinus syndrome: Secondary | ICD-10-CM

## 2022-06-06 DIAGNOSIS — L89610 Pressure ulcer of right heel, unstageable: Secondary | ICD-10-CM

## 2022-06-06 DIAGNOSIS — I482 Chronic atrial fibrillation, unspecified: Secondary | ICD-10-CM

## 2022-06-06 DIAGNOSIS — Z515 Encounter for palliative care: Secondary | ICD-10-CM

## 2022-06-06 DIAGNOSIS — R5381 Other malaise: Secondary | ICD-10-CM

## 2022-06-06 NOTE — Progress Notes (Signed)
    [facility] Consult Note Telephone: [phone]  Fax: [phone]    Date of encounter: 06/06/22 PATIENT NAME: [name] [name] [id number]   [phone] (home)  DOB: [birth year] MRN: [id number] PRIMARY CARE PROVIDER:    Belva Agee, NP,  [place] [address] [phone]  REFERRING PROVIDER:   Belva Agee, NP [address] [place],   [address] [phone]  RESPONSIBLE PARTY:    Contact Information     Name Relation Home Work Mobile   [name] [name] Daughter [phone]  [phone]   [name] [name]  [phone]         Due to the COVID-19 crisis, this visit was done via telemedicine from my office and it was initiated and consent by this patient and or family.  I connected with  [name] [name] OR PROXY on 06/06/22 by a audio enabled telemedicine application and verified that I am speaking with the correct person using two identifiers.   I discussed the limitations of evaluation and management by telemedicine. The patient expressed understanding and agreed to proceed. Palliative Care was asked to follow this patient by consultation request of  Belva Agee, NP to address advance care planning.                                   ASSESSMENT AND PLAN / RECOMMENDATIONS:   Advance Care Planning/Goals of Care: Goals include to maximize quality of life and symptom management. Patient/health care surrogate gave his/her permission to discuss.Our advance care planning conversation included a discussion about:    The value and importance of advance care planning  Experiences with loved ones who have been seriously ill or have died  Exploration of personal, cultural or spiritual beliefs that might influence medical decisions  Exploration of goals of care in the event of a sudden injury or illness  Identification and preparation of a healthcare agent  Review  of  an  advance directive document - DNR, comfort, no hospital Decision to de-escalate disease focused treatments due to poor prognosis. CODE STATUS: DNR Daughter reached out RE changing to hospice services at this time. Patient is declining in function  and good and bad days but overall is declining in ability for self care and cognition Patient  had gone off hospice previously, and had a procedure. However, the outcomes has not changed as far as the disease process progression. Discussed hospice services including inpatient, and reimbursement models. Daughter requests referral to [facility] hospice, and I will work to obtain.  Follow up Palliative Care Visit: Refer to hospice  I spent 30 minutes providing this consultation. More than 50% of the time in this consultation was spent in counseling and care coordination.  Thank you for the opportunity to participate in the care of [name] [name].  The palliative care team will continue to follow. Please call our office at [phone] if we can be of additional assistance.   Jason Coop DNP, MPH, AGPCNP-BC, [facility]

## 2022-06-12 ENCOUNTER — Other Ambulatory Visit: Payer: Medicare Other | Admitting: Primary Care

## 2022-06-17 DEATH — deceased

## 2022-06-18 ENCOUNTER — Ambulatory Visit: Payer: Medicare Other | Admitting: Physician Assistant

## 2023-06-05 IMAGING — CT CT ABD-PELV W/O CM
2 of 4 series · 16 of 46 positions shown, 18 images · non-contrast
Comparison: 09/10/2020

CLINICAL DATA: Abdominal distension

EXAM:
CT ABDOMEN AND PELVIS WITHOUT CONTRAST
TECHNIQUE: Multidetector CT imaging of the abdomen and pelvis was performed
following the standard protocol without IV contrast.

[Series 2: axial st · axial · 0.80mm/px · z∈[-1223,-783]mm · 13 of 98 slices shown, 15 images]
[im 5/98  soft-tissue]
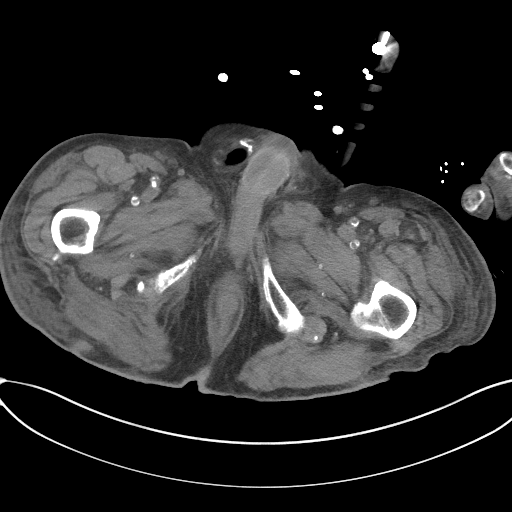
[im 5/98  bone]
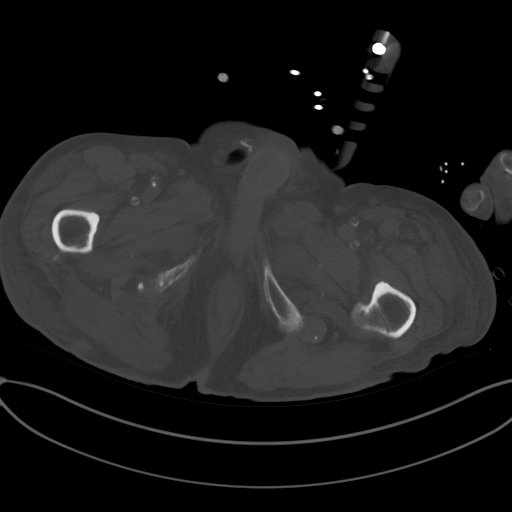
[im 13/98  soft-tissue]
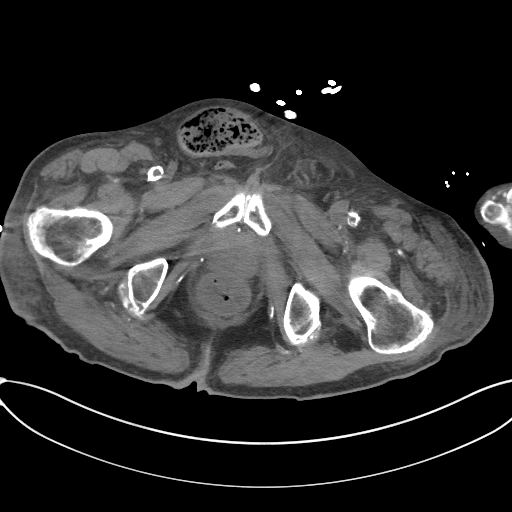
[im 22/98  soft-tissue]
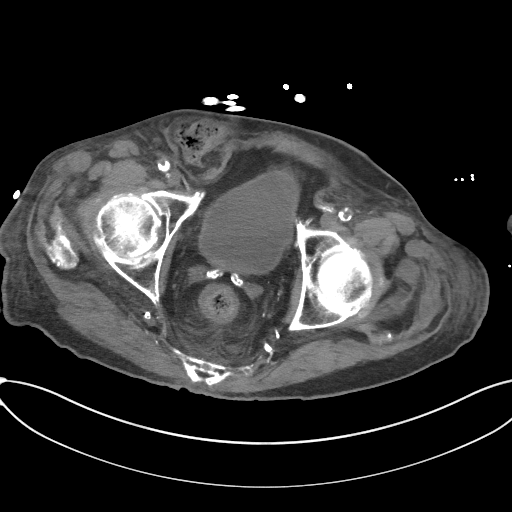
[im 26/98  soft-tissue]
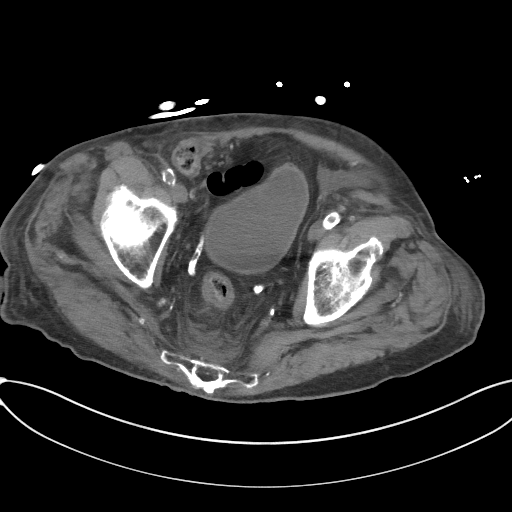
[im 34/98  soft-tissue]
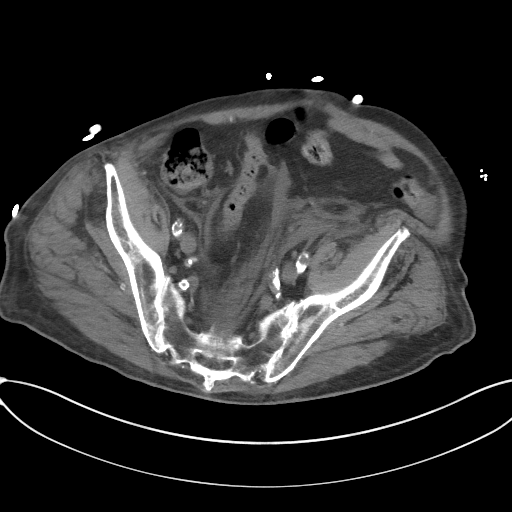
[im 43/98  soft-tissue]
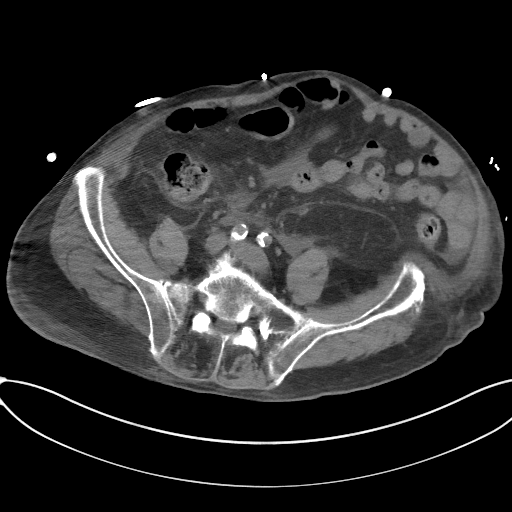
[im 51/98  soft-tissue]
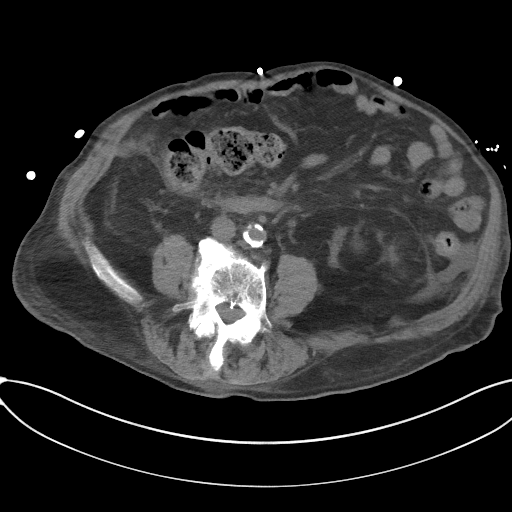
[im 55/98  soft-tissue]
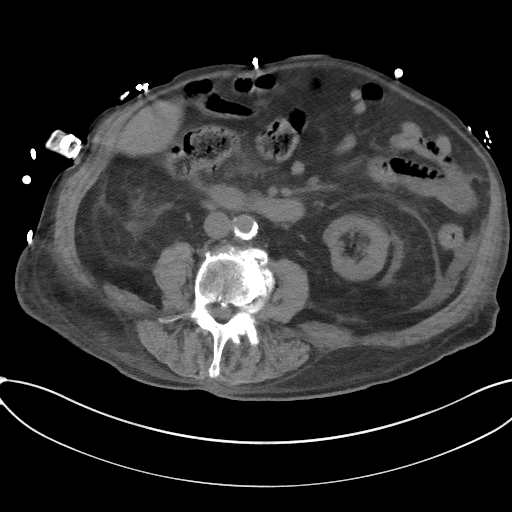
[im 64/98  soft-tissue]
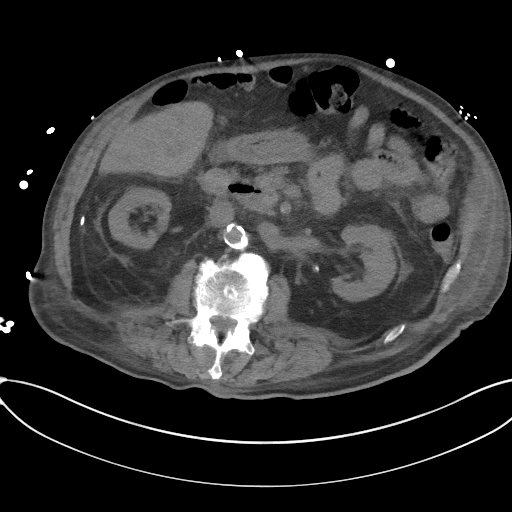
[im 64/98  bone]
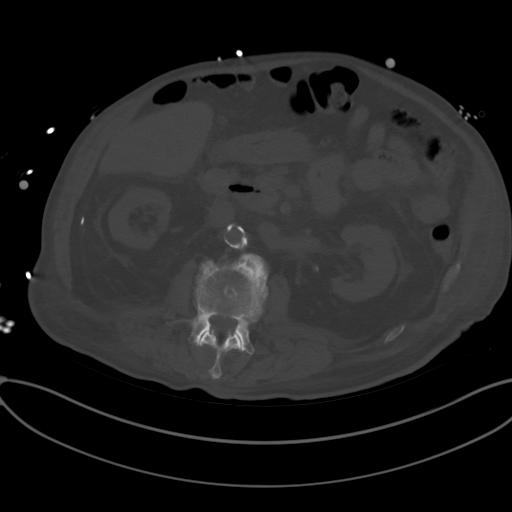
[im 72/98  soft-tissue]
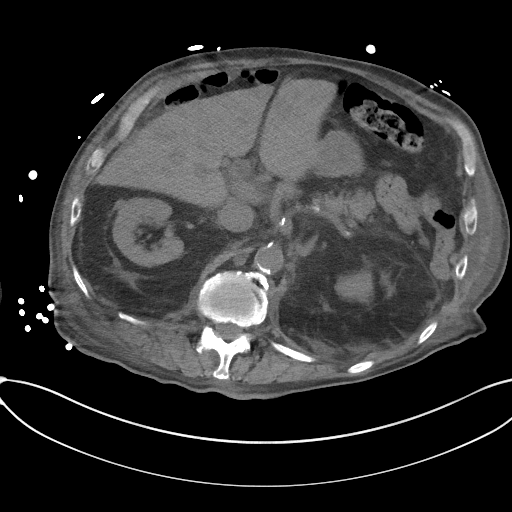
[im 76/98  soft-tissue]
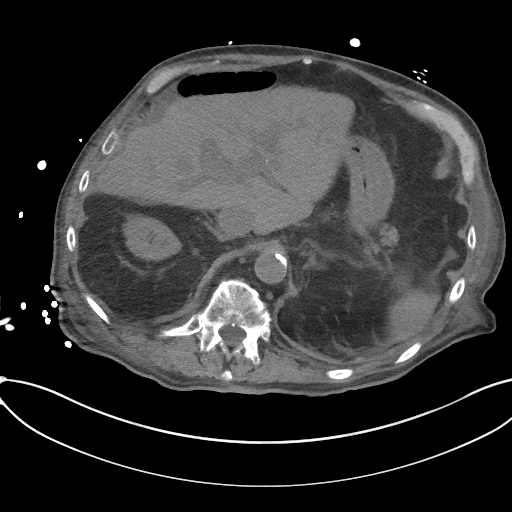
[im 85/98  soft-tissue]
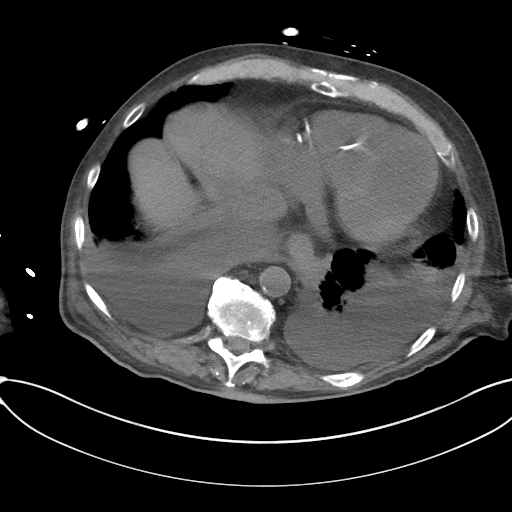
[im 93/98  soft-tissue]
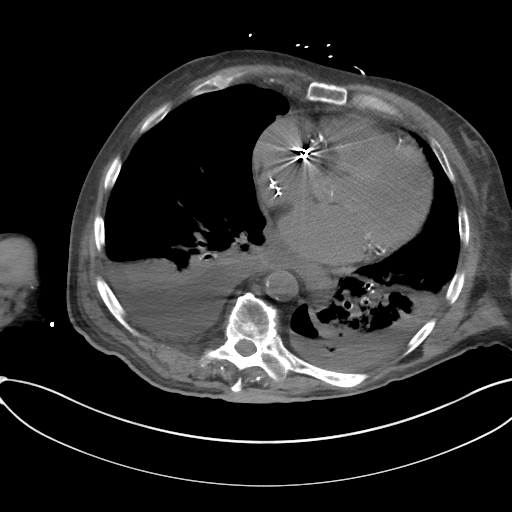

[Series 5: coronal st · coronal · 0.86mm/px · 3 of 95 slices shown]
[im 32/95  soft-tissue]
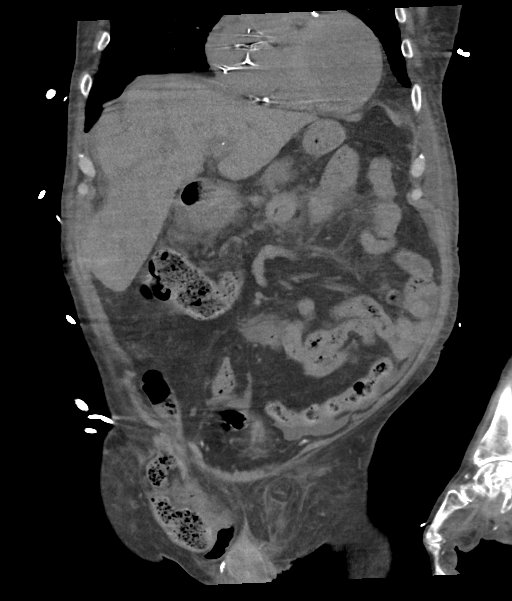
[im 42/95  soft-tissue]
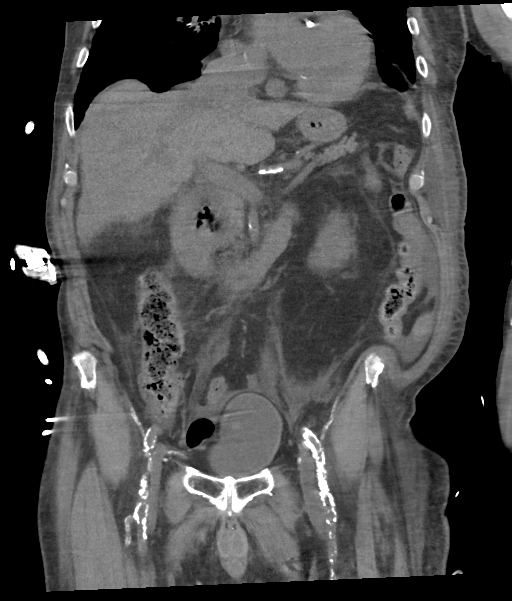
[im 53/95  soft-tissue]
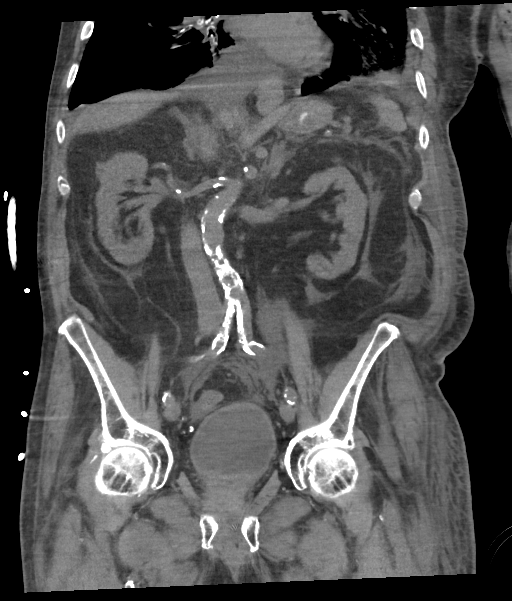

[16 of 46 positions shown; findings below may reference images not displayed]

FINDINGS: Lower chest: Bilateral pleural effusions are noted right greater
than left. Considerable material is noted within the lower lobe
bronchial tree with associated patchy areas of consolidation. These
changes could represent bilateral aspiration. Coronary
calcifications are noted.

Hepatobiliary: No focal liver abnormality is seen. Status post
cholecystectomy. No biliary dilatation.

Pancreas: Unremarkable. No pancreatic ductal dilatation or
surrounding inflammatory changes.

Spleen: Normal in size without focal abnormality.

Adrenals/Urinary Tract: Adrenal glands are within normal limits
bilaterally. Kidneys are well visualized bilaterally without renal
calculi or obstructive changes. The ureters are within normal
limits. The bladder is partially distended. Hypodensities are noted
within the left kidney likely representing cysts. This is stable
from the prior exam.

Stomach/Bowel: Circumferential wall thickening is noted within the
rectum which may represent some focal proctitis. No perforation is
noted. The more proximal colon is within normal limits. The cecum
extends inferiorly into a right inguinal hernia along with a loop of
terminal ileum. No obstructive changes are seen. The more proximal
small bowel is within normal limits with the exception of a small
duodenal diverticulum adjacent to the head of pancreas. Stomach is
decompressed. Appendix has been surgically removed

Vascular/Lymphatic: Aortic atherosclerosis. No enlarged abdominal or
pelvic lymph nodes.

Reproductive: Prostate is unremarkable.

Other: No abdominal wall hernia or abnormality. No abdominopelvic
ascites.

Musculoskeletal: Degenerative changes of the lumbar spine are noted.
IMPRESSION: Right inguinal hernia containing a loop of terminal ileum as well as
the cecum. No associated obstruction is noted.

Circumferential wall thickening within the rectum which may
represent some mild proctitis.

Bilateral pleural effusions with associated lower lobe
consolidation. Increased soft tissue density is noted within the
lower lobe bronchial tree bilaterally which may represent bilateral
aspiration.

## 2023-06-06 IMAGING — US US RENAL
1 series · 14 of 25 positions shown · non-contrast
Comparison: CT abdomen/pelvis dated 08/19/2021

CLINICAL DATA: Hematuria

EXAM:
RENAL / URINARY TRACT ULTRASOUND COMPLETE

[Series 1: us renal · 14 of 77 slices shown]
[im 1/77]
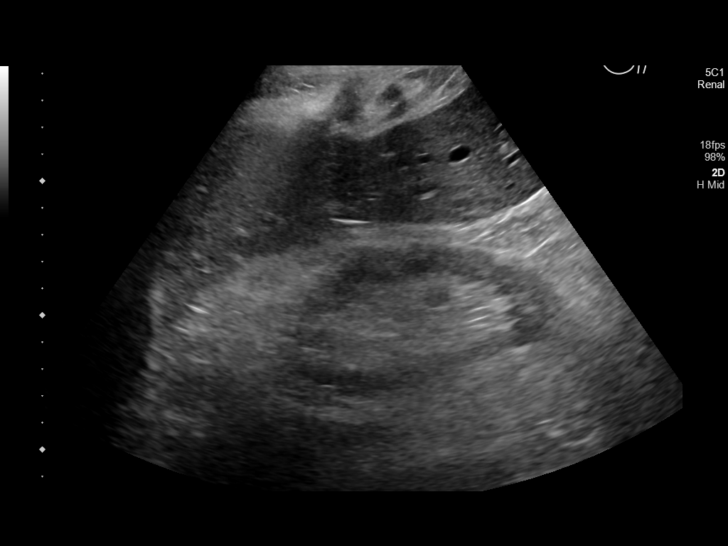
[im 7/77]
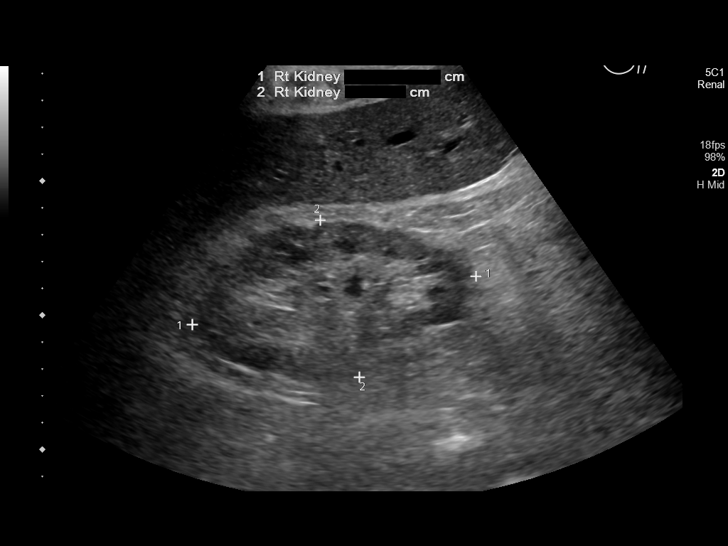
[im 13/77]
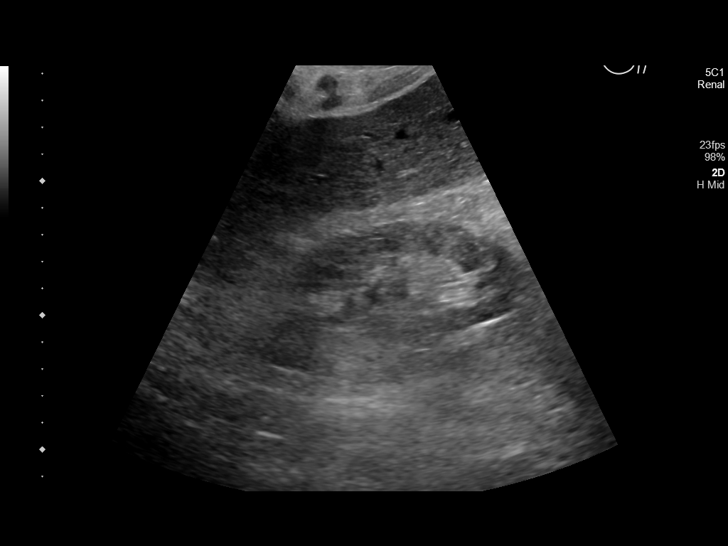
[im 20/77]
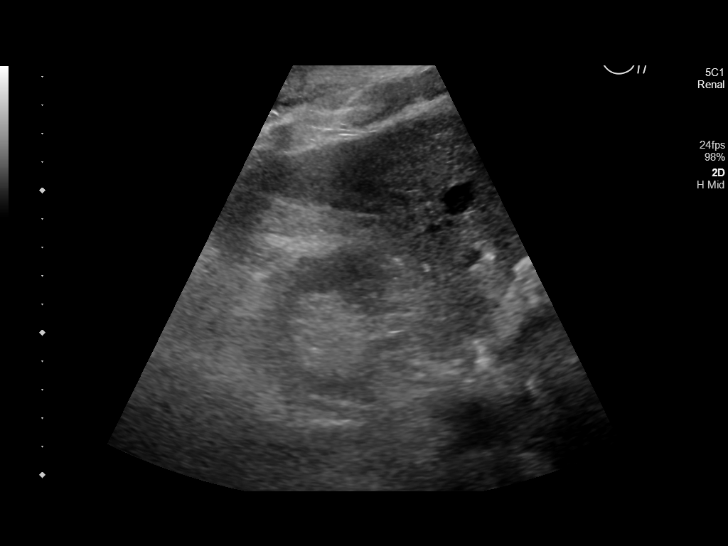
[im 26/77]
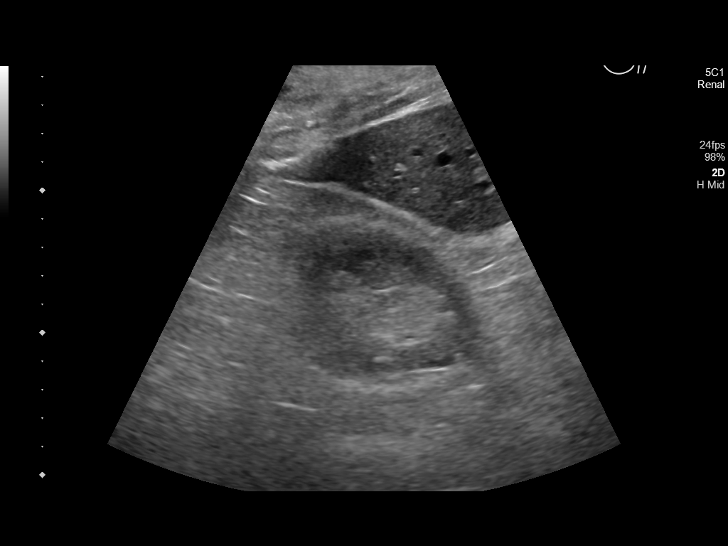
[im 29/77]
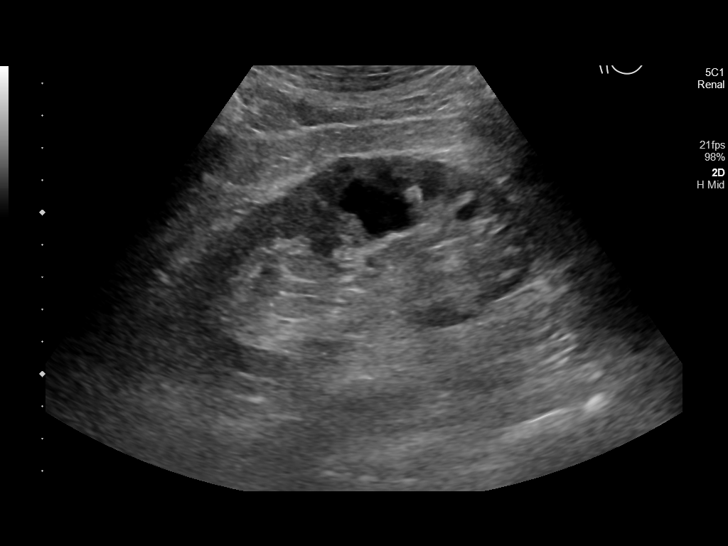
[im 35/77]
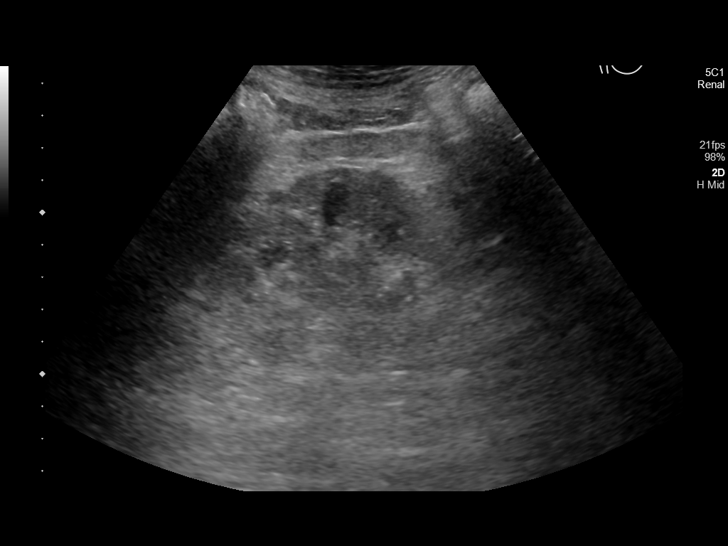
[im 42/77]
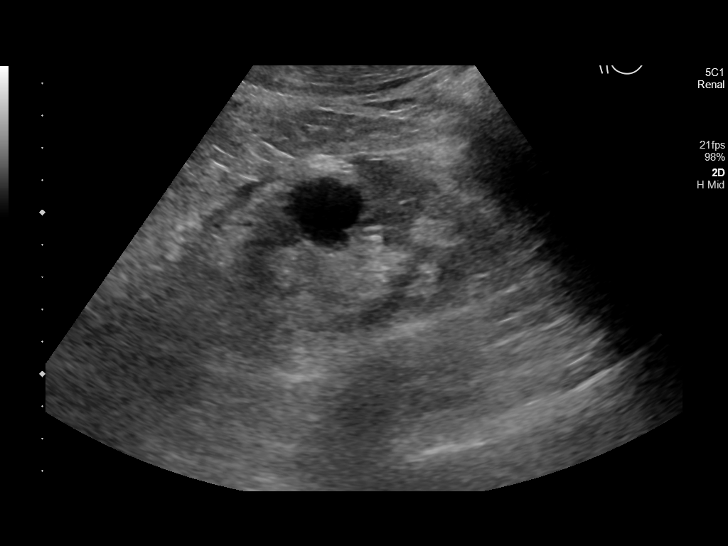
[im 48/77]
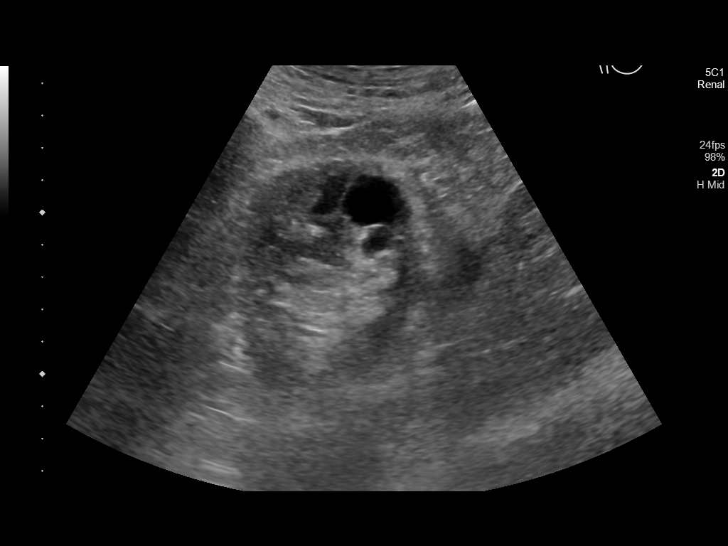
[im 51/77]
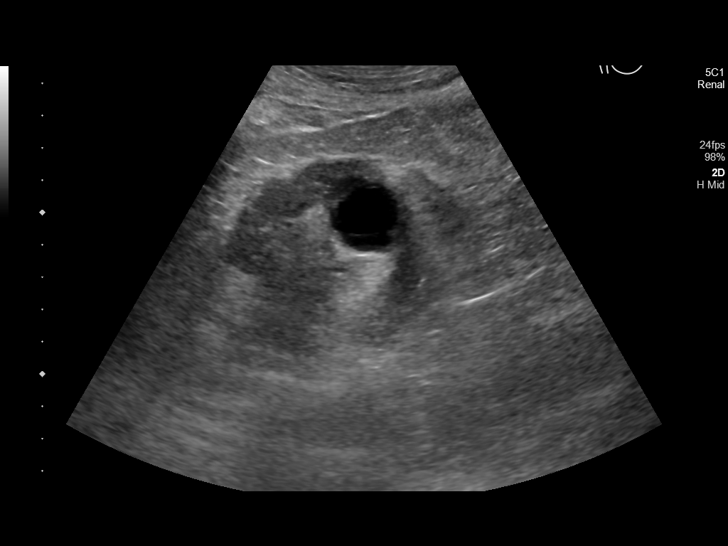
[im 58/77]
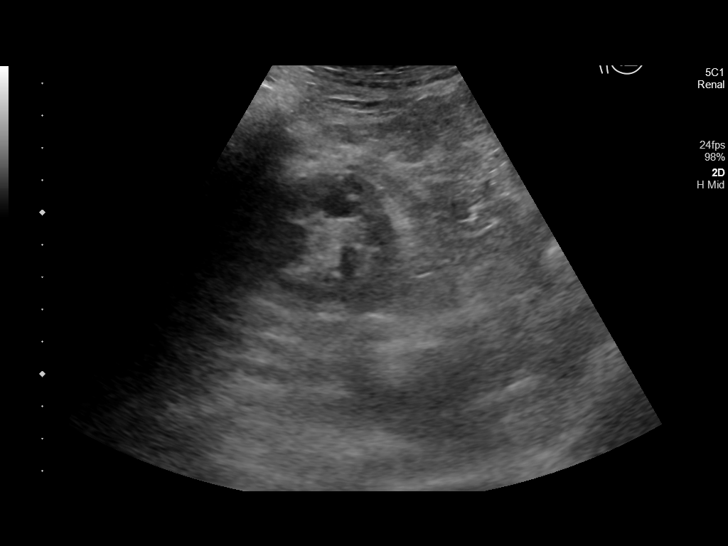
[im 64/77]
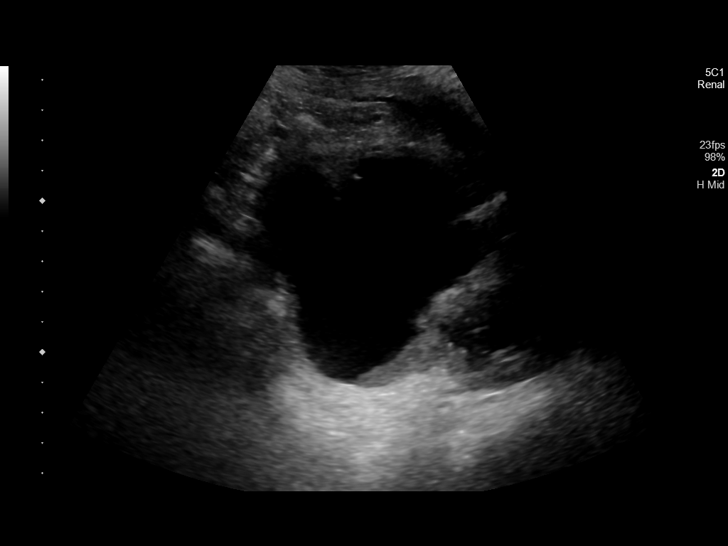
[im 70/77]
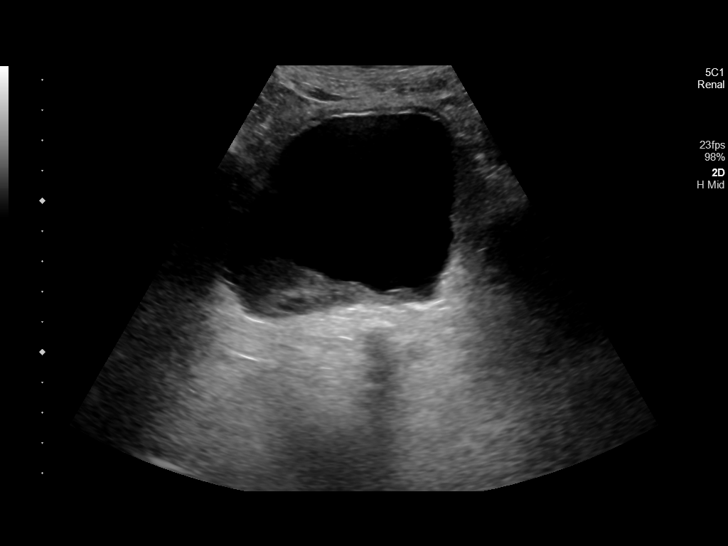
[im 77/77]
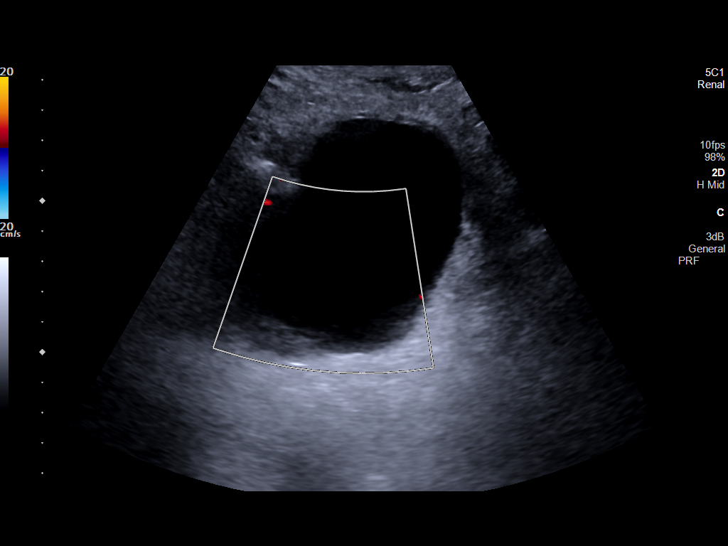

[14 of 25 positions shown; findings below may reference images not displayed]

FINDINGS: Right Kidney:

Renal measurements: 10.7 x 6.0 x 5.8 cm = volume: 196 mL. Echogenic
renal parenchyma. No mass or hydronephrosis visualized.

Left Kidney:

Renal measurements: 11.2 x 6.8 x 6.5 cm = volume: 257 mL. Echogenic
renal parenchyma. 3.0 x 2.3 x 2.5 cm interpolar cyst, simple. No
hydronephrosis.

Bladder:

Layering debris in the bladder.

Other:

None.
IMPRESSION: Layering debris in the bladder.

No hydronephrosis.

3.0 cm simple left renal cyst, benign.

## 2023-06-29 IMAGING — DX DG CHEST 1V PORT
1 series · 1 of 1 positions shown · non-contrast
Comparison: 08/18/2021

CLINICAL DATA: Shortness of breath

EXAM:
PORTABLE CHEST 1 VIEW

[chest ap]
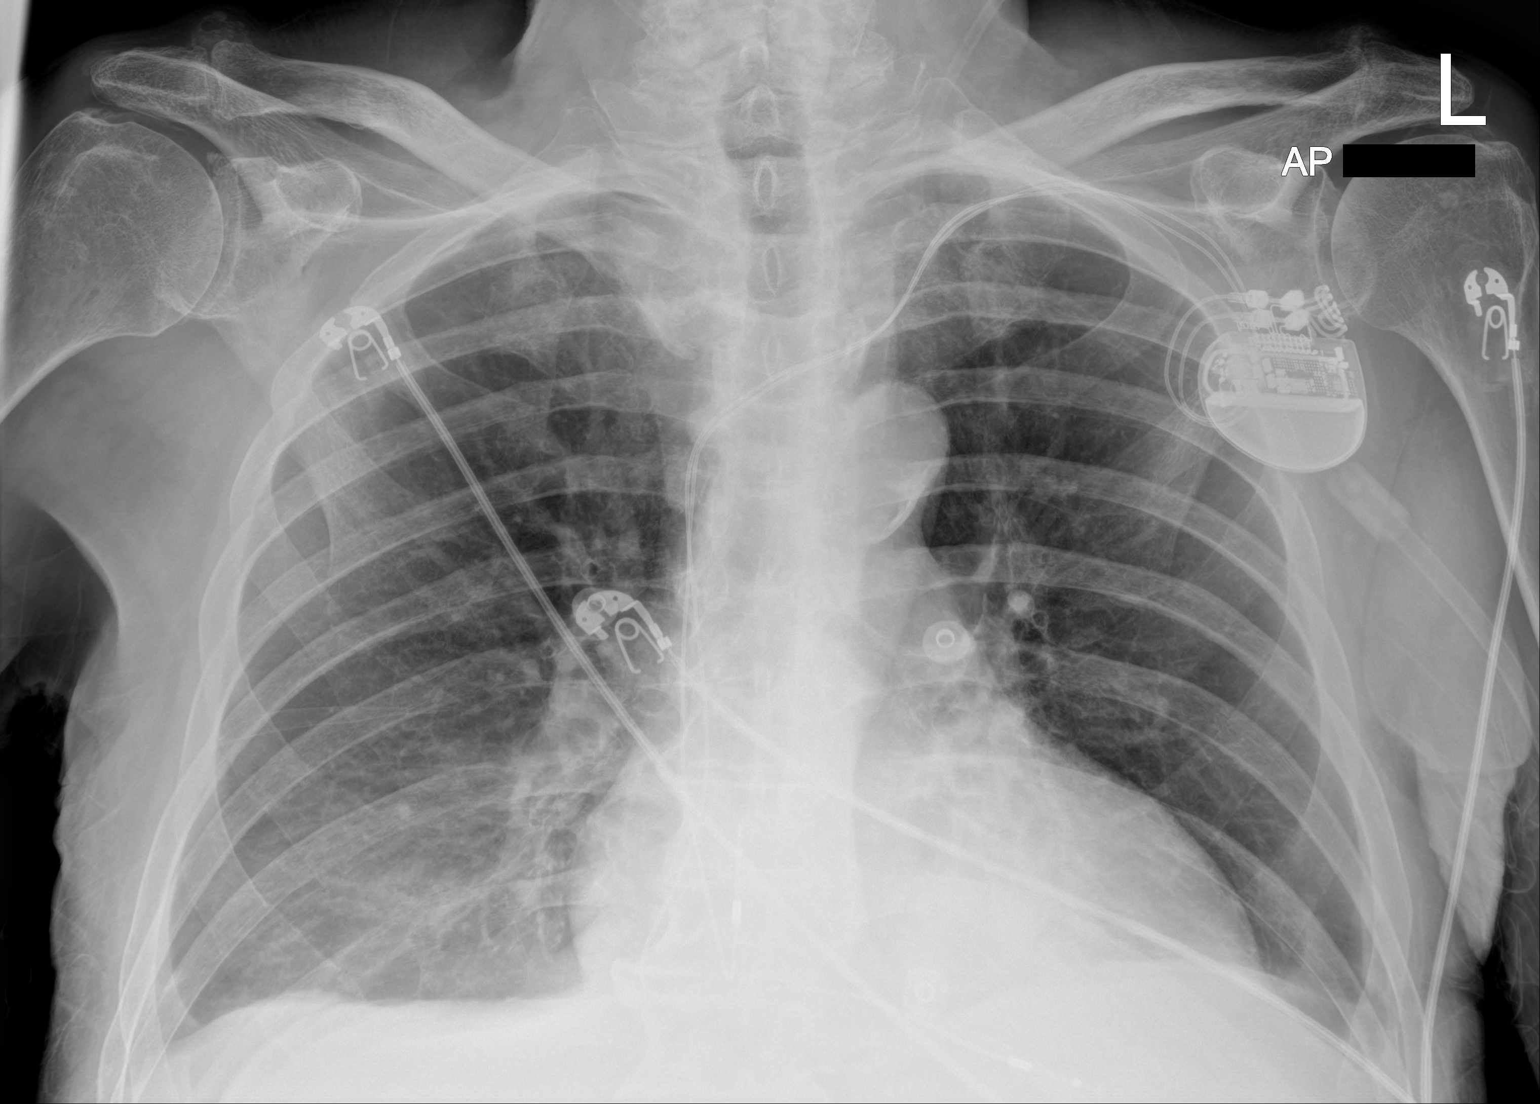

[1 of 1 positions shown; findings below may reference images not displayed]

FINDINGS: Left-sided pacing device as before. Small bilateral effusions with
hazy bibasilar opacity. Stable cardiomediastinal silhouette with
aortic atherosclerosis.
IMPRESSION: 1. Small bilateral effusions with hazy atelectasis or pneumonia at
the bases.
2. Mild cardiomegaly
